# Patient Record
Sex: Female | Born: 1970 | Race: White | Hispanic: No | Marital: Married | Smoking: Former smoker
Health system: Southern US, Community
[De-identification: ages and names within clinical notes are randomized; demographics above are authoritative.]

## PROBLEM LIST (undated history)

## (undated) DIAGNOSIS — E669 Obesity, unspecified: Secondary | ICD-10-CM

## (undated) DIAGNOSIS — Q433 Congenital malformations of intestinal fixation: Secondary | ICD-10-CM

## (undated) DIAGNOSIS — N801 Endometriosis of ovary: Secondary | ICD-10-CM

## (undated) DIAGNOSIS — S83209A Unspecified tear of unspecified meniscus, current injury, unspecified knee, initial encounter: Secondary | ICD-10-CM

## (undated) DIAGNOSIS — J329 Chronic sinusitis, unspecified: Secondary | ICD-10-CM

## (undated) DIAGNOSIS — Q893 Situs inversus: Secondary | ICD-10-CM

## (undated) DIAGNOSIS — F341 Dysthymic disorder: Secondary | ICD-10-CM

## (undated) DIAGNOSIS — K219 Gastro-esophageal reflux disease without esophagitis: Secondary | ICD-10-CM

## (undated) DIAGNOSIS — T7840XA Allergy, unspecified, initial encounter: Secondary | ICD-10-CM

## (undated) DIAGNOSIS — J45909 Unspecified asthma, uncomplicated: Secondary | ICD-10-CM

## (undated) DIAGNOSIS — N80109 Endometriosis of ovary, unspecified side, unspecified depth: Secondary | ICD-10-CM

## (undated) DIAGNOSIS — G629 Polyneuropathy, unspecified: Secondary | ICD-10-CM

## (undated) HISTORY — DX: Dysthymic disorder: F34.1

## (undated) HISTORY — DX: Polyneuropathy, unspecified: G62.9

## (undated) HISTORY — DX: Unspecified asthma, uncomplicated: J45.909

## (undated) HISTORY — DX: Unspecified tear of unspecified meniscus, current injury, unspecified knee, initial encounter: S83.209A

## (undated) HISTORY — DX: Endometriosis of ovary, unspecified side, unspecified depth: N80.109

## (undated) HISTORY — DX: Gastro-esophageal reflux disease without esophagitis: K21.9

## (undated) HISTORY — DX: Obesity, unspecified: E66.9

## (undated) HISTORY — DX: Allergy, unspecified, initial encounter: T78.40XA

## (undated) HISTORY — DX: Chronic sinusitis, unspecified: J32.9

## (undated) HISTORY — DX: Situs inversus: Q89.3

## (undated) HISTORY — DX: Endometriosis of ovary: N80.1

## (undated) HISTORY — PX: NASAL SINUS SURGERY: SHX719

## (undated) HISTORY — DX: Congenital malformations of intestinal fixation: Q43.3

---

## 2004-03-10 ENCOUNTER — Inpatient Hospital Stay: Payer: Self-pay | Admitting: Obstetrics and Gynecology

## 2005-04-03 HISTORY — PX: ABDOMINAL HYSTERECTOMY: SHX81

## 2006-11-28 ENCOUNTER — Ambulatory Visit: Payer: Self-pay | Admitting: Internal Medicine

## 2007-08-12 ENCOUNTER — Ambulatory Visit: Payer: Self-pay | Admitting: Specialist

## 2008-09-22 ENCOUNTER — Emergency Department: Payer: Self-pay | Admitting: Emergency Medicine

## 2010-02-03 ENCOUNTER — Ambulatory Visit: Payer: Self-pay

## 2011-06-11 ENCOUNTER — Ambulatory Visit: Payer: Self-pay | Admitting: Internal Medicine

## 2011-08-08 ENCOUNTER — Ambulatory Visit: Payer: Self-pay

## 2011-10-28 ENCOUNTER — Ambulatory Visit: Payer: Self-pay | Admitting: Emergency Medicine

## 2012-04-13 ENCOUNTER — Emergency Department: Payer: Self-pay | Admitting: Emergency Medicine

## 2012-04-13 LAB — COMPREHENSIVE METABOLIC PANEL
Alkaline Phosphatase: 86 U/L (ref 50–136)
Anion Gap: 9 (ref 7–16)
BUN: 8 mg/dL (ref 7–18)
Calcium, Total: 8.6 mg/dL (ref 8.5–10.1)
Chloride: 107 mmol/L (ref 98–107)
Co2: 27 mmol/L (ref 21–32)
Creatinine: 0.8 mg/dL (ref 0.60–1.30)
EGFR (African American): >60
Glucose: 95 mg/dL (ref 65–99)
Osmolality: 283 (ref 275–301)
SGPT (ALT): 39 U/L (ref 12–78)
Total Protein: 6.8 g/dL (ref 6.4–8.2)

## 2012-04-13 LAB — CBC
HCT: 40.8 % (ref 35.0–47.0)
HGB: 13.9 g/dL (ref 12.0–16.0)
MCHC: 33.9 g/dL (ref 32.0–36.0)
MCV: 82 fL (ref 80–100)
Platelet: 235 10*3/uL (ref 150–440)
RBC: 5 10*6/uL (ref 3.80–5.20)
RDW: 13.2 % (ref 11.5–14.5)
WBC: 4.3 10*3/uL (ref 3.6–11.0)

## 2012-09-23 ENCOUNTER — Emergency Department: Payer: Self-pay | Admitting: Emergency Medicine

## 2013-03-20 ENCOUNTER — Ambulatory Visit: Payer: Self-pay | Admitting: Physician Assistant

## 2013-05-01 ENCOUNTER — Ambulatory Visit: Payer: Self-pay | Admitting: Physician Assistant

## 2013-10-22 ENCOUNTER — Ambulatory Visit: Payer: Self-pay | Admitting: Physician Assistant

## 2013-11-01 ENCOUNTER — Ambulatory Visit: Payer: Self-pay | Admitting: Family Medicine

## 2014-07-22 DIAGNOSIS — G43719 Chronic migraine without aura, intractable, without status migrainosus: Secondary | ICD-10-CM | POA: Insufficient documentation

## 2014-07-22 DIAGNOSIS — Z6841 Body Mass Index (BMI) 40.0 and over, adult: Secondary | ICD-10-CM

## 2014-07-22 DIAGNOSIS — M797 Fibromyalgia: Secondary | ICD-10-CM | POA: Insufficient documentation

## 2014-11-07 ENCOUNTER — Other Ambulatory Visit: Payer: Self-pay | Admitting: Unknown Physician Specialty

## 2014-11-23 DIAGNOSIS — N801 Endometriosis of ovary: Secondary | ICD-10-CM | POA: Insufficient documentation

## 2014-11-23 DIAGNOSIS — T7840XA Allergy, unspecified, initial encounter: Secondary | ICD-10-CM | POA: Insufficient documentation

## 2014-11-23 DIAGNOSIS — F341 Dysthymic disorder: Secondary | ICD-10-CM | POA: Insufficient documentation

## 2014-11-23 DIAGNOSIS — K219 Gastro-esophageal reflux disease without esophagitis: Secondary | ICD-10-CM | POA: Insufficient documentation

## 2014-11-23 DIAGNOSIS — Q893 Situs inversus: Secondary | ICD-10-CM | POA: Insufficient documentation

## 2014-11-23 DIAGNOSIS — J45909 Unspecified asthma, uncomplicated: Secondary | ICD-10-CM | POA: Insufficient documentation

## 2014-11-23 DIAGNOSIS — G629 Polyneuropathy, unspecified: Secondary | ICD-10-CM | POA: Insufficient documentation

## 2014-11-23 DIAGNOSIS — N80109 Endometriosis of ovary, unspecified side, unspecified depth: Secondary | ICD-10-CM | POA: Insufficient documentation

## 2014-11-30 ENCOUNTER — Encounter: Payer: Self-pay | Admitting: Family Medicine

## 2014-12-29 ENCOUNTER — Encounter: Payer: Self-pay | Admitting: Emergency Medicine

## 2014-12-29 ENCOUNTER — Ambulatory Visit
Admission: EM | Admit: 2014-12-29 | Discharge: 2014-12-29 | Disposition: A | Discharge status: Home / Self Care | Payer: BLUE CROSS/BLUE SHIELD | Attending: Family Medicine | Admitting: Family Medicine

## 2014-12-29 ENCOUNTER — Ambulatory Visit: Payer: BLUE CROSS/BLUE SHIELD

## 2014-12-29 DIAGNOSIS — M7552 Bursitis of left shoulder: Secondary | ICD-10-CM

## 2014-12-29 DIAGNOSIS — M7522 Bicipital tendinitis, left shoulder: Secondary | ICD-10-CM | POA: Diagnosis not present

## 2014-12-29 MED ORDER — MELOXICAM 15 MG PO TABS: 15.0000 mg | ORAL_TABLET | Freq: Every day | ORAL | Status: DC

## 2014-12-29 MED ORDER — TRAMADOL HCL 50 MG PO TABS: 50.0000 mg | ORAL_TABLET | Freq: Two times a day (BID) | ORAL | Status: DC | PRN

## 2014-12-29 MED ORDER — KETOROLAC TROMETHAMINE 60 MG/2ML IM SOLN
60.0000 mg | Freq: Once | INTRAMUSCULAR | Status: AC
  Administered 2014-12-29: 60 mg via INTRAMUSCULAR

## 2014-12-29 NOTE — ED Provider Notes (Signed)
CSN: [id number]     Arrival date & time 12/29/14  1722 History   First MD Initiated Contact with Patient 12/29/14 1751     Chief Complaint  Patient presents with  . Shoulder Pain     Patient reports waking up on Saturday morning with some right shoulder pain and discomfort. She went to a wedding and later socialized with friends and play pool. Saturday the left shoulder was aching and throbbing and she was in severe pain. She states Sunday night she was tempted to go to the emergency room and suffered through Monday and finally came in today. Continue ibuprofen but hasn't helped. She is receiving her back is vascular better now. (Consider location/radiation/quality/duration/timing/severity/associated sxs/prior Treatment) Patient is a 44 y.o. female presenting with shoulder pain. The history is provided by the patient. No language interpreter was used.  Shoulder Pain Location:  Shoulder (scapular pain as well) Time since incident: 4. Injury: no   Shoulder location:  L shoulder Pain details:    Quality:  Aching, burning, shooting, tearing, throbbing and sharp   Radiates to:  L shoulder and back   Severity:  Moderate   Onset quality:  Gradual   Duration:  4 days   Timing:  Constant   Progression:  Worsening Chronicity:  New Handedness:  Right-handed Dislocation: no   Foreign body present:  No foreign bodies Prior injury to area:  No Relieved by:  Nothing Ineffective treatments:  NSAIDs Associated symptoms: neck pain   Associated symptoms: no back pain   Risk factors: no concern for non-accidental trauma     Past Medical History  Diagnosis Date  . Ovarian endometriosis   . 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 Allergy   . Asthma   . Obesity   . Dysthymic disorder   . Peripheral neuropathy   . GERD (gastroesophageal reflux disease)   . Chronic sinusitis    Past Surgical History  Procedure Laterality Date  . Nasal sinus surgery  1996 and 1999    x 2  .  Abdominal hysterectomy  2007    complete due to endometriosis   Family History  Problem Relation Age of Onset  . Fibromyalgia Mother   . Diabetes Maternal Aunt   . Diabetes Paternal Aunt    Social History  Substance Use Topics  . Smoking status: Former Smoker    Types: Cigarettes    Quit date: 04/04/2003  . Smokeless tobacco: Never Used  . Alcohol Use: No   OB History    No data available     Review of Systems  Constitutional: Positive for activity change.  Respiratory: Negative for shortness of breath.   Cardiovascular: Negative for chest pain.  Musculoskeletal: Positive for myalgias, neck pain and neck stiffness. Negative for back pain and gait problem.  All other systems reviewed and are negative.   Allergies  Codeine and Naproxen  Home Medications   Prior to Admission medications   Medication Sig Start Date End Date Taking? Authorizing Provider  acyclovir (ZOVIRAX) 200 MG capsule Take 200 mg by mouth 2 (two) times daily.    Historical Provider, MD  cyclobenzaprine (FLEXERIL) 10 MG tablet Take 10 mg by mouth every 8 (eight) hours as needed for muscle spasms.    Historical Provider, MD  gabapentin (NEURONTIN) 300 MG capsule Take 300 mg by mouth 2 (two) times daily.    Historical Provider, MD  meloxicam (MOBIC) 15 MG tablet Take 1 tablet (15 mg total)  by mouth daily. 12/29/14   Frederich Cha, MD  pantoprazole (PROTONIX) 40 MG tablet Take 40 mg by mouth daily.    Historical Provider, MD  PROAIR HFA 108 (90 BASE) MCG/ACT inhaler INHALE 2 PUFFS BY MOUTH EVERY 6 HOURS 11/09/14   Kathrine Haddock, NP  traMADol (ULTRAM) 50 MG tablet Take 1 tablet (50 mg total) by mouth every 12 (twelve) hours as needed for moderate pain (May cause sedation when taking mainly at night if needed). 12/29/14   Frederich Cha, MD   Meds Ordered and Administered this Visit   Medications  ketorolac (TORADOL) injection 60 mg (60 mg Intramuscular Given 12/29/14 1845)    BP 132/66 mmHg  Pulse 88  Temp(Src)  97.9 F (36.6 C) (Tympanic)  Resp 16  Ht 5\' 5"  (1.651 m)  Wt 320 lb (145.151 kg)  BMI 53.25 kg/m2  SpO2 97% No data found.   Physical Exam  Constitutional: She is oriented to person, place, and time. She appears well-developed and well-nourished.  Obese white female  HENT:  Head: Normocephalic and atraumatic.  Eyes: Conjunctivae are normal. Pupils are equal, round, and reactive to light.  Neck: Normal range of motion. Neck supple.  Musculoskeletal: She exhibits tenderness.       Left shoulder: She exhibits tenderness, bony tenderness and swelling. She exhibits normal range of motion, no deformity and no laceration.       Left upper arm: Normal. She exhibits no tenderness, no bony tenderness, no swelling, no edema and no deformity.       Arms: Patient is able to raise the left arm above her head exhibited do the reverse beer can hold against resistance. She does no sniffing tenderness over the trapezius muscle. Patient has tenderness over the left scapula consistent with left scapular bursitis but most the pain and tenderness is over the anterior shoulder over the biceps tendon consistent with bicipital tendinitis.  Neurological: She is alert and oriented to person, place, and time. No cranial nerve deficit.  Skin: Skin is warm and dry.  Psychiatric: She has a normal mood and affect. Her behavior is normal.  Vitals reviewed.   ED Course  Procedures (including critical care time)  Labs Review Labs Reviewed - No data to display  Imaging Review Dg Shoulder Left  12/29/2014   CLINICAL DATA:  Pain radiating into arm region.  EXAM: LEFT SHOULDER - 2+ VIEW  COMPARISON:  None.  FINDINGS: Frontal, Y scapular, and axillary images were obtained. No fracture or dislocation. There is mild generalized osteoarthritic change. No erosive change or intra-articular calcification.  IMPRESSION: Mild generalized osteoarthritic change.  No fracture or dislocation.   Electronically Signed   By: Lowella Grip III M.D.   On: 12/29/2014 19:03     Visual Acuity Review  Right Eye Distance:   Left Eye Distance:   Bilateral Distance:    Right Eye Near:   Left Eye Near:    Bilateral Near:         MDM   1. Biceps tendinitis, left   2. Bursitis, scapulohumeral, left     Patient has bicipital tendinitis with left scapular bursitis. Will x-ray her shoulder to make sure we are missing anything else. 60 toradol be given to help with the discomfort here. We'll place her on Mobic 15 mg and tramadol to use pain at night to help her sleep. Work note for tomorrow offered patient as well. I've explained patient that she may need to have injection of the left bicep  tendon or the bursitis with steroid-dependent in 2-4 weeks she's not improving. She asked for sling alert and use a sling and put ice on it but warn her would not use the sling for more than 2-3 days to reduce chance of frozen shoulder syndrome occurring.  Frederich Cha, MD 12/29/14 [phone]

## 2014-12-29 NOTE — Discharge Instructions (Signed)
Bicipital Tendonitis Bicipital tendonitis refers to redness, soreness, and swelling (inflammation) or irritation of the bicep tendon. The biceps muscle is located between the elbow and shoulder of the inner arm. The tendon heads, similar to pieces of rope, connect the bicep muscle to the shoulder socket. They are called short head and long head tendons. When tendonitis occurs, the long head tendon is inflamed and swollen, and may be thickened or partially torn.  Bicipital tendonitis can occur with other problems as well, such as arthritis in the shoulder or acromioclavicular joints, tears in the tendons, or other rotator cuff problems.  CAUSES  Overuse of of the arms for overhead activities is the major cause of tendonitis. Many athletes, such as swimmers, baseball players, and tennis players are prone to bicipital tendonitis. Jobs that require manual labor or routine chores, especially chores involving overhead activities can result in overuse and tendonitis. SYMPTOMS Symptoms may include:  Pain in and around the front of the shoulder. Pain may be worse with overhead motion.  Pain or aching that radiates down the arm.  Clicking or shifting sensations in the shoulder. DIAGNOSIS Your caregiver may perform the following:  Physical exam and tests of the biceps and shoulder to observe range of motion, strength, and stability.  X-rays or magnetic resonance imaging (MRI) to confirm the diagnosis. In most common cases, these tests are not necessary. Since other problems may exist in the shoulder or rotator cuff, additional tests may be recommended. TREATMENT Treatment may include the following:  Medications  Your caregiver may prescribe over-the-counter pain relievers.  Steroid injections, such as cortisone, may be recommended. These may help to reduce inflammation and pain.  Physical Therapy - Your caregiver may recommend gentle exercises with the arm. These can help restore strength and range  of motion. They may be done at home or with a physical therapist's supervision and input.  Surgery - Arthroscopic or open surgery sometimes is necessary. Surgery may include:  Reattachment or repair of the tendon at the shoulder socket.  Removal of the damaged section of the tendon.  Anchoring the tendon to a different area of the shoulder (tenodesis). HOME CARE INSTRUCTIONS   Avoid overhead motion of the affected arm or any other motion that causes pain.  Take medication for pain as directed. Do not take these for more than 3 weeks, unless directed to do so by your caregiver.  Ice the affected area for 20 minutes at a time, 3-4 times per day. Place a towel on the skin over the painful area and the ice or cold pack over the towel. Do not place ice directly on the skin.  Perform gentle exercises at home as directed. These will increase strength and flexibility. PREVENTION  Modify your activities as much as possible to protect your arm. A physical therapist or sports medicine physician can help you understand options for safe motion.  Avoid repetitive overhead pulling, lifting, reaching, and throwing until your caregiver tells you it is ok to resume these activities. SEEK MEDICAL CARE IF:  Your pain worsens.  You have difficulty moving the affected arm.  You have trouble performing any of the self-care instructions. MAKE SURE YOU:   Understand these instructions.  Will watch your condition.  Will get help right away if you are not doing well or get worse. Document Released: 04/22/2010 Document Revised: 06/12/2011 Document Reviewed: 04/22/2010 [facility] Patient Information 2015 [place], [place]. This information is not intended to replace advice given to you by your health care provider.  Make sure you discuss any questions you have with your health care provider.  Bursitis Bursitis is when the fluid-filled sac (bursa) that covers and protects a joint gets puffy and irritated. The  elbow, shoulder, hip, and knee joints are most often affected. HOME CARE  Put ice on the area.  Put ice in a plastic bag.  Place a towel between your skin and the bag.  Leave the ice on for 15-20 minutes, 03-04 times a day.  Put the joint through a full range of motion 4 times a day. Rest the injured joint at other times. When you have less pain, begin slow movements and usual activities.  Only take medicine as told by your doctor.  Follow up with your doctor. Any delay in care could stop the bursitis from healing. This could cause long-term pain. GET HELP RIGHT AWAY IF:   You have more pain with treatment.  You have a temperature by mouth above 102 F (38.9 C), not controlled by medicine.  You have heat and irritation over the fluid-filled sac. MAKE SURE YOU:   Understand these instructions.  Will watch your condition.  Will get help right away if you are not doing well or get worse. Document Released: 09/07/2009 Document Revised: 06/12/2011 Document Reviewed: 06/09/2013 [facility] Patient Information 2015 [place], [place]. This information is not intended to replace advice given to you by your health care provider. Make sure you discuss any questions you have with your health care provider.

## 2014-12-29 NOTE — ED Notes (Signed)
Left shoulder pain for 4 days.

## 2015-01-12 ENCOUNTER — Ambulatory Visit (INDEPENDENT_AMBULATORY_CARE_PROVIDER_SITE_OTHER): Payer: BLUE CROSS/BLUE SHIELD | Admitting: Family Medicine

## 2015-01-12 ENCOUNTER — Encounter: Payer: Self-pay | Admitting: Family Medicine

## 2015-01-12 VITALS — BP 129/86 | HR 78 | Temp 97.7°F | Ht 64.5 in | Wt 327.0 lb

## 2015-01-12 DIAGNOSIS — F411 Generalized anxiety disorder: Secondary | ICD-10-CM | POA: Diagnosis not present

## 2015-01-12 DIAGNOSIS — Z1239 Encounter for other screening for malignant neoplasm of breast: Secondary | ICD-10-CM

## 2015-01-12 DIAGNOSIS — Z9989 Dependence on other enabling machines and devices: Secondary | ICD-10-CM

## 2015-01-12 DIAGNOSIS — Z23 Encounter for immunization: Secondary | ICD-10-CM | POA: Diagnosis not present

## 2015-01-12 DIAGNOSIS — Z0000 Encounter for general adult medical examination without abnormal findings: Secondary | ICD-10-CM | POA: Insufficient documentation

## 2015-01-12 DIAGNOSIS — Z114 Encounter for screening for human immunodeficiency virus [HIV]: Secondary | ICD-10-CM

## 2015-01-12 DIAGNOSIS — R030 Elevated blood-pressure reading, without diagnosis of hypertension: Secondary | ICD-10-CM | POA: Insufficient documentation

## 2015-01-12 DIAGNOSIS — Z1159 Encounter for screening for other viral diseases: Secondary | ICD-10-CM | POA: Diagnosis not present

## 2015-01-12 DIAGNOSIS — G4733 Obstructive sleep apnea (adult) (pediatric): Secondary | ICD-10-CM | POA: Insufficient documentation

## 2015-01-12 MED ORDER — ESCITALOPRAM OXALATE 10 MG PO TABS: 10.0000 mg | ORAL_TABLET | Freq: Every day | ORAL | Status: DC

## 2015-01-12 NOTE — Assessment & Plan Note (Signed)
CBE done today; encouraged monthly SBE; mammograms every 1-2 years

## 2015-01-12 NOTE — Assessment & Plan Note (Signed)
USPSTF grade A and B recommendations reviewed with patient; age-appropriate recommendations, preventive care, screening tests, etc discussed and encouraged; healthy living encouraged; see AVS for patient education given to patient  

## 2015-01-12 NOTE — Patient Instructions (Addendum)
Try the DASH guidelines Start the new medicine to help with anxiety Start working with a counselor Let me know if you want a hearing evaluation Check out the information at familydoctor.org entitled "What It Takes to Lose Weight" Try to lose between 1-2 pounds per week by taking in fewer calories and burning off more calories You can succeed by limiting portions, limiting foods dense in calories and fat, becoming more active, and drinking 8 glasses of water a day (64 ounces) Don't skip meals, especially breakfast, as skipping meals may alter your metabolism Do not use over-the-counter weight loss pills or gimmicks that claim rapid weight loss A healthy BMI (or body mass index) is between 18.5 and 24.9 You can calculate your ideal BMI at the [place] website [employer]fr Return in 4 weeks to see me, but call sooner if needed Return in 12+ months for your next physical   DASH Eating Plan DASH stands for "Dietary Approaches to Stop Hypertension." The DASH eating plan is a healthy eating plan that has been shown to reduce high blood pressure (hypertension). Additional health benefits may include reducing the risk of type 2 diabetes mellitus, heart disease, and stroke. The DASH eating plan may also help with weight loss. WHAT DO I NEED TO KNOW ABOUT THE DASH EATING PLAN? For the DASH eating plan, you will follow these general guidelines:  Choose foods with a percent daily value for sodium of less than 5% (as listed on the food label).  Use salt-free seasonings or herbs instead of table salt or sea salt.  Check with your health care provider or pharmacist before using salt substitutes.  Eat lower-sodium products, often labeled as "lower sodium" or "no salt added."  Eat fresh foods.  Eat more vegetables, fruits, and low-fat dairy products.  Choose whole grains. Look for the word "whole" as the first word in the ingredient list.  Choose fish  and skinless chicken or [place] more often than red meat. Limit fish, poultry, and meat to 6 oz (170 g) each day.  Limit sweets, desserts, sugars, and sugary drinks.  Choose heart-healthy fats.  Limit cheese to 1 oz (28 g) per day.  Eat more home-cooked food and less restaurant, buffet, and fast food.  Limit fried foods.  Cook foods using methods other than frying.  Limit canned vegetables. If you do use them, rinse them well to decrease the sodium.  When eating at a restaurant, ask that your food be prepared with less salt, or no salt if possible. WHAT FOODS CAN I EAT? Seek help from a dietitian for individual calorie needs. Grains Whole grain or whole wheat bread. Brown rice. Whole grain or whole wheat pasta. Quinoa, bulgur, and whole grain cereals. Low-sodium cereals. Corn or whole wheat flour tortillas. Whole grain cornbread. Whole grain crackers. Low-sodium crackers. Vegetables Fresh or frozen vegetables (raw, steamed, roasted, or grilled). Low-sodium or reduced-sodium tomato and vegetable juices. Low-sodium or reduced-sodium tomato sauce and paste. Low-sodium or reduced-sodium canned vegetables.  Fruits All fresh, canned (in natural juice), or frozen fruits. Meat and Other Protein Products Ground beef (85% or leaner), grass-fed beef, or beef trimmed of fat. Skinless chicken or [place]. Ground chicken or [place]. Pork trimmed of fat. All fish and seafood. Eggs. Dried beans, peas, or lentils. Unsalted nuts and seeds. Unsalted canned beans. Dairy Low-fat dairy products, such as skim or 1% milk, 2% or reduced-fat cheeses, low-fat ricotta or cottage cheese, or plain low-fat yogurt. Low-sodium or reduced-sodium cheeses. Fats and Oils Tub margarines without  trans fats. Light or reduced-fat mayonnaise and salad dressings (reduced sodium). Avocado. Safflower, olive, or canola oils. Natural peanut or almond butter. Other Unsalted popcorn and pretzels. The items listed above may not be a  complete list of recommended foods or beverages. Contact your dietitian for more options. WHAT FOODS ARE NOT RECOMMENDED? Grains White bread. White pasta. White rice. Refined cornbread. Bagels and croissants. Crackers that contain trans fat. Vegetables Creamed or fried vegetables. Vegetables in a cheese sauce. Regular canned vegetables. Regular canned tomato sauce and paste. Regular tomato and vegetable juices. Fruits Dried fruits. Canned fruit in light or heavy syrup. Fruit juice. Meat and Other Protein Products Fatty cuts of meat. Ribs, chicken wings, bacon, sausage, bologna, salami, chitterlings, fatback, hot dogs, bratwurst, and packaged luncheon meats. Salted nuts and seeds. Canned beans with salt. Dairy Whole or 2% milk, cream, half-and-half, and cream cheese. Whole-fat or sweetened yogurt. Full-fat cheeses or blue cheese. Nondairy creamers and whipped toppings. Processed cheese, cheese spreads, or cheese curds. Condiments Onion and garlic salt, seasoned salt, table salt, and sea salt. Canned and packaged gravies. Worcestershire sauce. Tartar sauce. Barbecue sauce. Teriyaki sauce. Soy sauce, including reduced sodium. Steak sauce. Fish sauce. Oyster sauce. Cocktail sauce. Horseradish. Ketchup and mustard. Meat flavorings and tenderizers. Bouillon cubes. Hot sauce. Tabasco sauce. Marinades. Taco seasonings. Relishes. Fats and Oils Butter, stick margarine, lard, shortening, ghee, and bacon fat. Coconut, palm kernel, or palm oils. Regular salad dressings. Other Pickles and olives. Salted popcorn and pretzels. The items listed above may not be a complete list of foods and beverages to avoid. Contact your dietitian for more information. WHERE CAN I FIND MORE INFORMATION? National Heart, Lung, and Blood Institute: travelstabloid.com   This information is not intended to replace advice given to you by your health care provider. Make sure you discuss any questions  you have with your health care provider.   Document Released: 03/09/2011 Document Revised: 04/10/2014 Document Reviewed: 01/22/2013 Elsevier Interactive Patient Education 2016 [place] Maintenance, Female Adopting a healthy lifestyle and getting preventive care can go a long way to promote health and wellness. Talk with your health care provider about what schedule of regular examinations is right for you. This is a good chance for you to check in with your provider about disease prevention and staying healthy. In between checkups, there are plenty of things you can do on your own. Experts have done a lot of research about which lifestyle changes and preventive measures are most likely to keep you healthy. Ask your health care provider for more information. WEIGHT AND DIET  Eat a healthy diet  Be sure to include plenty of vegetables, fruits, low-fat dairy products, and lean protein.  Do not eat a lot of foods high in solid fats, added sugars, or salt.  Get regular exercise. This is one of the most important things you can do for your health.  Most adults should exercise for at least 150 minutes each week. The exercise should increase your heart rate and make you sweat (moderate-intensity exercise).  Most adults should also do strengthening exercises at least twice a week. This is in addition to the moderate-intensity exercise.  Maintain a healthy weight  Body mass index (BMI) is a measurement that can be used to identify possible weight problems. It estimates body fat based on height and weight. Your health care provider can help determine your BMI and help you achieve or maintain a healthy weight.  For females 37 years of age and older:  A BMI below 18.5 is considered underweight.  A BMI of 18.5 to 24.9 is normal.  A BMI of 25 to 29.9 is considered overweight.  A BMI of 30 and above is considered obese.  Watch levels of cholesterol and blood lipids  You should  start having your blood tested for lipids and cholesterol at 42 years of age, then have this test every 5 years.  You may need to have your cholesterol levels checked more often if:  Your lipid or cholesterol levels are high.  You are older than 42 years of age.  You are at high risk for heart disease.  CANCER SCREENING   Lung Cancer  Lung cancer screening is recommended for adults 83-52 years old who are at high risk for lung cancer because of a history of smoking.  A yearly low-dose CT scan of the lungs is recommended for people who:  Currently smoke.  Have quit within the past 15 years.  Have at least a 30-pack-year history of smoking. A pack year is smoking an average of one pack of cigarettes a day for 1 year.  Yearly screening should continue until it has been 15 years since you quit.  Yearly screening should stop if you develop a health problem that would prevent you from having lung cancer treatment.  Breast Cancer  Practice breast self-awareness. This means understanding how your breasts normally appear and feel.  It also means doing regular breast self-exams. Let your health care provider know about any changes, no matter how small.  If you are in your 20s or 30s, you should have a clinical breast exam (CBE) by a health care provider every 1-3 years as part of a regular health exam.  If you are 29 or older, have a CBE every year. Also consider having a breast X-ray (mammogram) every year.  If you have a family history of breast cancer, talk to your health care provider about genetic screening.  If you are at high risk for breast cancer, talk to your health care provider about having an MRI and a mammogram every year.  Breast cancer gene (BRCA) assessment is recommended for women who have family members with BRCA-related cancers. BRCA-related cancers include:  Breast.  Ovarian.  Tubal.  Peritoneal cancers.  Results of the assessment will determine the  need for genetic counseling and BRCA1 and BRCA2 testing. Cervical Cancer Your health care provider may recommend that you be screened regularly for cancer of the pelvic organs (ovaries, uterus, and vagina). This screening involves a pelvic examination, including checking for microscopic changes to the surface of your cervix (Pap test). You may be encouraged to have this screening done every 3 years, beginning at age 32.  For women ages 62-65, health care providers may recommend pelvic exams and Pap testing every 3 years, or they may recommend the Pap and pelvic exam, combined with testing for human papilloma virus (HPV), every 5 years. Some types of HPV increase your risk of cervical cancer. Testing for HPV may also be done on women of any age with unclear Pap test results.  Other health care providers may not recommend any screening for nonpregnant women who are considered low risk for pelvic cancer and who do not have symptoms. Ask your health care provider if a screening pelvic exam is right for you.  If you have had past treatment for cervical cancer or a condition that could lead to cancer, you need Pap tests and screening for cancer for at least  20 years after your treatment. If Pap tests have been discontinued, your risk factors (such as having a new sexual partner) need to be reassessed to determine if screening should resume. Some women have medical problems that increase the chance of getting cervical cancer. In these cases, your health care provider may recommend more frequent screening and Pap tests. Colorectal Cancer  This type of cancer can be detected and often prevented.  Routine colorectal cancer screening usually begins at 42 years of age and continues through 42 years of age.  Your health care provider may recommend screening at an earlier age if you have risk factors for colon cancer.  Your health care provider may also recommend using home test kits to check for hidden blood in  the stool.  A small camera at the end of a tube can be used to examine your colon directly (sigmoidoscopy or colonoscopy). This is done to check for the earliest forms of colorectal cancer.  Routine screening usually begins at age 43.  Direct examination of the colon should be repeated every 5-10 years through 42 years of age. However, you may need to be screened more often if early forms of precancerous polyps or small growths are found. Skin Cancer  Check your skin from head to toe regularly.  Tell your health care provider about any new moles or changes in moles, especially if there is a change in a mole's shape or color.  Also tell your health care provider if you have a mole that is larger than the size of a pencil eraser.  Always use sunscreen. Apply sunscreen liberally and repeatedly throughout the day.  Protect yourself by wearing long sleeves, pants, a wide-brimmed hat, and sunglasses whenever you are outside. HEART DISEASE, DIABETES, AND HIGH BLOOD PRESSURE   High blood pressure causes heart disease and increases the risk of stroke. High blood pressure is more likely to develop in:  People who have blood pressure in the high end of the normal range (130-139/85-89 mm Hg).  People who are overweight or obese.  People who are African American.  If you are 60-64 years of age, have your blood pressure checked every 3-5 years. If you are 53 years of age or older, have your blood pressure checked every year. You should have your blood pressure measured twice--once when you are at a hospital or clinic, and once when you are not at a hospital or clinic. Record the average of the two measurements. To check your blood pressure when you are not at a hospital or clinic, you can use:  An automated blood pressure machine at a pharmacy.  A home blood pressure monitor.  If you are between 59 years and 4 years old, ask your health care provider if you should take aspirin to prevent  strokes.  Have regular diabetes screenings. This involves taking a blood sample to check your fasting blood sugar level.  If you are at a normal weight and have a low risk for diabetes, have this test once every three years after 42 years of age.  If you are overweight and have a high risk for diabetes, consider being tested at a younger age or more often. PREVENTING INFECTION  Hepatitis B  If you have a higher risk for hepatitis B, you should be screened for this virus. You are considered at high risk for hepatitis B if:  You were born in a country where hepatitis B is common. Ask your health care provider which countries are  considered high risk.  Your parents were born in a high-risk country, and you have not been immunized against hepatitis B (hepatitis B vaccine).  You have HIV or AIDS.  You use needles to inject street drugs.  You live with someone who has hepatitis B.  You have had sex with someone who has hepatitis B.  You get hemodialysis treatment.  You take certain medicines for conditions, including cancer, organ transplantation, and autoimmune conditions. Hepatitis C  Blood testing is recommended for:  Everyone born from [birth year] through 1963.  Anyone with known risk factors for hepatitis C. Sexually transmitted infections (STIs)  You should be screened for sexually transmitted infections (STIs) including gonorrhea and chlamydia if:  You are sexually active and are younger than 42 years of age.  You are older than 42 years of age and your health care provider tells you that you are at risk for this type of infection.  Your sexual activity has changed since you were last screened and you are at an increased risk for chlamydia or gonorrhea. Ask your health care provider if you are at risk.  If you do not have HIV, but are at risk, it may be recommended that you take a prescription medicine daily to prevent HIV infection. This is called pre-exposure prophylaxis  (PrEP). You are considered at risk if:  You are sexually active and do not regularly use condoms or know the HIV status of your partner(s).  You take drugs by injection.  You are sexually active with a partner who has HIV. Talk with your health care provider about whether you are at high risk of being infected with HIV. If you choose to begin PrEP, you should first be tested for HIV. You should then be tested every 3 months for as long as you are taking PrEP.  PREGNANCY   If you are premenopausal and you may become pregnant, ask your health care provider about preconception counseling.  If you may become pregnant, take 400 to 800 micrograms (mcg) of folic acid every day.  If you want to prevent pregnancy, talk to your health care provider about birth control (contraception). OSTEOPOROSIS AND MENOPAUSE   Osteoporosis is a disease in which the bones lose minerals and strength with aging. This can result in serious bone fractures. Your risk for osteoporosis can be identified using a bone density scan.  If you are 59 years of age or older, or if you are at risk for osteoporosis and fractures, ask your health care provider if you should be screened.  Ask your health care provider whether you should take a calcium or vitamin D supplement to lower your risk for osteoporosis.  Menopause may have certain physical symptoms and risks.  Hormone replacement therapy may reduce some of these symptoms and risks. Talk to your health care provider about whether hormone replacement therapy is right for you.  HOME CARE INSTRUCTIONS   Schedule regular health, dental, and eye exams.  Stay current with your immunizations.   Do not use any tobacco products including cigarettes, chewing tobacco, or electronic cigarettes.  If you are pregnant, do not drink alcohol.  If you are breastfeeding, limit how much and how often you drink alcohol.  Limit alcohol intake to no more than 1 drink per day for  nonpregnant women. One drink equals 12 ounces of beer, 5 ounces of wine, or 1 ounces of hard liquor.  Do not use street drugs.  Do not share needles.  Ask your health care  provider for help if you need support or information about quitting drugs.  Tell your health care provider if you often feel depressed.  Tell your health care provider if you have ever been abused or do not feel safe at home.   This information is not intended to replace advice given to you by your health care provider. Make sure you discuss any questions you have with your health care provider.   Document Released: 10/03/2010 Document Revised: 04/10/2014 Document Reviewed: 02/19/2013 Elsevier Interactive Patient Education [employer].

## 2015-01-12 NOTE — Progress Notes (Signed)
Patient ID: Jamie Hardin, female   DOB: 14-Dec-1970, 44 y.o.   MRN: [id number]   Subjective:   Jamie Hardin is a 44 y.o. female here for a complete physical exam  Interim issues since last visit: none reported  USPSTF grade A and B recommendations: Alcohol: rare social Depression screen [facility] 2/9 01/12/2015  Decreased Interest 0  Down, Depressed, Hopeless 0  PHQ - 2 Score 0  HTN: prehypertension; never taken medicine Obesity: just an adult problem; checked her thyroid once and it was abnormal Lipids and glucose: fasting today Abuse: not at all Cervical cancer: n/a Colorectal cancer: no family hx, already had colonoscopy this year Breast cancer: no fam hx, last was 2+ years ago HIV, hepatitis B, hepatitis C: check today Lung cancer: not current smoker, no chest CT Skin cancer: no tanning beds, occasional sun exposure, not using sun screen Diet: typical American; I struggle Exercise: started working with trainer, walk around block 3x a week; sedentary job  She has had anxiety attacks when she was in grade and high school; now she has them rarely, not every day, but still gets anxiety attacks; mostly when lack of sleep; never addressed them; she did see a counselor and took Elavil, amitriptyline; a few days every week; does avoid public events and crowds; she has a problem with public speaking; heart will pound and race when she gets anxious, gets sweaty palms; she would actually pass out and that's been happening since she was a child; like being terrified of needles; generalized problems  Past Medical History  Diagnosis Date  . Ovarian endometriosis   . 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 Allergy   . Asthma   . Obesity   . Dysthymic disorder   . Peripheral neuropathy ([place])   . GERD (gastroesophageal reflux disease)   . Chronic sinusitis    Past Surgical History  Procedure Laterality Date  . Nasal sinus surgery  1996 and 1999    x 2  . Abdominal  hysterectomy  2007    complete due to endometriosis   Family History  Problem Relation Age of Onset  . Fibromyalgia Mother   . Diabetes Maternal Aunt   . Diabetes Paternal Aunt   . Cancer Father     prostate  . Heart disease Neg Hx   . Hypertension Neg Hx   . Hyperlipidemia Neg Hx   . Stroke Neg Hx   . COPD Neg Hx    Social History  Substance Use Topics  . Smoking status: Former Smoker -- 1.00 packs/day for 20 years    Types: Cigarettes    Quit date: 04/04/2003  . Smokeless tobacco: Never Used  . Alcohol Use: No   Review of Systems  Constitutional: Negative for unexpected weight change.  HENT: Positive for hearing loss (hearing issues, low sounds, man's voice on phone). Negative for mouth sores and sore throat.   Eyes: Negative for visual disturbance.  Respiratory: Negative for cough.   Cardiovascular: Negative for chest pain.  Gastrointestinal: Negative for blood in stool.  Endocrine: Positive for heat intolerance, polydipsia and polyuria (up and down). Negative for cold intolerance.  Genitourinary: Negative for dysuria, hematuria and pelvic pain.  Musculoskeletal: Negative for joint swelling.  Skin:       No worrisome moles  Allergic/Immunologic: Negative for food allergies.  Neurological: Negative for tremors.  Hematological: Positive for adenopathy (glands in the neck will swell with sore throat 5x this year, always  goes back to normal).  Psychiatric/Behavioral: The patient is nervous/anxious.     Objective:   Filed Vitals:   01/12/15 0807  BP: 129/86  Pulse: 78  Temp: 97.7 F (36.5 C)  Height: 5' 4.5" (1.638 m)  Weight: 327 lb (148.326 kg)  SpO2: 98%   Body mass index is 55.28 kg/(m^2). Wt Readings from Last 3 Encounters:  01/12/15 327 lb (148.326 kg)  12/29/14 320 lb (145.151 kg)  06/26/14 318 lb (144.244 kg)   Physical Exam  Constitutional: She appears well-developed and well-nourished.  Morbidly obese  HENT:  Head: Normocephalic and atraumatic.   Right Ear: Hearing, tympanic membrane, external ear and ear canal normal.  Left Ear: Hearing, tympanic membrane, external ear and ear canal normal.  Eyes: Conjunctivae and EOM are normal. Right eye exhibits no hordeolum. Left eye exhibits no hordeolum. No scleral icterus.  Neck: Carotid bruit is not present. No thyromegaly present.  Cardiovascular: Normal rate, regular rhythm, S1 normal, S2 normal and normal heart sounds.   No extrasystoles are present.  Pulmonary/Chest: Effort normal and breath sounds normal. No respiratory distress. Right breast exhibits no inverted nipple, no mass, no nipple discharge, no skin change and no tenderness. Left breast exhibits no inverted nipple, no mass, no nipple discharge, no skin change and no tenderness. Breasts are symmetrical.  Abdominal: Soft. Normal appearance and bowel sounds are normal. She exhibits no distension, no abdominal bruit, no pulsatile midline mass and no mass. There is no hepatosplenomegaly. There is no tenderness. No hernia.  Musculoskeletal: Normal range of motion. She exhibits no edema.  Lymphadenopathy:       Head (right side): No submandibular adenopathy present.       Head (left side): No submandibular adenopathy present.    She has no cervical adenopathy.    She has no axillary adenopathy.  Neurological: She is alert. She displays no tremor. No cranial nerve deficit. She exhibits normal muscle tone. Gait normal.  Patellar tendon reflexes trace  Skin: Skin is warm and dry. No bruising and no ecchymosis noted. No cyanosis. No pallor.  Psychiatric: Her speech is normal and behavior is normal. Thought content normal. Her mood appears not anxious. She does not exhibit a depressed mood.    Assessment/Plan:   Problem List Items Addressed This Visit      Respiratory   Obstructive sleep apnea on CPAP    Using CPAP, managed by another provider        Other   Morbid obesity ([place])    Encouraged weight loss; see AVS; discussed  possibility that childhood emotional trauma is linked to morbid obesity, work with counselor; consider standing desk or treadmill desk at work      Preventative health care - Primary    USPSTF grade A and B recommendations reviewed with patient; age-appropriate recommendations, preventive care, screening tests, etc discussed and encouraged; healthy living encouraged; see AVS for patient education given to patient      Relevant Orders   CBC with Differential/Platelet   Lipid Panel w/o Chol/HDL Ratio   TSH   Comprehensive metabolic panel   Breast cancer screening    CBE done today; encouraged monthly SBE; mammograms every 1-2 years      Relevant Orders   MM DIGITAL SCREENING BILATERAL   GAD (generalized anxiety disorder)    List of counselors given; suggested CBT; start medicine; call before follow-up in 4 weeks if any side effects or problems with the medicine      Screening for HIV (  human immunodeficiency virus)   Relevant Orders   HIV antibody   Need for hepatitis B screening test   Relevant Orders   Hepatitis B surface antigen   Need for hepatitis C screening test   Relevant Orders   Hepatitis C antibody       Meds ordered this encounter  Medications  . baclofen (LIORESAL) 10 MG tablet    Sig: Take 10 mg by mouth 2 (two) times daily as needed.  . mometasone (NASONEX) 50 MCG/ACT nasal spray    Sig: Place into the nose.  Marland Kitchen omeprazole (PRILOSEC) 20 MG capsule    Sig: Take 20 mg by mouth daily.    Refill:  1  . escitalopram (LEXAPRO) 10 MG tablet    Sig: Take 1 tablet (10 mg total) by mouth daily.    Dispense:  30 tablet    Refill:  0   Orders Placed This Encounter  Procedures  . MM DIGITAL SCREENING BILATERAL    Standing Status: Future     Number of Occurrences:      Standing Expiration Date: 01/12/2016    Order Specific Question:  Reason for Exam (SYMPTOM  OR DIAGNOSIS REQUIRED)    Answer:  screening    Order Specific Question:  Is the patient pregnant?     Answer:  No    Order Specific Question:  Preferred imaging location?    Answer:  External  . Flu Vaccine QUAD 36+ mos IM  . CBC with Differential/Platelet  . Lipid Panel w/o Chol/HDL Ratio    Order Specific Question:  Has the patient fasted?    Answer:  Yes  . TSH  . Comprehensive metabolic panel    Order Specific Question:  Has the patient fasted?    Answer:  Yes  . HIV antibody    Standing Status: Future     Number of Occurrences: 1     Standing Expiration Date: 01/12/2016  . Hepatitis C antibody    Standing Status: Future     Number of Occurrences: 1     Standing Expiration Date: 01/12/2016  . Hepatitis B surface antigen    Standing Status: Future     Number of Occurrences: 1     Standing Expiration Date: 01/12/2016    Follow up plan: Return in about 4 weeks (around 02/09/2015) for follow-up with Dr. Sanda Klein, 1 year complete physical.  An after-visit summary was printed and given to the patient at [place].  Please see the patient instructions which may contain other information and recommendations beyond what is mentioned above in the assessment and plan.

## 2015-01-12 NOTE — Assessment & Plan Note (Signed)
List of counselors given; suggested CBT; start medicine; call before follow-up in 4 weeks if any side effects or problems with the medicine

## 2015-01-12 NOTE — Assessment & Plan Note (Signed)
Using CPAP, managed by another provider

## 2015-01-12 NOTE — Assessment & Plan Note (Addendum)
Encouraged weight loss; see AVS; discussed possibility that childhood emotional trauma is linked to morbid obesity, work with counselor; consider standing desk or treadmill desk at work

## 2015-01-13 ENCOUNTER — Encounter: Payer: Self-pay | Admitting: Family Medicine

## 2015-01-13 LAB — COMPREHENSIVE METABOLIC PANEL
ALBUMIN: 4 g/dL (ref 3.5–5.5)
ALT: 17 IU/L (ref 0–32)
AST: 15 IU/L (ref 0–40)
Albumin/Globulin Ratio: 1.7 (ref 1.1–2.5)
Alkaline Phosphatase: 83 IU/L (ref 39–117)
BILIRUBIN TOTAL: 0.3 mg/dL (ref 0.0–1.2)
BUN / CREAT RATIO: 18 (ref 9–23)
BUN: 14 mg/dL (ref 6–24)
CO2: 21 mmol/L (ref 18–29)
CREATININE: 0.78 mg/dL (ref 0.57–1.00)
Calcium: 9.3 mg/dL (ref 8.7–10.2)
Chloride: 102 mmol/L (ref 97–108)
GFR, EST AFRICAN AMERICAN: 108 mL/min/{1.73_m2} (ref >59)
GFR, EST NON AFRICAN AMERICAN: 94 mL/min/{1.73_m2} (ref >59)
GLUCOSE: 101 mg/dL — AB (ref 65–99)
Globulin, Total: 2.3 g/dL (ref 1.5–4.5)
Potassium: 4.3 mmol/L (ref 3.5–5.2)
Sodium: 142 mmol/L (ref 134–144)
TOTAL PROTEIN: 6.3 g/dL (ref 6.0–8.5)

## 2015-01-13 LAB — CBC WITH DIFFERENTIAL/PLATELET
BASOS: 1 %
Basophils Absolute: 0 10*3/uL (ref 0.0–0.2)
EOS (ABSOLUTE): 0.1 10*3/uL (ref 0.0–0.4)
Eos: 2 %
Hematocrit: 39.4 % (ref 34.0–46.6)
Hemoglobin: 13 g/dL (ref 11.1–15.9)
IMMATURE GRANULOCYTES: 0 %
Immature Grans (Abs): 0 10*3/uL (ref 0.0–0.1)
Lymphocytes Absolute: 1.5 10*3/uL (ref 0.7–3.1)
Lymphs: 27 %
MCH: 27.1 pg (ref 26.6–33.0)
MCHC: 33 g/dL (ref 31.5–35.7)
MCV: 82 fL (ref 79–97)
MONOS ABS: 0.4 10*3/uL (ref 0.1–0.9)
Monocytes: 7 %
NEUTROS PCT: 63 %
Neutrophils Absolute: 3.6 10*3/uL (ref 1.4–7.0)
PLATELETS: 233 10*3/uL (ref 150–379)
RBC: 4.8 x10E6/uL (ref 3.77–5.28)
RDW: 13.9 % (ref 12.3–15.4)
WBC: 5.6 10*3/uL (ref 3.4–10.8)

## 2015-01-13 LAB — LIPID PANEL W/O CHOL/HDL RATIO
Cholesterol, Total: 130 mg/dL (ref 100–199)
HDL: 48 mg/dL (ref >39)
LDL Calculated: 65 mg/dL (ref 0–99)
Triglycerides: 86 mg/dL (ref 0–149)
VLDL Cholesterol Cal: 17 mg/dL (ref 5–40)

## 2015-01-13 LAB — HIV ANTIBODY (ROUTINE TESTING W REFLEX): HIV Screen 4th Generation wRfx: NONREACTIVE

## 2015-01-13 LAB — TSH: TSH: 4.59 u[IU]/mL — ABNORMAL HIGH (ref 0.450–4.500)

## 2015-01-16 LAB — HEPATITIS B SURFACE ANTIGEN: HEP B S AG: NEGATIVE

## 2015-01-16 LAB — HEPATITIS C ANTIBODY

## 2015-01-16 LAB — SPECIMEN STATUS REPORT

## 2015-02-15 ENCOUNTER — Encounter: Payer: Self-pay | Admitting: Family Medicine

## 2015-02-15 ENCOUNTER — Ambulatory Visit (INDEPENDENT_AMBULATORY_CARE_PROVIDER_SITE_OTHER): Payer: BLUE CROSS/BLUE SHIELD | Admitting: Family Medicine

## 2015-02-15 VITALS — BP 112/69 | HR 77 | Temp 98.0°F | Wt 324.0 lb

## 2015-02-15 DIAGNOSIS — F341 Dysthymic disorder: Secondary | ICD-10-CM | POA: Diagnosis not present

## 2015-02-15 DIAGNOSIS — R946 Abnormal results of thyroid function studies: Secondary | ICD-10-CM | POA: Diagnosis not present

## 2015-02-15 DIAGNOSIS — R21 Rash and other nonspecific skin eruption: Secondary | ICD-10-CM | POA: Diagnosis not present

## 2015-02-15 DIAGNOSIS — F411 Generalized anxiety disorder: Secondary | ICD-10-CM | POA: Diagnosis not present

## 2015-02-15 MED ORDER — LEVOTHYROXINE SODIUM 25 MCG PO TABS: 25.0000 ug | ORAL_TABLET | Freq: Every day | ORAL | Status: DC

## 2015-02-15 NOTE — Assessment & Plan Note (Signed)
Patient reports her test result has been abnormal for about 3 years; she has gained significant weight in that time, has low energy, depressed mood; I think it is reasonable to start very low dose thyroid replacement; discussed s/s of over-replacement, reasons to call me before we get labs in 8 weeks; she agrees with plan

## 2015-02-15 NOTE — Assessment & Plan Note (Signed)
Patient declined offer for examination; per her description, it sounds fungal; continue daily application of antifungal, may take up to one month for complete healing

## 2015-02-15 NOTE — Progress Notes (Signed)
BP 112/69 mmHg  Pulse 77  Temp(Src) 98 F (36.7 C)  Wt 324 lb (146.965 kg)  SpO2 97%   Subjective:    Patient ID: Jamie Hardin, female    DOB: [birth year], 44 y.o.   MRN: [id number]  HPI: Jamie Hardin is a 44 y.o. female  Chief Complaint  Patient presents with  . Anxiety    She was started on Lexapro by another doctor and it did not do well. Then she started on Brintellix. It is making her very dizzy.   She took one pill and felt like her skin was crawling She started Trintellix prescribed by the psychiatrist; feeling dizzy, has had four doses;   GAD 7 : Generalized Anxiety Score 02/15/2015  Nervous, Anxious, on Edge 3  Control/stop worrying 3  Worry too much - different things 3  Trouble relaxing 3  Restless 0  Easily annoyed or irritable 3  Afraid - awful might happen 2  Total GAD 7 Score 17  Anxiety Difficulty Very difficult   Her sleep has been okay the last few nights; lots of coughing, sinuses have been draining so not sleeping as well; not sleep deprivation though Not exercising  She has a rash; she looked it up, used to get yeast infections a lot; started using lotrimin; in the crease; just four days of treatment, getting better already; not beefy red, no satellite lesions per her description  Has had slightly abnormal thyroid tests for three years; very low energy; TSH was 4.590  Relevant past medical, surgical, family and social history reviewed and updated as indicated. Interim medical history since our last visit reviewed. Allergies and medications reviewed and updated.  Review of Systems Per HPI unless specifically indicated above     Objective:    BP 112/69 mmHg  Pulse 77  Temp(Src) 98 F (36.7 C)  Wt 324 lb (146.965 kg)  SpO2 97%  Wt Readings from Last 3 Encounters:  02/15/15 324 lb (146.965 kg)  01/12/15 327 lb (148.326 kg)  12/29/14 320 lb (145.151 kg)  body mass index is 54.78 kg/(m^2).  Physical Exam  Constitutional: She appears  well-developed and well-nourished. No distress.  Morbidly obese  Cardiovascular: Normal rate and regular rhythm.   Pulmonary/Chest: Effort normal and breath sounds normal.  Skin:  Offered exam of rash, patient declined  Psychiatric: She has a normal mood and affect. Her speech is normal and behavior is normal. Judgment and thought content normal. Cognition and memory are normal.   Results for orders placed or performed in visit on 01/12/15  CBC with Differential/Platelet  Result Value Ref Range   WBC 5.6 3.4 - 10.8 x10E3/uL   RBC 4.80 3.77 - 5.28 x10E6/uL   Hemoglobin 13.0 11.1 - 15.9 g/dL   Hematocrit 39.4 34.0 - 46.6 %   MCV 82 79 - 97 fL   MCH 27.1 26.6 - 33.0 pg   MCHC 33.0 31.5 - 35.7 g/dL   RDW 13.9 12.3 - 15.4 %   Platelets 233 150 - 379 x10E3/uL   Neutrophils 63 %   Lymphs 27 %   Monocytes 7 %   Eos 2 %   Basos 1 %   Neutrophils Absolute 3.6 1.4 - 7.0 x10E3/uL   Lymphocytes Absolute 1.5 0.7 - 3.1 x10E3/uL   Monocytes Absolute 0.4 0.1 - 0.9 x10E3/uL   EOS (ABSOLUTE) 0.1 0.0 - 0.4 x10E3/uL   Basophils Absolute 0.0 0.0 - 0.2 x10E3/uL   Immature Granulocytes 0 %   Immature  Grans (Abs) 0.0 0.0 - 0.1 x10E3/uL  Lipid Panel w/o Chol/HDL Ratio  Result Value Ref Range   Cholesterol, Total 130 100 - 199 mg/dL   Triglycerides 86 0 - 149 mg/dL   HDL 48 >39 mg/dL   VLDL Cholesterol Cal 17 5 - 40 mg/dL   LDL Calculated 65 0 - 99 mg/dL  TSH  Result Value Ref Range   TSH 4.590 (H) 0.450 - 4.500 uIU/mL  Comprehensive metabolic panel  Result Value Ref Range   Glucose 101 (H) 65 - 99 mg/dL   BUN 14 6 - 24 mg/dL   Creatinine, Ser 0.78 0.57 - 1.00 mg/dL   GFR calc non Af Amer 94 >59 mL/min/1.73   GFR calc Af Amer 108 >59 mL/min/1.73   BUN/Creatinine Ratio 18 9 - 23   Sodium 142 134 - 144 mmol/L   Potassium 4.3 3.5 - 5.2 mmol/L   Chloride 102 97 - 108 mmol/L   CO2 21 18 - 29 mmol/L   Calcium 9.3 8.7 - 10.2 mg/dL   Total Protein 6.3 6.0 - 8.5 g/dL   Albumin 4.0 3.5 - 5.5 g/dL    Globulin, Total 2.3 1.5 - 4.5 g/dL   Albumin/Globulin Ratio 1.7 1.1 - 2.5   Bilirubin Total 0.3 0.0 - 1.2 mg/dL   Alkaline Phosphatase 83 39 - 117 IU/L   AST 15 0 - 40 IU/L   ALT 17 0 - 32 IU/L  HIV antibody  Result Value Ref Range   HIV Screen 4th Generation wRfx Non Reactive Non Reactive  Specimen status report  Result Value Ref Range   specimen status report Comment   Hepatitis C antibody  Result Value Ref Range   Hep C Virus Ab <0.1 0.0 - 0.9 s/co ratio  Hepatitis B surface antigen  Result Value Ref Range   Hepatitis B Surface Ag Negative Negative      Assessment & Plan:   Problem List Items Addressed This Visit      Musculoskeletal and Integument   Rash    Patient declined offer for examination; per her description, it sounds fungal; continue daily application of antifungal, may take up to one month for complete healing        Other   Morbid obesity ([place])    Encouraged weight loss, increase activity level, discussed finding an activity that she considers fun, enjoyable and doing that; see AVS; encouraged her to check out What It Takes to Lose Weight      Dysthymic disorder    Seeing psychiatrist      Relevant Medications   amitriptyline (ELAVIL) 10 MG tablet   Vortioxetine HBr (BRINTELLIX PO)   GAD (generalized anxiety disorder) - Primary    Glad she is seeing psychiatrist; encoruaged her to call her psychiatrist and let her know about her symptoms; not sure if those will abate with continued use or if med change indicated      Abnormal thyroid function test    Patient reports her test result has been abnormal for about 3 years; she has gained significant weight in that time, has low energy, depressed mood; I think it is reasonable to start very low dose thyroid replacement; discussed s/s of over-replacement, reasons to call me before we get labs in 8 weeks; she agrees with plan         Follow up plan: Return in about 8 weeks (around 04/12/2015) for labs  (nonfasting).  Meds ordered this encounter  Medications  . amitriptyline (ELAVIL) 10  MG tablet    Sig: Take 10 mg by mouth.    Refill:  0  . Vortioxetine HBr (BRINTELLIX PO)    Sig: Take by mouth daily.  Marland Kitchen levothyroxine (SYNTHROID) 25 MCG tablet    Sig: Take 1 tablet (25 mcg total) by mouth daily before breakfast.    Dispense:  30 tablet    Refill:  1   Elavil and trintellix are from psychiatrist

## 2015-02-15 NOTE — Assessment & Plan Note (Addendum)
Encouraged weight loss, increase activity level, discussed finding an activity that she considers fun, enjoyable and doing that; see AVS; encouraged her to check out What It Takes to Lose Weight

## 2015-02-15 NOTE — Assessment & Plan Note (Signed)
Glad she is seeing psychiatrist; encoruaged her to call her psychiatrist and let her know about her symptoms; not sure if those will abate with continued use or if med change indicated

## 2015-02-15 NOTE — Assessment & Plan Note (Signed)
Seeing psychiatrist 

## 2015-02-15 NOTE — Patient Instructions (Addendum)
Start new thyroid medicine Have labs checked in 8 weeks Do please call your psychiatrist about your symptoms on the new medicine Check out the information at familydoctor.org entitled "What It Takes to Lose Weight" Try to lose between 1-2 pounds per week by taking in fewer calories and burning off more calories You can succeed by limiting portions, limiting foods dense in calories and fat, becoming more active, and drinking 8 glasses of water a day (64 ounces) Don't skip meals, especially breakfast, as skipping meals may alter your metabolism Do not use over-the-counter weight loss pills or gimmicks that claim rapid weight loss A healthy BMI (or body mass index) is between 18.5 and 24.9 You can calculate your ideal BMI at the [place] website [employer]fr Okay to take magnesium and potassium-rich foods Return on or after April 11th for routine follow-up, fasting labs

## 2015-02-17 ENCOUNTER — Telehealth: Payer: Self-pay | Admitting: Family Medicine

## 2015-02-17 NOTE — Telephone Encounter (Signed)
-----   Message from Arnetha Courser, MD sent at 01/29/2015 12:31 PM EDT ----- Regarding: Breast imaging incomplete; they are going to look at old films and give addendum Make sure they followed up on old film comparison

## 2015-02-17 NOTE — Telephone Encounter (Signed)
I have not seen results yet of the mammogram final report Please follow-up on this

## 2015-02-19 NOTE — Telephone Encounter (Signed)
Attempted to contact [facility] to obtain report and got no answer; after holding for 3 minutes, the call was disconnected.

## 2015-03-01 NOTE — Telephone Encounter (Signed)
Report requested, they will fax it over.

## 2015-03-01 NOTE — Telephone Encounter (Signed)
Results are in your blue basket.

## 2015-03-02 NOTE — Telephone Encounter (Signed)
Noted, thank you; note on copy of paper result to CMA to put in reminder for 6 month f/u films

## 2015-04-12 ENCOUNTER — Ambulatory Visit: Payer: BLUE CROSS/BLUE SHIELD | Admitting: Family Medicine

## 2015-04-27 ENCOUNTER — Ambulatory Visit (INDEPENDENT_AMBULATORY_CARE_PROVIDER_SITE_OTHER): Payer: BLUE CROSS/BLUE SHIELD | Admitting: Family Medicine

## 2015-04-27 ENCOUNTER — Encounter: Payer: Self-pay | Admitting: Family Medicine

## 2015-04-27 VITALS — BP 121/83 | HR 85 | Temp 98.5°F | Wt 330.0 lb

## 2015-04-27 DIAGNOSIS — G501 Atypical facial pain: Secondary | ICD-10-CM

## 2015-04-27 DIAGNOSIS — D492 Neoplasm of unspecified behavior of bone, soft tissue, and skin: Secondary | ICD-10-CM | POA: Insufficient documentation

## 2015-04-27 DIAGNOSIS — Z114 Encounter for screening for human immunodeficiency virus [HIV]: Secondary | ICD-10-CM

## 2015-04-27 DIAGNOSIS — Z1159 Encounter for screening for other viral diseases: Secondary | ICD-10-CM | POA: Diagnosis not present

## 2015-04-27 DIAGNOSIS — H6983 Other specified disorders of Eustachian tube, bilateral: Secondary | ICD-10-CM | POA: Diagnosis not present

## 2015-04-27 DIAGNOSIS — H6980 Other specified disorders of Eustachian tube, unspecified ear: Secondary | ICD-10-CM | POA: Insufficient documentation

## 2015-04-27 DIAGNOSIS — R946 Abnormal results of thyroid function studies: Secondary | ICD-10-CM

## 2015-04-27 DIAGNOSIS — F411 Generalized anxiety disorder: Secondary | ICD-10-CM | POA: Diagnosis not present

## 2015-04-27 MED ORDER — PROAIR HFA 108 (90 BASE) MCG/ACT IN AERS
2.0000 | INHALATION_SPRAY | RESPIRATORY_TRACT | Status: DC | PRN
Start: 2015-04-27 — End: 2016-03-21

## 2015-04-27 MED ORDER — VALACYCLOVIR HCL 1 G PO TABS: ORAL_TABLET | ORAL | Status: DC

## 2015-04-27 NOTE — Assessment & Plan Note (Signed)
Already done, negative

## 2015-04-27 NOTE — Progress Notes (Signed)
BP 121/83 mmHg  Pulse 85  Temp(Src) 98.5 F (36.9 C)  Wt 330 lb (149.687 kg)  SpO2 98%   Subjective:    Patient ID: Jamie Hardin, female    DOB: 1971-01-29, 45 y.o.   MRN: [id number]  HPI: Jamie Hardin is a 45 y.o. female  Chief Complaint  Patient presents with  . Morbid Obesity    she has gained 6 pounds since her last visit  . Abnormal TSH    recheck, she could not tolerate the Levothyroxine.  . Lab Work    her HIV test was not done at last appointment, needs to be collected.  . Jaw Pain    jaw/face pain on right side x 8 days. Sores in her mouth. Thought she was maybe getting cold sores so was taking the Acyclovir   . Medication Problem    Her psych doctor rx'ed the Trintellix and increased it, wanted to get your ok on it.   She cannot tolerate the levothyroxine; her blood pressure was pounding, could feel it in her neck; body felt hot; shook; did not have loose stools; no extra hair loss; she stopped her thyroid medicine 3 weeks ago Last TSH was 4.590 on January 12, 2015 No one in the family with thyroid disease Was under fair amount of stress  She has been doing well on the Trintellix, still having anxiety; lots of problems with teenager, patient is going full-time with work and school; she does not like amitriptyline; the psychiatrist wanted my okay apparently to increase her Trintellix  She has been having pain in the right ear, felt like someone stabbed her in the ear with an ice pick; she has sores inside of the mouth; gets nasty cold sores usually inside the mouth; rarely gets them; takes acyclovir and that usually clears it up; swishing with peroxide; she has had this round for 8 days; no fevers; no loss of hearing, but feels muffled; swollen glands under the neck; little swollen but no facial weakness; having dental pain, gums inflamed, not draining goo from sinuses; using neti pot and then a whole bunch of yellow stuff came out of her right side, not usually an  issue but she has actually been sick lately; she was on three rounds of prednisone and two round of antibiotics over Christmas for sinus problems and walking pneumonia; the cough is gone  Relevant past medical, surgical, family and social history reviewed and updated as indicated. Interim medical history since our last visit reviewed. Allergies and medications reviewed and updated.  Review of Systems Per HPI unless specifically indicated above     Objective:    BP 121/83 mmHg  Pulse 85  Temp(Src) 98.5 F (36.9 C)  Wt 330 lb (149.687 kg)  SpO2 98%  Wt Readings from Last 3 Encounters:  04/27/15 330 lb (149.687 kg)  02/15/15 324 lb (146.965 kg)  01/12/15 327 lb (148.326 kg)    Physical Exam  Constitutional: She appears well-developed and well-nourished. No distress.  Morbidly obese  HENT:  Head: Normocephalic and atraumatic.  Right Ear: External ear and ear canal normal. No tenderness. No mastoid tenderness. Tympanic membrane is not injected, not perforated and not erythematous. A middle ear effusion is present.  Left Ear: External ear and ear canal normal. No tenderness. No mastoid tenderness. Tympanic membrane is not injected, not perforated and not erythematous. A middle ear effusion is present.  Nose: Rhinorrhea present. No epistaxis. Right sinus exhibits no maxillary sinus tenderness and no  frontal sinus tenderness. Left sinus exhibits no maxillary sinus tenderness and no frontal sinus tenderness.  Mouth/Throat: Oropharynx is clear and moist and mucous membranes are normal.  Shallow ulceration along buccal/gingival mucosa upper 2-4 teeth; no other oral lesions or blisters  Eyes: EOM are normal. No scleral icterus.  Neck: No thyromegaly present.  Cardiovascular: Normal rate, regular rhythm and normal heart sounds.   No murmur heard. Pulmonary/Chest: Effort normal and breath sounds normal. No respiratory distress. She has no wheezes.  Abdominal: Soft. Bowel sounds are normal. She  exhibits no distension.  Musculoskeletal: Normal range of motion. She exhibits no edema.  Lymphadenopathy:       Head (right side): No preauricular and no posterior auricular adenopathy present.       Head (left side): No preauricular and no posterior auricular adenopathy present.    She has no cervical adenopathy.  Neurological: She is alert. She exhibits normal muscle tone.  Skin: Skin is warm and dry. Lesion (irregular dark brown nevus, macular, one closest to neck has tiny papular area; 4x7 mm bilobed dark brown on right side of neck) noted. She is not diaphoretic. No pallor.  Psychiatric: She has a normal mood and affect. Her behavior is normal. Judgment and thought content normal.   Results for orders placed or performed in visit on 01/12/15  CBC with Differential/Platelet  Result Value Ref Range   WBC 5.6 3.4 - 10.8 x10E3/uL   RBC 4.80 3.77 - 5.28 x10E6/uL   Hemoglobin 13.0 11.1 - 15.9 g/dL   Hematocrit 39.4 34.0 - 46.6 %   MCV 82 79 - 97 fL   MCH 27.1 26.6 - 33.0 pg   MCHC 33.0 31.5 - 35.7 g/dL   RDW 13.9 12.3 - 15.4 %   Platelets 233 150 - 379 x10E3/uL   Neutrophils 63 %   Lymphs 27 %   Monocytes 7 %   Eos 2 %   Basos 1 %   Neutrophils Absolute 3.6 1.4 - 7.0 x10E3/uL   Lymphocytes Absolute 1.5 0.7 - 3.1 x10E3/uL   Monocytes Absolute 0.4 0.1 - 0.9 x10E3/uL   EOS (ABSOLUTE) 0.1 0.0 - 0.4 x10E3/uL   Basophils Absolute 0.0 0.0 - 0.2 x10E3/uL   Immature Granulocytes 0 %   Immature Grans (Abs) 0.0 0.0 - 0.1 x10E3/uL  Lipid Panel w/o Chol/HDL Ratio  Result Value Ref Range   Cholesterol, Total 130 100 - 199 mg/dL   Triglycerides 86 0 - 149 mg/dL   HDL 48 >39 mg/dL   VLDL Cholesterol Cal 17 5 - 40 mg/dL   LDL Calculated 65 0 - 99 mg/dL  TSH  Result Value Ref Range   TSH 4.590 (H) 0.450 - 4.500 uIU/mL  Comprehensive metabolic panel  Result Value Ref Range   Glucose 101 (H) 65 - 99 mg/dL   BUN 14 6 - 24 mg/dL   Creatinine, Ser 0.78 0.57 - 1.00 mg/dL   GFR calc non Af  Amer 94 >59 mL/min/1.73   GFR calc Af Amer 108 >59 mL/min/1.73   BUN/Creatinine Ratio 18 9 - 23   Sodium 142 134 - 144 mmol/L   Potassium 4.3 3.5 - 5.2 mmol/L   Chloride 102 97 - 108 mmol/L   CO2 21 18 - 29 mmol/L   Calcium 9.3 8.7 - 10.2 mg/dL   Total Protein 6.3 6.0 - 8.5 g/dL   Albumin 4.0 3.5 - 5.5 g/dL   Globulin, Total 2.3 1.5 - 4.5 g/dL   Albumin/Globulin Ratio 1.7 1.1 -  2.5   Bilirubin Total 0.3 0.0 - 1.2 mg/dL   Alkaline Phosphatase 83 39 - 117 IU/L   AST 15 0 - 40 IU/L   ALT 17 0 - 32 IU/L  HIV antibody  Result Value Ref Range   HIV Screen 4th Generation wRfx Non Reactive Non Reactive  Specimen status report  Result Value Ref Range   specimen status report Comment   Hepatitis C antibody  Result Value Ref Range   Hep C Virus Ab <0.1 0.0 - 0.9 s/co ratio  Hepatitis B surface antigen  Result Value Ref Range   Hepatitis B Surface Ag Negative Negative      Assessment & Plan:   Problem List Items Addressed This Visit      Nervous and Auditory   Eustachian tube dysfunction    Start back on nasal corticosteroid        Musculoskeletal and Integument   Neoplasm of skin of neck    Concerning nevus on the right side of the neck; I put in a high priority dermatology referral for excisional biopsy; malignant melanoma in the ddx      Relevant Orders   Ambulatory referral to Dermatology     Other   GAD (generalized anxiety disorder)    Seeing psychiatrist; I think it will be fine to increase the Trintellix as psychiatrist sees fit; I do not know of any contraindication to doing so      Abnormal thyroid function test    Recheck today;she did not tolerate thyroid medicine, even at low dose      Relevant Orders   T4, free   TSH   Screening for HIV (human immunodeficiency virus)    Draw today per staff; however, later in the evening it was discovered that this was collected last time and was negative      Relevant Orders   HIV antibody   Need for hepatitis C  screening test    Already done, negative      Need for hepatitis B screening test    Already done, negative      Facial pain, atypical - Primary    ddx discussed with patient; 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; she had yellow material washed out with neti pot, but no fever and no tenderness over maxillary or frontal sinuses on palpation; no blisters externally to suggest herpes zoster; could be herpes simplex eruption (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  Meds ordered this encounter  Medications  . Multiple Vitamin (MULTI-VITAMINS) TABS    Sig: Take by mouth daily.  . Vortioxetine HBr (TRINTELLIX) 10 MG TABS    Sig: Take 20 mg by mouth daily.  Marland Kitchen PROAIR HFA 108 (90 Base) MCG/ACT inhaler    Sig: Inhale 2 puffs into the lungs every 4 (four) hours as needed for wheezing or shortness of breath.    Dispense:  8.5 g    Refill:  0  . valACYclovir (VALTREX) 1000 MG tablet    Sig: Two pills by  mouth at first sign of outbreak followed by two more pills twelve hours later    Dispense:  4 tablet    Refill:  2    Stop acyclovir   An after-visit summary was printed and given to the patient at [place].  Please see the patient instructions which may contain other information and recommendations beyond what is mentioned above in the assessment and plan.  Orders Placed This Encounter  Procedures  . T4, free  . TSH  . HIV antibody  . CBC with Differential/Platelet  . Ambulatory referral to Dermatology    Referral Priority:  High    Referral Type:  Consultation    Referral Reason:   Specialty Services Required    Requested Specialty:  Dermatology    Number of Visits Requested:  1

## 2015-04-27 NOTE — Assessment & Plan Note (Signed)
Concerning nevus on the right side of the neck; I put in a high priority dermatology referral for excisional biopsy; malignant melanoma in the ddx

## 2015-04-27 NOTE — Patient Instructions (Addendum)
Okay with me to increase the dose as recommended by psychiatrist of the Trintellix We'll get labs today and contact you with the results We'll refer you to see the dermatologist about the mole on your neck Start back on the nasal spray Please do eat yogurt daily or take a probiotic daily for the next month or two We want to replace the healthy germs in the gut If you notice foul, watery diarrhea in the next two months, schedule an appointment RIGHT AWAY Let me know of any changes in your symptoms Stop the acyclovir Start valacyclovir (two pills now followed by two more pills twelve hours later and then you're done)

## 2015-04-27 NOTE — Assessment & Plan Note (Signed)
Start back on nasal corticosteroid

## 2015-04-27 NOTE — Assessment & Plan Note (Addendum)
Recheck today;she did not tolerate thyroid medicine, even at low dose

## 2015-04-27 NOTE — Assessment & Plan Note (Signed)
Seeing psychiatrist; I think it will be fine to increase the Trintellix as psychiatrist sees fit; I do not know of any contraindication to doing so

## 2015-04-27 NOTE — Assessment & Plan Note (Signed)
Draw today per staff; however, later in the evening it was discovered that this was collected last time and was negative

## 2015-04-27 NOTE — Assessment & Plan Note (Signed)
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

## 2015-04-28 LAB — CBC WITH DIFFERENTIAL/PLATELET
BASOS ABS: 0 10*3/uL (ref 0.0–0.2)
Basos: 0 %
EOS (ABSOLUTE): 0.1 10*3/uL (ref 0.0–0.4)
Eos: 2 %
Hematocrit: 39.3 % (ref 34.0–46.6)
Hemoglobin: 13.4 g/dL (ref 11.1–15.9)
Immature Grans (Abs): 0 10*3/uL (ref 0.0–0.1)
Immature Granulocytes: 0 %
LYMPHS ABS: 1.5 10*3/uL (ref 0.7–3.1)
Lymphs: 25 %
MCH: 28.1 pg (ref 26.6–33.0)
MCHC: 34.1 g/dL (ref 31.5–35.7)
MCV: 82 fL (ref 79–97)
Monocytes Absolute: 0.4 10*3/uL (ref 0.1–0.9)
Monocytes: 6 %
NEUTROS ABS: 3.9 10*3/uL (ref 1.4–7.0)
Neutrophils: 67 %
PLATELETS: 228 10*3/uL (ref 150–379)
RBC: 4.77 x10E6/uL (ref 3.77–5.28)
RDW: 14.2 % (ref 12.3–15.4)
WBC: 6 10*3/uL (ref 3.4–10.8)

## 2015-04-28 LAB — T4, FREE: Free T4: 1.06 ng/dL (ref 0.82–1.77)

## 2015-04-28 LAB — HEPATITIS C ANTIBODY

## 2015-04-28 LAB — HIV ANTIBODY (ROUTINE TESTING W REFLEX): HIV SCREEN 4TH GENERATION: NONREACTIVE

## 2015-04-28 LAB — TSH: TSH: 2.78 u[IU]/mL (ref 0.450–4.500)

## 2015-04-28 LAB — HEPATITIS B SURFACE ANTIGEN: HEP B S AG: NEGATIVE

## 2015-05-28 ENCOUNTER — Ambulatory Visit (INDEPENDENT_AMBULATORY_CARE_PROVIDER_SITE_OTHER): Payer: BLUE CROSS/BLUE SHIELD | Admitting: Family Medicine

## 2015-05-28 ENCOUNTER — Encounter: Payer: Self-pay | Admitting: Family Medicine

## 2015-05-28 VITALS — BP 125/68 | HR 90 | Temp 99.2°F | Ht 66.5 in | Wt 333.2 lb

## 2015-05-28 DIAGNOSIS — D492 Neoplasm of unspecified behavior of bone, soft tissue, and skin: Secondary | ICD-10-CM

## 2015-05-28 DIAGNOSIS — G501 Atypical facial pain: Secondary | ICD-10-CM | POA: Diagnosis not present

## 2015-05-28 MED ORDER — PREDNISONE 10 MG PO TABS: ORAL_TABLET | ORAL | Status: AC

## 2015-05-28 MED ORDER — LIRAGLUTIDE -WEIGHT MANAGEMENT 18 MG/3ML ~~LOC~~ SOPN: 0.6000 mg | PEN_INJECTOR | Freq: Every day | SUBCUTANEOUS | Status: DC

## 2015-05-28 NOTE — Assessment & Plan Note (Signed)
May be ETD, with serous effusion behind both ears; she does not want to try nasal afrin; she'll get back on nasal corticosteroid; trial of five day prednisone; to ENT if not getting better

## 2015-05-28 NOTE — Assessment & Plan Note (Signed)
She reports seeing derm and was told the lesion on her neck was nothing to worry about

## 2015-05-28 NOTE — Patient Instructions (Addendum)
Start back on the nasonex Start the prednisone See ENT if not getting better We'll start the prior auth process for Saxenda and your pharmacist can teach you how to do the injection Return for medication follow-up  Check out the information at [facility]doctor.org entitled "What It Takes to Lose Weight" Try to lose between 1-2 pounds per week by taking in fewer calories and burning off more calories You can succeed by limiting portions, limiting foods dense in calories and fat, becoming more active, and drinking 8 glasses of water a day (64 ounces) Don't skip meals, especially breakfast, as skipping meals may alter your metabolism Do not use over-the-counter weight loss pills or gimmicks that claim rapid weight loss A healthy BMI (or body mass index) is between 18.5 and 24.9 You can calculate your ideal BMI at the [place] website [email or web address]

## 2015-05-28 NOTE — Progress Notes (Signed)
BP 125/68 mmHg  Pulse 90  Temp(Src) 99.2 F (37.3 C)  Ht 5' 6.5" (1.689 m)  Wt 333 lb 3.2 oz (151.139 kg)  BMI 52.98 kg/m2  SpO2 97%   Subjective:    Patient ID: Jamie Hardin, female    DOB: [birth year], 45 y.o.   MRN: [id number]  HPI: Jamie Hardin is a 45 y.o. female  Chief Complaint  Patient presents with  . Follow-up  . Facial Pain    Patient is still have problems   . Neck Pain  . Ear Pain   The sores went away; her pain is 80% better; she thinks she has discomfort inside sinus or tooth; she saw her dentist a few months ago; right side tooth and lower jaw but not in the cheek; the sores are gone; she thinks it is her inner ear, sinuses; using CPAP and feels pressure inside the ear; she got addicted to nasal spray and is reticent to use Afrin; she is open to using prednisone  She saw dermatologist and they laughed at her; asked if there was anything else she wanted checked; the moles on the neck were just moles, did not need to be removed; checked her back  Today is her first appt with work nutritionist; she is very interested in losing weight; no hx of thyroid cancer; no fam hx of MEN2  Relevant past medica history reviewed Interim medical history since our last visit reviewed. Allergies and medications reviewed and updated.  Review of Systems Per HPI unless specifically indicated above     Objective:    BP 125/68 mmHg  Pulse 90  Temp(Src) 99.2 F (37.3 C)  Ht 5' 6.5" (1.689 m)  Wt 333 lb 3.2 oz (151.139 kg)  BMI 52.98 kg/m2  SpO2 97%  Wt Readings from Last 3 Encounters:  05/28/15 333 lb 3.2 oz (151.139 kg)  04/27/15 330 lb (149.687 kg)  02/15/15 324 lb (146.965 kg)    Physical Exam  Constitutional: She appears well-developed and well-nourished.  Morbid obesity  HENT:  Right Ear: Tympanic membrane is not erythematous. A middle ear effusion is present.  Left Ear: Tympanic membrane is not erythematous. A middle ear effusion is present.  Nose: No  mucosal edema or rhinorrhea.  Mouth/Throat: Mucous membranes are normal. Mucous membranes are not pale and not dry. No oropharyngeal exudate, posterior oropharyngeal edema or posterior oropharyngeal erythema.  Eyes: EOM are normal. No scleral icterus.  Cardiovascular: Normal rate.   Pulmonary/Chest: Effort normal and breath sounds normal.  Psychiatric: She has a normal mood and affect. Her behavior is normal.    Results for orders placed or performed in visit on 04/27/15  T4, free  Result Value Ref Range   Free T4 1.06 0.82 - 1.77 ng/dL  TSH  Result Value Ref Range   TSH 2.780 0.450 - 4.500 uIU/mL  HIV antibody  Result Value Ref Range   HIV Screen 4th Generation wRfx Non Reactive Non Reactive  CBC with Differential/Platelet  Result Value Ref Range   WBC 6.0 3.4 - 10.8 x10E3/uL   RBC 4.77 3.77 - 5.28 x10E6/uL   Hemoglobin 13.4 11.1 - 15.9 g/dL   Hematocrit 39.3 34.0 - 46.6 %   MCV 82 79 - 97 fL   MCH 28.1 26.6 - 33.0 pg   MCHC 34.1 31.5 - 35.7 g/dL   RDW 14.2 12.3 - 15.4 %   Platelets 228 150 - 379 x10E3/uL   Neutrophils 67 %   Lymphs 25 %  Monocytes 6 %   Eos 2 %   Basos 0 %   Neutrophils Absolute 3.9 1.4 - 7.0 x10E3/uL   Lymphocytes Absolute 1.5 0.7 - 3.1 x10E3/uL   Monocytes Absolute 0.4 0.1 - 0.9 x10E3/uL   EOS (ABSOLUTE) 0.1 0.0 - 0.4 x10E3/uL   Basophils Absolute 0.0 0.0 - 0.2 x10E3/uL   Immature Granulocytes 0 %   Immature Grans (Abs) 0.0 0.0 - 0.1 x10E3/uL  Hepatitis C antibody  Result Value Ref Range   Hep C Virus Ab <0.1 0.0 - 0.9 s/co ratio  Hepatitis B surface antigen  Result Value Ref Range   Hepatitis B Surface Ag Negative Negative      Assessment & Plan:   Problem List Items Addressed This Visit      Musculoskeletal and Integument   Neoplasm of skin of neck    She reports seeing derm and was told the lesion on her neck was nothing to worry about        Other   Morbid obesity ([place])    See AVS; discussed options for medication-assisted weight  loss; she opted for Saxenda and I have sent the Rx to the pharmacy to start prior auth process; warnings given regarding thyroid cancer, close f/u in 4 weeks; work with nutritionist at work      Relevant Medications   Liraglutide -Weight Management (SAXENDA) 18 MG/3ML SOPN   Facial pain, atypical - Primary    May be ETD, with serous effusion behind both ears; she does not want to try nasal afrin; she'll get back on nasal corticosteroid; trial of five day prednisone; to ENT if not getting better         Follow up plan: Return in about 4 weeks (around 06/25/2015) for medication follow-up.  Meds ordered this encounter  Medications  . clonazePAM (KLONOPIN) 0.5 MG tablet    Sig: Take 0.5 mg by mouth 2 (two) times daily as needed.    Refill:  0  . predniSONE (DELTASONE) 10 MG tablet    Sig: Five pills by mouth on day 1, then 4 on day 2, then 3 on day 3, then 2 on day 4, and 1 on day 5; take with food    Dispense:  15 tablet    Refill:  0  . Liraglutide -Weight Management (SAXENDA) 18 MG/3ML SOPN    Sig: Inject 0.6 mg into the skin daily. For one week, then 1.2 mg daily x 1 week, then 1.8 mg daily x 1 week, then 2.4 mg daily x 1 week, then 3 mg daily    Dispense:  3 mL    Refill:  0   An after-visit summary was printed and given to the patient at [place].  Please see the patient instructions which may contain other information and recommendations beyond what is mentioned above in the assessment and plan.

## 2015-05-28 NOTE — Assessment & Plan Note (Signed)
See AVS; discussed options for medication-assisted weight loss; she opted for Saxenda and I have sent the Rx to the pharmacy to start prior auth process; warnings given regarding thyroid cancer, close f/u in 4 weeks; work with nutritionist at work

## 2015-06-10 ENCOUNTER — Telehealth: Payer: Self-pay

## 2015-06-10 NOTE — Telephone Encounter (Signed)
It's in the chart; I approved it; feel free to send again

## 2015-06-10 NOTE — Telephone Encounter (Signed)
[facility] did not get the rx for Saxenda. Can you resend it?

## 2015-06-10 NOTE — Telephone Encounter (Signed)
Patient notified that rx was called in. 

## 2015-06-10 NOTE — Telephone Encounter (Signed)
RX called in .

## 2015-06-13 ENCOUNTER — Other Ambulatory Visit: Payer: Self-pay | Admitting: Family Medicine

## 2015-06-14 NOTE — Telephone Encounter (Signed)
rx approved

## 2015-06-25 ENCOUNTER — Ambulatory Visit: Payer: BLUE CROSS/BLUE SHIELD | Admitting: Family Medicine

## 2015-06-28 ENCOUNTER — Telehealth: Payer: Self-pay | Admitting: Family Medicine

## 2015-06-28 MED ORDER — LORCASERIN HCL 10 MG PO TABS: 1.0000 | ORAL_TABLET | Freq: Two times a day (BID) | ORAL | Status: DC

## 2015-06-28 NOTE — Telephone Encounter (Signed)
Removed saxenda I called her Patient should ask her pharmacist if okay to take Belviq with Trintellix Discussed risk of psych issues in particular If ANY dark thoughts, psych symptoms on the new med, stop Belviq right away and call me or seek help if immediate need See me 4 weeks after starting med for evaluation

## 2015-06-28 NOTE — Telephone Encounter (Signed)
Pts insurance wouldn't cover saxenda and she would like to try belviq sent to [facility]

## 2015-06-28 NOTE — Telephone Encounter (Signed)
Routing to provider  

## 2015-07-10 DIAGNOSIS — M9901 Segmental and somatic dysfunction of cervical region: Secondary | ICD-10-CM | POA: Diagnosis not present

## 2015-07-10 DIAGNOSIS — M9902 Segmental and somatic dysfunction of thoracic region: Secondary | ICD-10-CM | POA: Diagnosis not present

## 2015-07-10 DIAGNOSIS — M5414 Radiculopathy, thoracic region: Secondary | ICD-10-CM | POA: Diagnosis not present

## 2015-07-10 DIAGNOSIS — R51 Headache: Secondary | ICD-10-CM | POA: Diagnosis not present

## 2015-07-11 DIAGNOSIS — G4733 Obstructive sleep apnea (adult) (pediatric): Secondary | ICD-10-CM | POA: Diagnosis not present

## 2015-07-14 DIAGNOSIS — Z713 Dietary counseling and surveillance: Secondary | ICD-10-CM | POA: Diagnosis not present

## 2015-07-29 DIAGNOSIS — M9901 Segmental and somatic dysfunction of cervical region: Secondary | ICD-10-CM | POA: Diagnosis not present

## 2015-07-29 DIAGNOSIS — R51 Headache: Secondary | ICD-10-CM | POA: Diagnosis not present

## 2015-07-29 DIAGNOSIS — M9902 Segmental and somatic dysfunction of thoracic region: Secondary | ICD-10-CM | POA: Diagnosis not present

## 2015-07-29 DIAGNOSIS — M5414 Radiculopathy, thoracic region: Secondary | ICD-10-CM | POA: Diagnosis not present

## 2015-08-04 DIAGNOSIS — Z713 Dietary counseling and surveillance: Secondary | ICD-10-CM | POA: Diagnosis not present

## 2015-08-10 DIAGNOSIS — G4733 Obstructive sleep apnea (adult) (pediatric): Secondary | ICD-10-CM | POA: Diagnosis not present

## 2015-08-11 DIAGNOSIS — R921 Mammographic calcification found on diagnostic imaging of breast: Secondary | ICD-10-CM | POA: Diagnosis not present

## 2015-08-11 DIAGNOSIS — N63 Unspecified lump in breast: Secondary | ICD-10-CM | POA: Diagnosis not present

## 2015-08-11 DIAGNOSIS — Z713 Dietary counseling and surveillance: Secondary | ICD-10-CM | POA: Diagnosis not present

## 2015-08-11 DIAGNOSIS — R928 Other abnormal and inconclusive findings on diagnostic imaging of breast: Secondary | ICD-10-CM | POA: Diagnosis not present

## 2015-08-11 DIAGNOSIS — R922 Inconclusive mammogram: Secondary | ICD-10-CM | POA: Diagnosis not present

## 2015-08-18 DIAGNOSIS — Z713 Dietary counseling and surveillance: Secondary | ICD-10-CM | POA: Diagnosis not present

## 2015-08-27 ENCOUNTER — Encounter: Payer: Self-pay | Admitting: Family Medicine

## 2015-08-27 DIAGNOSIS — G4733 Obstructive sleep apnea (adult) (pediatric): Secondary | ICD-10-CM | POA: Diagnosis not present

## 2015-08-27 DIAGNOSIS — H93293 Other abnormal auditory perceptions, bilateral: Secondary | ICD-10-CM | POA: Diagnosis not present

## 2015-08-27 DIAGNOSIS — J301 Allergic rhinitis due to pollen: Secondary | ICD-10-CM | POA: Diagnosis not present

## 2015-08-27 DIAGNOSIS — H93299 Other abnormal auditory perceptions, unspecified ear: Secondary | ICD-10-CM | POA: Diagnosis not present

## 2015-08-30 ENCOUNTER — Other Ambulatory Visit: Payer: Self-pay | Admitting: Family Medicine

## 2015-09-03 DIAGNOSIS — J301 Allergic rhinitis due to pollen: Secondary | ICD-10-CM | POA: Diagnosis not present

## 2015-10-26 ENCOUNTER — Telehealth: Payer: Self-pay | Admitting: Family Medicine

## 2015-10-29 MED ORDER — OMEPRAZOLE 20 MG PO CPDR: 20.0000 mg | DELAYED_RELEASE_CAPSULE | Freq: Every day | ORAL | 0 refills | Status: DC | PRN

## 2015-10-29 MED ORDER — GABAPENTIN 300 MG PO CAPS: 300.0000 mg | ORAL_CAPSULE | Freq: Two times a day (BID) | ORAL | 1 refills | Status: DC

## 2015-10-29 NOTE — Telephone Encounter (Signed)
sent 

## 2015-11-15 DIAGNOSIS — G4733 Obstructive sleep apnea (adult) (pediatric): Secondary | ICD-10-CM | POA: Diagnosis not present

## 2015-11-15 DIAGNOSIS — J189 Pneumonia, unspecified organism: Secondary | ICD-10-CM | POA: Diagnosis not present

## 2015-11-23 DIAGNOSIS — R51 Headache: Secondary | ICD-10-CM | POA: Diagnosis not present

## 2015-11-23 DIAGNOSIS — J4541 Moderate persistent asthma with (acute) exacerbation: Secondary | ICD-10-CM | POA: Diagnosis not present

## 2015-11-23 DIAGNOSIS — G43119 Migraine with aura, intractable, without status migrainosus: Secondary | ICD-10-CM | POA: Diagnosis not present

## 2015-11-23 DIAGNOSIS — G629 Polyneuropathy, unspecified: Secondary | ICD-10-CM | POA: Diagnosis not present

## 2015-11-23 DIAGNOSIS — G4733 Obstructive sleep apnea (adult) (pediatric): Secondary | ICD-10-CM | POA: Diagnosis not present

## 2015-12-23 ENCOUNTER — Other Ambulatory Visit: Payer: Self-pay | Admitting: Family Medicine

## 2015-12-23 MED ORDER — GABAPENTIN 300 MG PO CAPS: 300.0000 mg | ORAL_CAPSULE | Freq: Two times a day (BID) | ORAL | 0 refills | Status: DC

## 2015-12-23 NOTE — Telephone Encounter (Signed)
Patient needs an appt please I sent in taper Rx for PPI and regular Rx for gabapentin Thank you

## 2015-12-23 NOTE — Telephone Encounter (Signed)
Lvm informing patient

## 2016-01-14 ENCOUNTER — Encounter: Payer: Self-pay | Admitting: Family Medicine

## 2016-01-14 ENCOUNTER — Ambulatory Visit (INDEPENDENT_AMBULATORY_CARE_PROVIDER_SITE_OTHER): Payer: BLUE CROSS/BLUE SHIELD | Admitting: Family Medicine

## 2016-01-14 VITALS — BP 124/64 | HR 77 | Temp 98.1°F | Resp 16 | Ht 65.0 in | Wt 332.1 lb

## 2016-01-14 DIAGNOSIS — Z0000 Encounter for general adult medical examination without abnormal findings: Secondary | ICD-10-CM

## 2016-01-14 DIAGNOSIS — Z8701 Personal history of pneumonia (recurrent): Secondary | ICD-10-CM | POA: Diagnosis not present

## 2016-01-14 DIAGNOSIS — Z9189 Other specified personal risk factors, not elsewhere classified: Secondary | ICD-10-CM | POA: Diagnosis not present

## 2016-01-14 DIAGNOSIS — Z23 Encounter for immunization: Secondary | ICD-10-CM | POA: Diagnosis not present

## 2016-01-14 LAB — COMPLETE METABOLIC PANEL WITH GFR
ALBUMIN: 4 g/dL (ref 3.6–5.1)
ALK PHOS: 68 U/L (ref 33–115)
ALT: 18 U/L (ref 6–29)
AST: 18 U/L (ref 10–30)
BUN: 14 mg/dL (ref 7–25)
CALCIUM: 9.1 mg/dL (ref 8.6–10.2)
CHLORIDE: 107 mmol/L (ref 98–110)
CO2: 18 mmol/L — ABNORMAL LOW (ref 20–31)
Creat: 0.78 mg/dL (ref 0.50–1.10)
Glucose, Bld: 102 mg/dL — ABNORMAL HIGH (ref 65–99)
POTASSIUM: 4.5 mmol/L (ref 3.5–5.3)
Sodium: 140 mmol/L (ref 135–146)
Total Bilirubin: 0.4 mg/dL (ref 0.2–1.2)
Total Protein: 6.4 g/dL (ref 6.1–8.1)

## 2016-01-14 LAB — CBC WITH DIFFERENTIAL/PLATELET
BASOS PCT: 0 %
Basophils Absolute: 0 cells/uL (ref 0–200)
EOS ABS: 100 {cells}/uL (ref 15–500)
Eosinophils Relative: 2 %
HEMATOCRIT: 40.8 % (ref 35.0–45.0)
Hemoglobin: 13.4 g/dL (ref 11.7–15.5)
LYMPHS PCT: 30 %
Lymphs Abs: 1500 cells/uL (ref 850–3900)
MCH: 27.4 pg (ref 27.0–33.0)
MCHC: 32.8 g/dL (ref 32.0–36.0)
MCV: 83.4 fL (ref 80.0–100.0)
MONO ABS: 350 {cells}/uL (ref 200–950)
MONOS PCT: 7 %
MPV: 10.1 fL (ref 7.5–12.5)
NEUTROS PCT: 61 %
Neutro Abs: 3050 cells/uL (ref 1500–7800)
PLATELETS: 234 10*3/uL (ref 140–400)
RBC: 4.89 MIL/uL (ref 3.80–5.10)
RDW: 13.5 % (ref 11.0–15.0)
WBC: 5 10*3/uL (ref 3.8–10.8)

## 2016-01-14 LAB — LIPID PANEL
CHOL/HDL RATIO: 3 ratio (ref <=5.0)
Cholesterol: 152 mg/dL (ref 125–200)
HDL: 51 mg/dL (ref >=46)
LDL Cholesterol: 81 mg/dL (ref <130)
Triglycerides: 101 mg/dL (ref <150)
VLDL: 20 mg/dL (ref <30)

## 2016-01-14 LAB — TSH: TSH: 2.45 mIU/L

## 2016-01-14 NOTE — Assessment & Plan Note (Signed)
PPSV-23 offered and given

## 2016-01-14 NOTE — Patient Instructions (Addendum)
Vitamin D 1,000 iu daily Vitamin B12 500 or 1000 mcg daily    12 Ways to Curb Anxiety  ?Anxiety is normal human sensation. It is what helped our ancestors survive the pitfalls of the wilderness. Anxiety is defined as experiencing worry or nervousness about an imminent event or something with an uncertain outcome. It is a feeling experienced by most people at some point in their lives. Anxiety can be triggered by a very personal issue, such as the illness of a loved one, or an event of global proportions, such as a refugee crisis. Some of the symptoms of anxiety are:  Feeling restless.  Having a feeling of impending danger.  Increased heart rate.  Rapid breathing. Sweating.  Shaking.  Weakness or feeling tired.  Difficulty concentrating on anything except the current worry.  Insomnia.  Stomach or bowel problems. What can we do about anxiety we may be feeling? There are many techniques to help manage stress and relax. Here are 12 ways you can reduce your anxiety almost immediately: 1. Turn off the constant feed of information. Take a social media sabbatical. Studies have shown that social media directly contributes to social anxiety.  2. Monitor your television viewing habits. Are you watching shows that are also contributing to your anxiety, such as 24-hour news stations? Try watching something else, or better yet, nothing at all. Instead, listen to music, read an inspirational book or practice a hobby. 3. Eat nutritious meals. Also, don't skip meals and keep healthful snacks on hand. Hunger and poor diet contributes to feeling anxious. 4. Sleep. Sleeping on a regular schedule for at least seven to eight hours a night will do wonders for your outlook when you are awake. 5. Exercise. Regular exercise will help rid your body of that anxious energy and help you get more restful sleep. 6. Try deep (diaphragmatic) breathing. Inhale slowly through your nose for five seconds and exhale through your  mouth. 7. Practice acceptance and gratitude. When anxiety hits, accept that there are things out of your control that shouldn't be of immediate concern.  8. Seek out humor. When anxiety strikes, watch a funny video, read jokes or call a friend who makes you laugh. Laughter is healing for our bodies and releases endorphins that are calming. 9. Stay positive. Take the effort to replace negative thoughts with positive ones. Try to see a stressful situation in a positive light. Try to come up with solutions rather than dwelling on the problem. 10. Figure out what triggers your anxiety. Keep a journal and make note of anxious moments and the events surrounding them. This will help you identify triggers you can avoid or even eliminate. 11. Talk to someone. Let a trusted friend, family member or even trained professional know that you are feeling overwhelmed and anxious. Verbalize what you are feeling and why.  12. Volunteer. If your anxiety is triggered by a crisis on a large scale, become an advocate and work to resolve the problem that is causing you unease. Anxiety is often unwelcome and can become overwhelming. If not kept in check, it can become a disorder that could require medical treatment. However, if you take the time to care for yourself and avoid the triggers that make you anxious, you will be able to find moments of relaxation and clarity that make your life much more enjoyable.

## 2016-01-14 NOTE — Progress Notes (Signed)
Patient ID: Jamie Hardin, female   DOB: [birth year], 45 y.o.   MRN: [id number]   Subjective:   Jamie Hardin is a 45 y.o. female here for a complete physical exam  Interim issues since last visit: breast imaging; two or three bouts of walking pneumonia  USPSTF grade A and B recommendations Alcohol: sensitive to alcohol, very occasional Depression:  Depression screen [facility] 2/9 01/14/2016 01/12/2015  Decreased Interest 0 0  Down, Depressed, Hopeless 0 0  PHQ - 2 Score 0 0  little bit of depression she says though despite normal PHQ; some isolation; phobia of people; has Rx and saw a counselor; goes to job, enjoys life Hypertension: controlled Obesity: trying to lose weight; nothing extreme; more veggies Tobacco use: quit 12.5 years ago HIV, hep B, hep C: declined STD testing and prevention (chl/gon/syphilis): declined Lipids: check Glucose: check Colorectal cancer: already had colonscopy; no fam hx Breast cancer: screening and f/u regularly BRCA gene screening: no known fam members Intimate partner violence: no abuse Cervical cancer screening: s/p complete hysterectomy Diet: not a good eater, working on it Exercise: not exercising regularly Skin cancer: no worrisome moles  She sees neurologist; Dr. Manuella Ghazi  Past Medical History:  Diagnosis Date  . Allergy   . Asthma   . Chronic sinusitis   . Dysthymic disorder   . GERD (gastroesophageal reflux disease)   . Obesity   . Ovarian endometriosis   . Peripheral neuropathy ([place])   . 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 ABDOMINAL HYSTERECTOMY  2007   complete due to endometriosis  . NASAL SINUS SURGERY  1996 and 1999   x 2   Family History  Problem Relation Age of Onset  . Fibromyalgia Mother   . Diabetes Mother   . Diabetes Maternal Aunt   . Diabetes Paternal Aunt   . Cancer Father     prostate  . Heart disease Neg Hx   . Hypertension Neg Hx   .  Hyperlipidemia Neg Hx   . Stroke Neg Hx   . COPD Neg Hx    Social History  Substance Use Topics  . Smoking status: Former Smoker    Packs/day: 1.00    Years: 20.00    Types: Cigarettes    Quit date: 04/04/2003  . Smokeless tobacco: Never Used  . Alcohol use Yes     Comment: Very Rare   Review of Systems  Constitutional: Negative for unexpected weight change.  Respiratory: Positive for wheezing (occasional with asthma, rare). Negative for shortness of breath.   Cardiovascular: Negative for chest pain.  Endocrine: Negative for polydipsia.  Allergic/Immunologic: Negative for food allergies.  Neurological: Negative for tremors.  Hematological: Negative for adenopathy. Does not bruise/bleed easily.   Objective:   Vitals:   01/14/16 0828  BP: 124/64  Pulse: 77  Resp: 16  Temp: 98.1 F (36.7 C)  TempSrc: Oral  SpO2: 92%  Weight: (!) 332 lb 2 oz (150.7 kg)  Height: '5\' 5"'  (1.651 m)   Body mass index is 55.27 kg/m. Wt Readings from Last 3 Encounters:  01/14/16 (!) 332 lb 2 oz (150.7 kg)  05/28/15 (!) 333 lb 3.2 oz (151.1 kg)  04/27/15 (!) 330 lb (149.7 kg)   Physical Exam  Constitutional: She appears well-developed and well-nourished.  HENT:  Head: Normocephalic and atraumatic.  Right Ear: Hearing, tympanic membrane, external ear and ear canal normal.  Left Ear: Hearing,  tympanic membrane, external ear and ear canal normal.  Eyes: Conjunctivae and EOM are normal. Right eye exhibits no hordeolum. Left eye exhibits no hordeolum. No scleral icterus.  Neck: Carotid bruit is not present. No thyromegaly present.  Cardiovascular: Normal rate, regular rhythm, S1 normal, S2 normal and normal heart sounds.   No extrasystoles are present.  Pulmonary/Chest: Effort normal and breath sounds normal. No respiratory distress. Right breast exhibits no inverted nipple, no mass, no nipple discharge, no skin change and no tenderness. Left breast exhibits no inverted nipple, no mass, no nipple  discharge, no skin change and no tenderness. Breasts are symmetrical.  Abdominal: Soft. Normal appearance and bowel sounds are normal. She exhibits no distension, no abdominal bruit, no pulsatile midline mass and no mass. There is no hepatosplenomegaly. There is no tenderness. No hernia.  Musculoskeletal: Normal range of motion. She exhibits no edema.  Lymphadenopathy:       Head (right side): No submandibular adenopathy present.       Head (left side): No submandibular adenopathy present.    She has no cervical adenopathy.    She has no axillary adenopathy.  Neurological: She is alert. She displays no tremor. No cranial nerve deficit. She exhibits normal muscle tone. Gait normal.  Reflex Scores:      Patellar reflexes are 2+ on the right side and 2+ on the left side. Skin: Skin is warm and dry. No bruising and no ecchymosis noted. No cyanosis. No pallor.  Psychiatric: Her speech is normal and behavior is normal. Thought content normal. Her mood appears not anxious. She does not exhibit a depressed mood.    Assessment/Plan:   Problem List Items Addressed This Visit      Other   Preventative health care - Primary    USPSTF grade A and B recommendations reviewed with patient; age-appropriate recommendations, preventive care, screening tests, etc discussed and encouraged; healthy living encouraged; see AVS for patient education given to patient      Relevant Orders   Lipid panel (Completed)   CBC with Differential/Platelet (Completed)   TSH (Completed)   COMPLETE METABOLIC PANEL WITH GFR (Completed)   History of pneumonia as indication for 23-polyvalent pneumococcal polysaccharide vaccine    PPSV-23 offered and given      Relevant Orders   Pneumococcal polysaccharide vaccine 23-valent greater than or equal to 2yo subcutaneous/IM (Completed)    Other Visit Diagnoses    Needs flu shot       Relevant Orders   Flu Vaccine QUAD 36+ mos PF IM (Fluarix & Fluzone Quad PF) (Completed)       Meds ordered this encounter  Medications  . DISCONTD: predniSONE (STERAPRED UNI-PAK 21 TAB) 10 MG (21) TBPK tablet    Sig: 6 day taper. Take as directed with food  . predniSONE (DELTASONE) 20 MG tablet    Refill:  1  . Budesonide 90 MCG/ACT inhaler    Sig: Inhale into the lungs.  Marland Kitchen PULMICORT FLEXHALER 90 MCG/ACT inhaler    Sig: INL 1 PUFF  BID    Refill:  12  . Promethazine-Phenyleph-Codeine 6.25-5-10 MG/5ML SYRP    Sig: TK 5-10 ML PO  Q 6 H PRF COUGH.    Refill:  0  . DISCONTD: Promethazine-Phenyleph-Codeine 6.25-5-10 MG/5ML SYRP    Sig: 1-2 TSP PO Q 6 HRS PRN COUGH   Follow up plan: Return in about 1 year (around 01/13/2017) for complete physical; next month or so for all other issues.  An after-visit summary was  printed and given to the patient at [facility].  Please see the patient instructions which may contain other information and recommendations beyond what is mentioned above in the assessment and plan.

## 2016-01-14 NOTE — Assessment & Plan Note (Signed)
USPSTF grade A and B recommendations reviewed with patient; age-appropriate recommendations, preventive care, screening tests, etc discussed and encouraged; healthy living encouraged; see AVS for patient education given to patient  

## 2016-02-04 ENCOUNTER — Telehealth: Payer: Self-pay | Admitting: Family Medicine

## 2016-02-04 DIAGNOSIS — R928 Other abnormal and inconclusive findings on diagnostic imaging of breast: Secondary | ICD-10-CM | POA: Insufficient documentation

## 2016-02-04 NOTE — Telephone Encounter (Signed)
-----   Message from Arnetha Courser, MD sent at 08/11/2015  2:42 PM EDT ----- Regarding: due for 6 month BILAT mammo Due for 6 month bilateral breast imaging Nov 10th

## 2016-02-04 NOTE — Telephone Encounter (Signed)
Orders entered for bilateral diag mammo

## 2016-02-04 NOTE — Assessment & Plan Note (Signed)
May 2017; [place]; order 6 month f/u

## 2016-02-14 DIAGNOSIS — N6342 Unspecified lump in left breast, subareolar: Secondary | ICD-10-CM | POA: Diagnosis not present

## 2016-02-14 DIAGNOSIS — R922 Inconclusive mammogram: Secondary | ICD-10-CM | POA: Diagnosis not present

## 2016-02-14 DIAGNOSIS — R928 Other abnormal and inconclusive findings on diagnostic imaging of breast: Secondary | ICD-10-CM | POA: Diagnosis not present

## 2016-02-14 LAB — HM MAMMOGRAPHY

## 2016-02-23 ENCOUNTER — Ambulatory Visit: Payer: BLUE CROSS/BLUE SHIELD | Admitting: Family Medicine

## 2016-02-28 DIAGNOSIS — F331 Major depressive disorder, recurrent, moderate: Secondary | ICD-10-CM | POA: Diagnosis not present

## 2016-02-28 DIAGNOSIS — F411 Generalized anxiety disorder: Secondary | ICD-10-CM | POA: Diagnosis not present

## 2016-02-28 DIAGNOSIS — F321 Major depressive disorder, single episode, moderate: Secondary | ICD-10-CM | POA: Diagnosis not present

## 2016-02-28 DIAGNOSIS — F410 Panic disorder [episodic paroxysmal anxiety] without agoraphobia: Secondary | ICD-10-CM | POA: Diagnosis not present

## 2016-03-01 DIAGNOSIS — Z713 Dietary counseling and surveillance: Secondary | ICD-10-CM | POA: Diagnosis not present

## 2016-03-04 ENCOUNTER — Telehealth: Payer: Self-pay | Admitting: Family Medicine

## 2016-03-04 NOTE — Telephone Encounter (Signed)
Please follow-up with patient about her six month diagnostic mammogram that was due around November 10th; I don't have a report yet Last was done through [facility] Thank you

## 2016-03-06 NOTE — Telephone Encounter (Signed)
Per the request of Dr. Enid Derry, I tried to contact this patient to see if she has already been scheduled to f/u with her repeat diagnostic mammogram but there was no answer. A message was left for her to give [place] a call when she got the chance.

## 2016-03-09 NOTE — Telephone Encounter (Signed)
I contacted this patient regarding Dr. Delight Ovens previous message and she informed me that she recently (about 2-3 weeks ago) had her repeat mammogram and stated she had been cleared.  Results: (ASSESSMENT: BI-RADS Category: 2. Benign)  Please review in Care Everywhere.

## 2016-03-10 ENCOUNTER — Encounter: Payer: Self-pay | Admitting: Family Medicine

## 2016-03-10 NOTE — Telephone Encounter (Signed)
Thank you If you could please update the health maintenance section, date done and when next due, we'll be all set; that would great; thank you

## 2016-03-15 DIAGNOSIS — Z7189 Other specified counseling: Secondary | ICD-10-CM | POA: Diagnosis not present

## 2016-03-18 DIAGNOSIS — J019 Acute sinusitis, unspecified: Secondary | ICD-10-CM | POA: Diagnosis not present

## 2016-03-21 ENCOUNTER — Encounter: Payer: Self-pay | Admitting: Family Medicine

## 2016-03-21 ENCOUNTER — Ambulatory Visit (INDEPENDENT_AMBULATORY_CARE_PROVIDER_SITE_OTHER): Payer: BLUE CROSS/BLUE SHIELD | Admitting: Family Medicine

## 2016-03-21 VITALS — BP 120/68 | HR 89 | Temp 98.1°F | Resp 16 | Wt 334.6 lb

## 2016-03-21 DIAGNOSIS — K219 Gastro-esophageal reflux disease without esophagitis: Secondary | ICD-10-CM | POA: Diagnosis not present

## 2016-03-21 DIAGNOSIS — J4520 Mild intermittent asthma, uncomplicated: Secondary | ICD-10-CM | POA: Diagnosis not present

## 2016-03-21 DIAGNOSIS — F411 Generalized anxiety disorder: Secondary | ICD-10-CM

## 2016-03-21 DIAGNOSIS — N898 Other specified noninflammatory disorders of vagina: Secondary | ICD-10-CM | POA: Diagnosis not present

## 2016-03-21 MED ORDER — PULMICORT FLEXHALER 90 MCG/ACT IN AEPB: 2.0000 | INHALATION_SPRAY | Freq: Two times a day (BID) | RESPIRATORY_TRACT | 3 refills | Status: DC

## 2016-03-21 MED ORDER — RANITIDINE HCL 300 MG PO TABS: 300.0000 mg | ORAL_TABLET | Freq: Every day | ORAL | 3 refills | Status: DC

## 2016-03-21 MED ORDER — PROAIR HFA 108 (90 BASE) MCG/ACT IN AERS: 2.0000 | INHALATION_SPRAY | RESPIRATORY_TRACT | 0 refills | Status: DC | PRN

## 2016-03-21 NOTE — Progress Notes (Signed)
BP 120/68 (BP Location: Left Arm, Patient Position: Sitting, Cuff Size: Normal)   Pulse 89   Temp 98.1 F (36.7 C) (Oral)   Resp 16   Wt (!) 334 lb 9 oz (151.8 kg)   SpO2 95%   BMI 55.67 kg/m    Subjective:    Patient ID: Jamie Hardin, female    DOB: 04/22/1970, 45 y.o.   MRN: [id number]  HPI: Jamie Hardin is a 45 y.o. female  Chief Complaint  Patient presents with  . Medication Refill   She was just at urgent care and prescribed augmentin for sinus infection; also on prednisone times just one more day She thinks she has had an infection down below; she does not have any itching; does have an odor; whitish discharge; no new sexual partners; no dysuria; no blood in urine or from irritation; went camping 4 months ago and couldn't ventilate  She is here for follow-up and medicine refill  GERD; taking PPI prn; not taking H2 blocker; orange juice trigger so avoids Hx of ulcer more than two months ago, about a year or year and a half  She has pulmicort but not using regularly; will start taking regularly; asthma has been doing well, "great"; got pneumonia shot last time and has not had lung issues since then  OSA; using CPAP  Neurologist manages her headaches  Depression screen [facility] 2/9 03/21/2016 01/14/2016 01/12/2015  Decreased Interest 0 0 0  Down, Depressed, Hopeless 0 0 0  PHQ - 2 Score 0 0 0   Relevant past medical, surgical, family and social history reviewed Past Medical History:  Diagnosis Date  . Allergy   . Asthma   . Chronic sinusitis   . Dysthymic disorder   . GERD (gastroesophageal reflux disease)   . Obesity   . Ovarian endometriosis   . Peripheral neuropathy ([place])   . 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 ABDOMINAL HYSTERECTOMY  2007   complete due to endometriosis  . NASAL SINUS SURGERY  1996 and 1999   x 2   Family History  Problem Relation Age of Onset  . Fibromyalgia  Mother   . Diabetes Mother   . Diabetes Maternal Aunt   . Diabetes Paternal Aunt   . Cancer Father     prostate  . Heart disease Neg Hx   . Hypertension Neg Hx   . Hyperlipidemia Neg Hx   . Stroke Neg Hx   . COPD Neg Hx    Social History  Substance Use Topics  . Smoking status: Former Smoker    Packs/day: 1.00    Years: 20.00    Types: Cigarettes    Quit date: 04/04/2003  . Smokeless tobacco: Never Used  . Alcohol use Yes     Comment: Very Rare   Interim medical history since last visit reviewed. Allergies and medications reviewed  Review of Systems Per HPI unless specifically indicated above     Objective:    BP 120/68 (BP Location: Left Arm, Patient Position: Sitting, Cuff Size: Normal)   Pulse 89   Temp 98.1 F (36.7 C) (Oral)   Resp 16   Wt (!) 334 lb 9 oz (151.8 kg)   SpO2 95%   BMI 55.67 kg/m   Wt Readings from Last 3 Encounters:  03/21/16 (!) 334 lb 9 oz (151.8 kg)  01/14/16 (!) 332 lb 2 oz (150.7 kg)  05/28/15 Marland Kitchen)  333 lb 3.2 oz (151.1 kg)    Physical Exam  Constitutional: She appears well-developed and well-nourished. No distress.  HENT:  Head: Normocephalic and atraumatic.  Eyes: EOM are normal. No scleral icterus.  Cardiovascular: Normal rate and regular rhythm.   Pulmonary/Chest: Effort normal and breath sounds normal. No respiratory distress. She has no wheezes.  Abdominal: Soft. Bowel sounds are normal. She exhibits no distension.  Genitourinary: No erythema, tenderness or bleeding in the vagina. No signs of injury around the vagina. Vaginal discharge (scant whitish discharge; no odor) found.  Musculoskeletal: Normal range of motion. She exhibits no edema.  Neurological: She is alert. She exhibits normal muscle tone.  Skin: Skin is warm and dry. She is not diaphoretic. No pallor.  Psychiatric: She has a normal mood and affect. Her behavior is normal. Judgment and thought content normal.   Results for orders placed or performed in visit on  03/21/16  WET PREP BY MOLECULAR PROBE  Result Value Ref Range   Candida species NEG Negative   Trichomonas vaginosis NEG Negative   Gardnerella vaginalis NEG Negative      Assessment & Plan:   Problem List Items Addressed This Visit      Respiratory   Asthma    Encouraged patient to use her daily controller inhaler regularly; goal is to use rescue inhaler no more than 2x a week for good control, and she is there; flu and pneumonia vaccines UTD      Relevant Medications   PULMICORT FLEXHALER 90 MCG/ACT inhaler   PROAIR HFA 108 (90 Base) MCG/ACT inhaler     Digestive   GERD (gastroesophageal reflux disease)    Discussed risks of PPI; will switch to H2 blocker, avoid triggers      Relevant Medications   ranitidine (ZANTAC) 300 MG tablet     Other   GAD (generalized anxiety disorder)    Suggested relaxation response, see AVS       Other Visit Diagnoses    Vaginal discharge    -  Primary   suspect BV; sent off wet prep for evaluation   Relevant Orders   WET PREP BY MOLECULAR PROBE (Completed)      Follow up plan: Return in about 6 months (around 09/19/2016) for follow-up.  An after-visit summary was printed and given to the patient at [facility].  Please see the patient instructions which may contain other information and recommendations beyond what is mentioned above in the assessment and plan.  Meds ordered this encounter  Medications  . prazosin (MINIPRESS) 1 MG capsule    Sig: Take 1 capsule by mouth 2 (two) times daily.    Refill:  0  . amoxicillin-clavulanate (AUGMENTIN) 875-125 MG tablet    Sig: TK 1 T PO  BID FOR 10 DAYS    Refill:  0  . ranitidine (ZANTAC) 300 MG tablet    Sig: Take 1 tablet (300 mg total) by mouth at bedtime.    Dispense:  90 tablet    Refill:  3  . PULMICORT FLEXHALER 90 MCG/ACT inhaler    Sig: Inhale 2 puffs into the lungs 2 (two) times daily.    Dispense:  3 Inhaler    Refill:  3  . PROAIR HFA 108 (90 Base) MCG/ACT inhaler    Sig:  Inhale 2 puffs into the lungs every 4 (four) hours as needed for wheezing or shortness of breath.    Dispense:  8.5 g    Refill:  0    Orders  Placed This Encounter  Procedures  . WET PREP BY MOLECULAR PROBE

## 2016-03-21 NOTE — Patient Instructions (Addendum)
Caution: prolonged use of proton pump inhibitors like omeprazole (Prilosec), pantoprazole (Protonix), esomeprazole (Nexium), and others like Dexilant and Aciphex may increase your risk of pneumonia, Clostridium difficile colitis, osteoporosis, anemia and other health complications Try to limit or avoid triggers like coffee, caffeinated beverages, onions, chocolate, spicy foods, peppermint, acid foods like pizza, spaghetti sauce, and orange juice Lose weight if you are overweight or obese Try elevating the head of your bed by placing a small wedge between your mattress and box springs to keep acid in the stomach at night instead of coming up into your esophagus Do use your pulmicort regularly and rinse mouth out after each use  Steps to Elicit the Relaxation Response The following is the technique reprinted with permission from Dr. Billie Ruddy book The Relaxation Response pages 162-163 1. Sit quietly in a comfortable position. 2. Close your eyes. 3. Deeply relax all your muscles,  beginning at your feet and progressing up to your face.  Keep them relaxed. 4. Breathe through your nose.  Become aware of your breathing.  As you breathe out, say the word, "one"*,  silently to yourself. For example,  breathe in ... out, "one",- in .. out, "one", etc.  Breathe easily and naturally. 5. Continue for 10 to 20 minutes.  You may open your eyes to check the time, but do not use an alarm.  When you finish, sit quietly for several minutes,  at first with your eyes closed and later with your eyes opened.  Do not stand up for a few minutes. 6. Do not worry about whether you are successful  in achieving a deep level of relaxation.  Maintain a passive attitude and permit relaxation to occur at its own pace.  When distracting thoughts occur,  try to ignore them by not dwelling upon them  and return to repeating "one."  With practice, the response should come with little effort.  Practice the  technique once or twice daily,  but not within two hours after any meal,  since the digestive processes seem to interfere with  the elicitation of the Relaxation Response. * It is better to use a soothing, mellifluous sound, preferably with no meaning. or association, to avoid stimulation of unnecessary thoughts - a mantra.   12 Ways to Curb Anxiety  ?Anxiety is normal human sensation. It is what helped our ancestors survive the pitfalls of the wilderness. Anxiety is defined as experiencing worry or nervousness about an imminent event or something with an uncertain outcome. It is a feeling experienced by most people at some point in their lives. Anxiety can be triggered by a very personal issue, such as the illness of a loved one, or an event of global proportions, such as a refugee crisis. Some of the symptoms of anxiety are:  Feeling restless.  Having a feeling of impending danger.  Increased heart rate.  Rapid breathing. Sweating.  Shaking.  Weakness or feeling tired.  Difficulty concentrating on anything except the current worry.  Insomnia.  Stomach or bowel problems. What can we do about anxiety we may be feeling? There are many techniques to help manage stress and relax. Here are 12 ways you can reduce your anxiety almost immediately: 1. Turn off the constant feed of information. Take a social media sabbatical. Studies have shown that social media directly contributes to social anxiety.  2. Monitor your television viewing habits. Are you watching shows that are also contributing to your anxiety, such as 24-hour news stations? Try watching something  else, or better yet, nothing at all. Instead, listen to music, read an inspirational book or practice a hobby. 3. Eat nutritious meals. Also, don't skip meals and keep healthful snacks on hand. Hunger and poor diet contributes to feeling anxious. 4. Sleep. Sleeping on a regular schedule for at least seven to eight hours a night will do  wonders for your outlook when you are awake. 5. Exercise. Regular exercise will help rid your body of that anxious energy and help you get more restful sleep. 6. Try deep (diaphragmatic) breathing. Inhale slowly through your nose for five seconds and exhale through your mouth. 7. Practice acceptance and gratitude. When anxiety hits, accept that there are things out of your control that shouldn't be of immediate concern.  8. Seek out humor. When anxiety strikes, watch a funny video, read jokes or call a friend who makes you laugh. Laughter is healing for our bodies and releases endorphins that are calming. 9. Stay positive. Take the effort to replace negative thoughts with positive ones. Try to see a stressful situation in a positive light. Try to come up with solutions rather than dwelling on the problem. 10. Figure out what triggers your anxiety. Keep a journal and make note of anxious moments and the events surrounding them. This will help you identify triggers you can avoid or even eliminate. 11. Talk to someone. Let a trusted friend, family member or even trained professional know that you are feeling overwhelmed and anxious. Verbalize what you are feeling and why.  12. Volunteer. If your anxiety is triggered by a crisis on a large scale, become an advocate and work to resolve the problem that is causing you unease. Anxiety is often unwelcome and can become overwhelming. If not kept in check, it can become a disorder that could require medical treatment. However, if you take the time to care for yourself and avoid the triggers that make you anxious, you will be able to find moments of relaxation and clarity that make your life much more enjoyable.

## 2016-03-21 NOTE — Assessment & Plan Note (Signed)
Encouraged patient to use her daily controller inhaler regularly; goal is to use rescue inhaler no more than 2x a week for good control, and she is there; flu and pneumonia vaccines UTD

## 2016-03-21 NOTE — Assessment & Plan Note (Signed)
Discussed risks of PPI; will switch to H2 blocker, avoid triggers

## 2016-03-21 NOTE — Assessment & Plan Note (Signed)
Suggested relaxation response, see AVS

## 2016-03-22 LAB — WET PREP BY MOLECULAR PROBE
Candida species: NEGATIVE
GARDNERELLA VAGINALIS: NEGATIVE
Trichomonas vaginosis: NEGATIVE

## 2016-03-24 ENCOUNTER — Encounter: Payer: Self-pay | Admitting: Family Medicine

## 2016-03-24 MED ORDER — FLUCONAZOLE 150 MG PO TABS: 150.0000 mg | ORAL_TABLET | Freq: Once | ORAL | 0 refills | Status: AC

## 2016-03-24 NOTE — Telephone Encounter (Signed)
I spoke with patient personally by phone; apologized for her experience Discussed her symptoms Will treat with diflucan x 1, repeat after 3 days if needed Treating for empiric yeast infection given recent augmentin Call if needed

## 2016-03-28 DIAGNOSIS — R112 Nausea with vomiting, unspecified: Secondary | ICD-10-CM | POA: Diagnosis not present

## 2016-03-28 DIAGNOSIS — R1011 Right upper quadrant pain: Secondary | ICD-10-CM | POA: Diagnosis not present

## 2016-03-28 DIAGNOSIS — R160 Hepatomegaly, not elsewhere classified: Secondary | ICD-10-CM | POA: Diagnosis not present

## 2016-03-28 DIAGNOSIS — K5730 Diverticulosis of large intestine without perforation or abscess without bleeding: Secondary | ICD-10-CM | POA: Diagnosis not present

## 2016-03-28 DIAGNOSIS — Z90710 Acquired absence of both cervix and uterus: Secondary | ICD-10-CM | POA: Diagnosis not present

## 2016-03-28 DIAGNOSIS — Z888 Allergy status to other drugs, medicaments and biological substances status: Secondary | ICD-10-CM | POA: Diagnosis not present

## 2016-03-28 DIAGNOSIS — J45909 Unspecified asthma, uncomplicated: Secondary | ICD-10-CM | POA: Diagnosis not present

## 2016-03-28 DIAGNOSIS — Q433 Congenital malformations of intestinal fixation: Secondary | ICD-10-CM | POA: Diagnosis not present

## 2016-03-28 DIAGNOSIS — Z87891 Personal history of nicotine dependence: Secondary | ICD-10-CM | POA: Diagnosis not present

## 2016-03-28 DIAGNOSIS — R109 Unspecified abdominal pain: Secondary | ICD-10-CM | POA: Diagnosis not present

## 2016-04-12 DIAGNOSIS — Z713 Dietary counseling and surveillance: Secondary | ICD-10-CM | POA: Diagnosis not present

## 2016-04-26 DIAGNOSIS — Z713 Dietary counseling and surveillance: Secondary | ICD-10-CM | POA: Diagnosis not present

## 2016-05-08 ENCOUNTER — Telehealth: Payer: Self-pay | Admitting: Family Medicine

## 2016-05-08 MED ORDER — OSELTAMIVIR PHOSPHATE 75 MG PO CAPS: 75.0000 mg | ORAL_CAPSULE | Freq: Two times a day (BID) | ORAL | 0 refills | Status: AC

## 2016-05-08 NOTE — Telephone Encounter (Signed)
Pt husband was dx with the flu. Pt would lke to know if you are able to call her something in to [facility]. Pt have developed sniffles, back of head has began to hurt and her body is beginning to feel heavy. Her daughter is also starting to feel the same way.

## 2016-05-08 NOTE — Telephone Encounter (Signed)
Start Tamiflu Go to ER if dehydrated, pneumonia, etc.

## 2016-05-11 ENCOUNTER — Ambulatory Visit: Payer: BLUE CROSS/BLUE SHIELD | Admitting: Family Medicine

## 2016-05-24 DIAGNOSIS — Z713 Dietary counseling and surveillance: Secondary | ICD-10-CM | POA: Diagnosis not present

## 2016-05-24 DIAGNOSIS — G4733 Obstructive sleep apnea (adult) (pediatric): Secondary | ICD-10-CM | POA: Diagnosis not present

## 2016-05-30 DIAGNOSIS — F411 Generalized anxiety disorder: Secondary | ICD-10-CM | POA: Diagnosis not present

## 2016-05-30 DIAGNOSIS — F410 Panic disorder [episodic paroxysmal anxiety] without agoraphobia: Secondary | ICD-10-CM | POA: Diagnosis not present

## 2016-05-30 DIAGNOSIS — F321 Major depressive disorder, single episode, moderate: Secondary | ICD-10-CM | POA: Diagnosis not present

## 2016-05-30 DIAGNOSIS — F331 Major depressive disorder, recurrent, moderate: Secondary | ICD-10-CM | POA: Diagnosis not present

## 2016-06-07 DIAGNOSIS — Z713 Dietary counseling and surveillance: Secondary | ICD-10-CM | POA: Diagnosis not present

## 2016-06-21 DIAGNOSIS — Z713 Dietary counseling and surveillance: Secondary | ICD-10-CM | POA: Diagnosis not present

## 2016-08-03 ENCOUNTER — Ambulatory Visit
Admission: EM | Admit: 2016-08-03 | Discharge: 2016-08-03 | Disposition: A | Discharge status: Home / Self Care | Payer: BLUE CROSS/BLUE SHIELD | Attending: Family Medicine | Admitting: Family Medicine

## 2016-08-03 DIAGNOSIS — J069 Acute upper respiratory infection, unspecified: Secondary | ICD-10-CM | POA: Diagnosis not present

## 2016-08-03 DIAGNOSIS — J029 Acute pharyngitis, unspecified: Secondary | ICD-10-CM | POA: Diagnosis not present

## 2016-08-03 DIAGNOSIS — B9789 Other viral agents as the cause of diseases classified elsewhere: Secondary | ICD-10-CM

## 2016-08-03 DIAGNOSIS — R05 Cough: Secondary | ICD-10-CM | POA: Diagnosis not present

## 2016-08-03 LAB — RAPID INFLUENZA A&B ANTIGENS (ARMC ONLY)
INFLUENZA A (ARMC): NEGATIVE
INFLUENZA B (ARMC): NEGATIVE

## 2016-08-03 LAB — RAPID STREP SCREEN (MED CTR MEBANE ONLY): STREPTOCOCCUS, GROUP A SCREEN (DIRECT): NEGATIVE

## 2016-08-03 MED ORDER — LORATADINE-PSEUDOEPHEDRINE ER 10-240 MG PO TB24: 1.0000 | ORAL_TABLET | Freq: Every day | ORAL | 0 refills | Status: DC

## 2016-08-03 MED ORDER — BENZONATATE 200 MG PO CAPS: 200.0000 mg | ORAL_CAPSULE | Freq: Three times a day (TID) | ORAL | 0 refills | Status: DC | PRN

## 2016-08-03 MED ORDER — AMOXICILLIN-POT CLAVULANATE 875-125 MG PO TABS: 1.0000 | ORAL_TABLET | Freq: Two times a day (BID) | ORAL | 0 refills | Status: DC

## 2016-08-03 MED ORDER — PREDNISONE 10 MG (21) PO TBPK: ORAL_TABLET | ORAL | 0 refills | Status: DC

## 2016-08-03 NOTE — ED Provider Notes (Signed)
[facility]    CSN: [id number] Arrival date & time: 08/03/16  0856     History   Chief Complaint Chief Complaint  Patient presents with  . Nasal Congestion  . Cough  . Diarrhea    HPI Jamie Hardin is a 46 y.o. female.   Patient's 46 year old white female who is here with her 42 year old daughter complaining of nasal congestion cough and fever. Anything start Sunday they both got sick almost the same time they both have nasal congestion coughing and sore throat. They state fever for the first part to 3 days which did finally clear. Along the nasal congestion coughing she's had pressure behind her sinuses as if she is having a sinus infection. States sore throat is actually a little better this morning but still present. She has a history of abnormal mammogram obesity herpes simplex infection dysthymic disorder GERD ovarian endometriosis and peripheral neuropathy. Surgery includes abdominal hysterectomy and nasal sinus surgery. Past family medical history positive for mother diabetes fibromyalgia and follow-up of cancer prostate. Patient does not smoke now but she is a former smoker. She is allergic to codeine and Naprosyn.   The history is provided by the patient and a relative. No language interpreter was used.  Influenza  Presenting symptoms: cough, diarrhea, fatigue, fever, myalgias, rhinorrhea and sore throat   Presenting symptoms: no nausea and no vomiting   Severity:  Moderate Onset quality:  Sudden Chronicity:  New Relieved by:  Nothing Ineffective treatments:  OTC medications Associated symptoms: chills and nasal congestion     Past Medical History:  Diagnosis Date  . Allergy   . Asthma   . Chronic sinusitis   . Dysthymic disorder   . GERD (gastroesophageal reflux disease)   . Obesity   . Ovarian endometriosis   . Peripheral neuropathy   . 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  Abnormal mammogram of both breasts 02/04/2016  . History of pneumonia as indication for 23-polyvalent pneumococcal polysaccharide vaccine 01/14/2016  . Preventative health care 01/14/2016  . Screening for HIV (human immunodeficiency virus) 04/27/2015  . Need for hepatitis C screening test 04/27/2015  . Need for hepatitis B screening test 04/27/2015  . Facial pain, atypical 04/27/2015  . Eustachian tube dysfunction 04/27/2015  . 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 Abnormal thyroid function test 02/15/2015  . Rash 02/15/2015  . Preventative health care 01/12/2015  . Breast cancer screening 01/12/2015  . Elevated blood-pressure reading without diagnosis of hypertension 01/12/2015  . GAD (generalized anxiety disorder) 01/12/2015  . Obstructive sleep apnea on CPAP 01/12/2015  . Situs inversus   . Allergy   . Asthma   . Morbid obesity ([place])   . Dysthymic disorder   . Peripheral neuropathy   . GERD (gastroesophageal reflux disease)     Past Surgical History:  Procedure Laterality Date  . ABDOMINAL HYSTERECTOMY  2007   complete due to endometriosis  . NASAL SINUS SURGERY  1996 and 1999   x 2    OB History    No data available       Home Medications    Prior to Admission medications   Medication Sig Start Date End Date Taking? Authorizing Provider  baclofen (LIORESAL) 10 MG tablet Take 10 mg by mouth 2 (two) times daily as needed. 10/26/14   Historical Provider, MD  benzonatate (TESSALON) 200 MG capsule Take 1 capsule (  200 mg total) by mouth 3 (three) times daily as needed. 08/03/16   Frederich Cha, MD  clonazePAM (KLONOPIN) 0.5 MG tablet Take 0.5 mg by mouth 2 (two) times daily as needed. 05/19/15   Historical Provider, MD  gabapentin (NEURONTIN) 300 MG capsule Take 1 capsule (300 mg total) by mouth 2 (two) times daily. 12/23/15   Arnetha Courser, MD  loratadine-pseudoephedrine (CLARITIN-D 24 HOUR) 10-240 MG 24 hr tablet Take 1 tablet by mouth daily. 08/03/16   Frederich Cha, MD    mometasone (NASONEX) 50 MCG/ACT nasal spray Place into the nose. 10/26/14   Historical Provider, MD  prazosin (MINIPRESS) 1 MG capsule Take 1 capsule by mouth 2 (two) times daily. 02/28/16   Historical Provider, MD  predniSONE (STERAPRED UNI-PAK 21 TAB) 10 MG (21) TBPK tablet Sig 6 tablet day 1, 5 tablets day 2, 4 tablets day 3,,3tablets day 4, 2 tablets day 5, 1 tablet day 6 take all tablets orally 08/03/16   Frederich Cha, MD  PROAIR HFA 108 [phone] Base) MCG/ACT inhaler Inhale 2 puffs into the lungs every 4 (four) hours as needed for wheezing or shortness of breath. 03/21/16   Arnetha Courser, MD  PULMICORT FLEXHALER 90 MCG/ACT inhaler Inhale 2 puffs into the lungs 2 (two) times daily. 03/21/16   Arnetha Courser, MD  ranitidine (ZANTAC) 300 MG tablet Take 1 tablet (300 mg total) by mouth at bedtime. 03/21/16   Arnetha Courser, MD  valACYclovir (VALTREX) 1000 MG tablet Two pills by mouth at first sign of outbreak followed by two more pills twelve hours later 04/27/15   Arnetha Courser, MD  Vortioxetine HBr (TRINTELLIX) 10 MG TABS Take 20 mg by mouth daily.    Historical Provider, MD    Family History Family History  Problem Relation Age of Onset  . Fibromyalgia Mother   . Diabetes Mother   . Cancer Father     prostate  . Diabetes Maternal Aunt   . Diabetes Paternal Aunt   . Heart disease Neg Hx   . Hypertension Neg Hx   . Hyperlipidemia Neg Hx   . Stroke Neg Hx   . COPD Neg Hx     Social History Social History  Substance Use Topics  . Smoking status: Former Smoker    Packs/day: 1.00    Years: 20.00    Types: Cigarettes    Quit date: 04/04/2003  . Smokeless tobacco: Never Used  . Alcohol use Yes     Comment: Very Rare     Allergies   Codeine and Naproxen   Review of Systems Review of Systems  Constitutional: Positive for chills, fatigue and fever.  HENT: Positive for congestion, rhinorrhea, sinus pain, sinus pressure and sore throat.   Respiratory: Positive for cough.    Gastrointestinal: Positive for diarrhea. Negative for nausea and vomiting.  Musculoskeletal: Positive for myalgias.  All other systems reviewed and are negative.    Physical Exam Triage Vital Signs ED Triage Vitals  Enc Vitals Group     BP 08/03/16 0917 122/69     Pulse Rate 08/03/16 0917 86     Resp 08/03/16 0917 20     Temp 08/03/16 0917 97.5 F (36.4 C)     Temp Source 08/03/16 0917 Oral     SpO2 08/03/16 0917 98 %     Weight 08/03/16 0920 (!) 330 lb (149.7 kg)     Height 08/03/16 0920 5\' 5"  (1.651 m)     Head Circumference --  Peak Flow --      Pain Score 08/03/16 0920 3     Pain Loc --      Pain Edu? --      Excl. in [place]? --    No data found.   Updated Vital Signs BP 122/69 (BP Location: Right Arm)   Pulse 86   Temp 97.5 F (36.4 C) (Oral)   Resp 20   Ht 5\' 5"  (1.651 m)   Wt (!) 330 lb (149.7 kg)   SpO2 98%   BMI 54.91 kg/m   Visual Acuity Right Eye Distance:   Left Eye Distance:   Bilateral Distance:    Right Eye Near:   Left Eye Near:    Bilateral Near:     Physical Exam  Constitutional: She is oriented to person, place, and time. She appears well-developed and well-nourished.  Non-toxic appearance. She does not have a sickly appearance. She does not appear ill.  Obese WF  HENT:  Head: Normocephalic and atraumatic.  Right Ear: Hearing, tympanic membrane, external ear and ear canal normal.  Left Ear: Hearing, tympanic membrane, external ear and ear canal normal.  Nose: Mucosal edema present. Right sinus exhibits maxillary sinus tenderness and frontal sinus tenderness. Left sinus exhibits maxillary sinus tenderness and frontal sinus tenderness.  Mouth/Throat: Uvula is midline. No uvula swelling or dental caries. Posterior oropharyngeal erythema present.  Eyes: Conjunctivae are normal. Pupils are equal, round, and reactive to light.  Neck: Normal range of motion. Neck supple.  Cardiovascular: Normal rate, regular rhythm and normal heart sounds.    Pulmonary/Chest: Effort normal.  Musculoskeletal: Normal range of motion.  Lymphadenopathy:    She has cervical adenopathy.  Neurological: She is alert and oriented to person, place, and time.  Skin: Skin is warm.  Psychiatric: She has a normal mood and affect.  Vitals reviewed.    UC Treatments / Results  Labs (all labs ordered are listed, but only abnormal results are displayed) Labs Reviewed  RAPID STREP SCREEN (NOT AT [facility])  RAPID INFLUENZA A&B ANTIGENS ([facility] ONLY)  CULTURE, GROUP A STREP [facility])    EKG  EKG Interpretation None       Radiology No results found.  Procedures Procedures (including critical care time)  Medications Ordered in UC Medications - No data to display Results for orders placed or performed during the hospital encounter of 08/03/16  Rapid strep screen  Result Value Ref Range   Streptococcus, Group A Screen (Direct) NEGATIVE NEGATIVE  Rapid Influenza A&B Antigens ([facility] only)  Result Value Ref Range   Influenza A ([facility]) NEGATIVE NEGATIVE   Influenza B ([facility]) NEGATIVE NEGATIVE    Initial Impression / Assessment and Plan / UC Course  I have reviewed the triage vital signs and the nursing notes.  Pertinent labs & imaging results that were available during my care of the patient were reviewed by me and considered in my medical decision making (see chart for details).   appears to be about illness with her and her daughter both sick same time we'll give her a prescription for Augmentin but postdated for 08/06/2016 but she is not better by Sunday she can get the prescription for Augmentin filled for now place on 6 day course of prednisone Allegra-D or Claritin-D and Tessalon Perles for cough. Will give a work note for today and tomorrow as well.    Final Clinical Impressions(s) / UC Diagnoses   Final diagnoses:  Upper respiratory tract infection, unspecified type  Viral URI with cough  Pharyngitis, unspecified etiology    New  Prescriptions New Prescriptions   BENZONATATE (TESSALON) 200 MG CAPSULE    Take 1 capsule (200 mg total) by mouth 3 (three) times daily as needed.   LORATADINE-PSEUDOEPHEDRINE (CLARITIN-D 24 HOUR) 10-240 MG 24 HR TABLET    Take 1 tablet by mouth daily.   PREDNISONE (STERAPRED UNI-PAK 21 TAB) 10 MG (21) TBPK TABLET    Sig 6 tablet day 1, 5 tablets day 2, 4 tablets day 3,,3tablets day 4, 2 tablets day 5, 1 tablet day 6 take all tablets orally     Frederich Cha, MD 08/03/16 1057

## 2016-08-03 NOTE — ED Triage Notes (Signed)
Sunday night started with nasal congestion, diarrhea, cough, and sore throat. Sore throat pain 3/10. No fever.

## 2016-08-06 LAB — CULTURE, GROUP A STREP (THRC)

## 2016-08-31 DIAGNOSIS — J209 Acute bronchitis, unspecified: Secondary | ICD-10-CM | POA: Diagnosis not present

## 2016-09-02 DIAGNOSIS — J209 Acute bronchitis, unspecified: Secondary | ICD-10-CM | POA: Diagnosis not present

## 2016-09-04 DIAGNOSIS — G4733 Obstructive sleep apnea (adult) (pediatric): Secondary | ICD-10-CM | POA: Diagnosis not present

## 2016-09-12 DIAGNOSIS — T7422XA Child sexual abuse, confirmed, initial encounter: Secondary | ICD-10-CM | POA: Diagnosis not present

## 2016-09-15 ENCOUNTER — Ambulatory Visit
Admission: RE | Admit: 2016-09-15 | Discharge: 2016-09-15 | Disposition: A | Discharge status: Home / Self Care | Payer: BLUE CROSS/BLUE SHIELD | Source: Ambulatory Visit | Attending: Family Medicine | Admitting: Family Medicine

## 2016-09-15 ENCOUNTER — Encounter: Payer: Self-pay | Admitting: Family Medicine

## 2016-09-15 ENCOUNTER — Ambulatory Visit (INDEPENDENT_AMBULATORY_CARE_PROVIDER_SITE_OTHER): Payer: BLUE CROSS/BLUE SHIELD | Admitting: Family Medicine

## 2016-09-15 VITALS — BP 126/74 | HR 90 | Temp 98.0°F | Resp 16 | Wt 332.3 lb

## 2016-09-15 DIAGNOSIS — R05 Cough: Secondary | ICD-10-CM

## 2016-09-15 DIAGNOSIS — J189 Pneumonia, unspecified organism: Secondary | ICD-10-CM

## 2016-09-15 DIAGNOSIS — H6983 Other specified disorders of Eustachian tube, bilateral: Secondary | ICD-10-CM | POA: Diagnosis not present

## 2016-09-15 DIAGNOSIS — R053 Chronic cough: Secondary | ICD-10-CM

## 2016-09-15 DIAGNOSIS — Z23 Encounter for immunization: Secondary | ICD-10-CM

## 2016-09-15 NOTE — Progress Notes (Signed)
BP 126/74   Pulse 90   Temp 98 F (36.7 C) (Oral)   Resp 16   Wt (!) 332 lb 4.8 oz (150.7 kg)   SpO2 95%   BMI 55.30 kg/m    Subjective:    Patient ID: Jamie Hardin, female    DOB: [birth year], 46 y.o.   MRN: [id number]  HPI: Jamie Hardin is a 46 y.o. female  Chief Complaint  Patient presents with  . URI    Lingering cough , nasal and chest congestion for 8 weeks. Pt was sick 8 weeks ago but still hving some of the symptoms.  No fever & body aches   HPI Patient is here for a sick visit Went to urgent care; took prednisone and then antibiotics Started 2 months ago She always has trouble with it settling in her lung augmentin doesn't kill the virus She saw a second doctor, needs a strong dose of prednisone Respiratory therapist (??), then a doctor, said she needed 50 mg daily for 7 days and an antibiotic and that worked Second doctor gave her prednisone for 5 days and then antibiotics, lost antibiotics half-way through; clarithromycin Has not seen a pulmonologist lately Now a constant issue, every so many months Has really bad allergies Going to start drops for allergies Her sinuses are congested No fevers Uses a CPAP and does wash it; has a "so clean" on it No hx of bronchoscopy; pneumonia several times, maybe four times Tested in the past for HIV and negative Takes prilosec 1x a month; she is aware of risks Cannot tolerate nasal corticosteroids Taking claritin D but no help Using pulmicort once a day Uses rescue inhaler a few times a week, no real wheezing now  Morbidly obese; lost 17 pounds between December and May, then gained it all back; prednisone two rounds; stress She was seeing nutritionist Has already done OA, the nutriton counselor is free through work, saw her for 8-9 months; father dying from leukemia  Depression screen [facility] 2/9 09/15/2016 03/21/2016 01/14/2016 01/12/2015  Decreased Interest 0 0 0 0  Down, Depressed, Hopeless 0 0 0 0  PHQ - 2  Score 0 0 0 0    Relevant past medical, surgical, family and social history reviewed Past Medical History:  Diagnosis Date  . Allergy   . Asthma   . Chronic sinusitis   . Dysthymic disorder   . GERD (gastroesophageal reflux disease)   . Obesity   . Ovarian endometriosis   . Peripheral neuropathy   . 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 ABDOMINAL HYSTERECTOMY  2007   complete due to endometriosis  . NASAL SINUS SURGERY  1996 and 1999   x 2   Family History  Problem Relation Age of Onset  . Fibromyalgia Mother   . Diabetes Mother   . Cancer Father        prostate  . Diabetes Maternal Aunt   . Diabetes Paternal Aunt   . Heart disease Neg Hx   . Hypertension Neg Hx   . Hyperlipidemia Neg Hx   . Stroke Neg Hx   . COPD Neg Hx    Social History   Social History  . Marital status: Married    Spouse name: N/A  . Number of children: N/A  . Years of education: N/A   Occupational History  . Not on file.   Social History Main Topics  .  Smoking status: Former Smoker    Packs/day: 1.00    Years: 20.00    Types: Cigarettes    Quit date: 04/04/2003  . Smokeless tobacco: Never Used  . Alcohol use Yes     Comment: Very Rare  . Drug use: No  . Sexual activity: Not on file   Other Topics Concern  . Not on file   Social History Narrative  . No narrative on file    Interim medical history since last visit reviewed. Allergies and medications reviewed  Review of Systems Per HPI unless specifically indicated above     Objective:    BP 126/74   Pulse 90   Temp 98 F (36.7 C) (Oral)   Resp 16   Wt (!) 332 lb 4.8 oz (150.7 kg)   SpO2 95%   BMI 55.30 kg/m   Wt Readings from Last 3 Encounters:  09/15/16 (!) 332 lb 4.8 oz (150.7 kg)  08/03/16 (!) 330 lb (149.7 kg)  03/21/16 (!) 334 lb 9 oz (151.8 kg)    Physical Exam  Constitutional: She appears well-developed and well-nourished.  Morbidly  obese  HENT:  Right Ear: A middle ear effusion is present.  Left Ear: A middle ear effusion is present.  Nose: Rhinorrhea present.  Mouth/Throat: Oropharynx is clear and moist and mucous membranes are normal.  Eyes: EOM are normal. No scleral icterus.  Cardiovascular: Normal rate and regular rhythm.   Pulmonary/Chest: Effort normal and breath sounds normal.  Lymphadenopathy:    She has no cervical adenopathy.  Psychiatric: She has a normal mood and affect. Her behavior is normal.   Results for orders placed or performed during the hospital encounter of 08/03/16  Rapid strep screen  Result Value Ref Range   Streptococcus, Group A Screen (Direct) NEGATIVE NEGATIVE  Rapid Influenza A&B Antigens ([facility] only)  Result Value Ref Range   Influenza A ([facility]) NEGATIVE NEGATIVE   Influenza B ([facility]) NEGATIVE NEGATIVE  Culture, group A strep  Result Value Ref Range   Specimen Description THROAT    Special Requests NONE Reflexed from [id number]    Culture      NO GROUP A STREP (S.PYOGENES) ISOLATED Performed at [facility] [address] [place], [place] [address]    Report Status 08/06/2016 FINAL       Assessment & Plan:   Problem List Items Addressed This Visit      Nervous and Auditory   Eustachian tube dysfunction    Noted on exam today; she does not want to use nasal corticosteroids        Other   Morbid obesity ([place])    Encouragement given for weight loss; patient needs to lose >50% of her body weight; I do not prescribe plain phentermine; I am here to help in any other way, referral to bariatric surgeon, nutritionist, other medication options, etc.; suggested she consider hypnosis, other strategies to help her deal with her morbid obesity       Other Visit Diagnoses    Chronic cough    -  Primary   Relevant Orders   Ambulatory referral to Pulmonology   DG Chest 2 View (Completed)   Need for tetanus booster       Relevant Orders   Td : Tetanus/diphtheria >7yo  Preservative  free (Completed)   Recurrent pneumonia       Relevant Medications   DM-Doxylamine-Acetaminophen (NYQUIL COLD & FLU PO)   Other Relevant Orders   Ambulatory referral to Pulmonology  DG Chest 2 View (Completed)      Follow up plan: Return if symptoms worsen or fail to improve.  An after-visit summary was printed and given to the patient at [facility].  Please see the patient instructions which may contain other information and recommendations beyond what is mentioned above in the assessment and plan.  Meds ordered this encounter  Medications  . omeprazole (PRILOSEC) 20 MG capsule    Sig: Take 20 mg by mouth daily as needed.    Refill:  0  . DM-Doxylamine-Acetaminophen (NYQUIL COLD & FLU PO)    Sig: Take by mouth Nightly.    Orders Placed This Encounter  Procedures  . DG Chest 2 View  . Td : Tetanus/diphtheria >7yo Preservative  free  . Ambulatory referral to Pulmonology

## 2016-09-15 NOTE — Patient Instructions (Addendum)
Let's have you see the pulmonologist Avoid allergy triggers Have the chest xray done across the street  Check out the information at familydoctor.org entitled "Nutrition for Weight Loss: What You Need to Know about Fad Diets" Try to lose between 1-2 pounds per week by taking in fewer calories and burning off more calories You can succeed by limiting portions, limiting foods dense in calories and fat, becoming more active, and drinking 8 glasses of water a day (64 ounces) Don't skip meals, especially breakfast, as skipping meals may alter your metabolism Do not use over-the-counter weight loss pills or gimmicks that claim rapid weight loss A healthy BMI (or body mass index) is between 18.5 and 24.9 You can calculate your ideal BMI at the [place] website [employer]fr  Consider hypnosis, counseling, finding healthier strategies, stress reduction

## 2016-09-18 NOTE — Assessment & Plan Note (Signed)
Encouragement given for weight loss; patient needs to lose >50% of her body weight; I do not prescribe plain phentermine; I am here to help in any other way, referral to bariatric surgeon, nutritionist, other medication options, etc.; suggested she consider hypnosis, other strategies to help her deal with her morbid obesity

## 2016-09-18 NOTE — Assessment & Plan Note (Addendum)
Noted on exam today; she does not want to use nasal corticosteroids

## 2016-10-02 NOTE — Progress Notes (Signed)
[facility] Consultation      Assessment and Plan:  The patient is a 46 year old female, referred for cough. She has a history of allergic asthma, and obstructive sleep apnea.  Asthma -With recent exacerbation, complicated by cough for 12 weeks, now resolving. -Discussed measures to try to reduce her allergic asthma symptoms, these include removing pets from the bedroom, treating reflux, allergic rhinitis, weight loss, continuing use of CPAP. -Patient currently notes that she is getting headaches from Pulmicort, therefore, can discontinue this.  Allergic rhinitis.  -Patient was previously on Flonase, but noticed headaches from this, asked her to try taking over-the-counter Nasacort 2 sprays in each nostril every night. -She is about to start allergy therapy, which may help with her allergic symptoms and asthma.  Jerrye Bushy.  -Allergic asthma may be exacerbated by reflux. -Recommend over-the-counter H2 blocker such as Prevacid once daily.  Obesity.  -Discussed that weight loss may be beneficial for her  Obstructive sleep apnea. -Discussed importance of continued use of CPAP every night.  Date: 10/02/2016  MRN# [id number] Jamie Hardin June 28, 1970    Jamie Hardin is a 46 y.o. old female seen in consultation for chief complaint of:    Chief Complaint  Patient presents with  . Advice Only    ref by Sanda Klein for chronic cough: prod cough w/yellow mucus x 12 weeks: SOB w/exertion    HPI:   Patient currently notes that she has been coughing for approximately 12 weeks. On review of outside records, it was noted that the patient has gone to urgent care, she has been tried on antibiotics and prednisone. The cough persisted, so she went back and got a second course of prednisone, which was higher dose, which seemed to help. She also received a course of antibiotics. She has been started on Pulmicort once daily, she also has a history of sleep apnea.  She tells me that the  cough has been present for 12 weeks, and feels that the cough is 95% better. This has never happened to her before, she appears to gets episodes of pneumonia/bronchitis moreso since she started CPAP 2 to 3 years ago.   She has been diagnosed with asthma as a child and has gotten better and worse over the years. She grew up in [place] and was much better there, she moved here 13 years ago, and since then the allergies have been worse. She has been tested for allergies, and was allergic to trees, several animals and was told that they were severe.  She has 5 indoor animals plus outdoor chickens and outdoor cats. There are 4 dogs and a chinchilla. 1 dog is in in her bed and another in her bedroom.  Claritin D gave her palpitation, but she can take sudafed. She does not take flonase because it causes increased drainage.   She has occasional reflux symptoms but does not take anti-reflux regularly.   She has pulmicort inhaler but that has not been helping with the cough. She thinks that she gets headaches with pulmicort.  She has a rescue inhaler but rarely uses it.  She takes no antihistamine she used to take allegra but thinks that it may have given her headaches.   Images personally reviewed. Chest x-ray 09/15/16; unremarkable, lungs. CBC 01/14/16; eosinophil count equals 100.  PMHX:   Past Medical History:  Diagnosis Date  . Allergy   . Asthma   . Chronic sinusitis   . Dysthymic disorder   . GERD (gastroesophageal reflux disease)   .  Obesity   . Ovarian endometriosis   . Peripheral neuropathy   . 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 ABDOMINAL HYSTERECTOMY  2007   complete due to endometriosis  . NASAL SINUS SURGERY  1996 and 1999   x 2   Family Hx:  Family History  Problem Relation Age of Onset  . Fibromyalgia Mother   . Diabetes Mother   . Cancer Father        prostate  . Diabetes Maternal Aunt     . Diabetes Paternal Aunt   . Heart disease Neg Hx   . Hypertension Neg Hx   . Hyperlipidemia Neg Hx   . Stroke Neg Hx   . COPD Neg Hx    Social Hx:   Social History  Substance Use Topics  . Smoking status: Former Smoker    Packs/day: 1.00    Years: 20.00    Types: Cigarettes    Quit date: 04/04/2003  . Smokeless tobacco: Never Used  . Alcohol use Yes     Comment: Very Rare   Medication:    Current Outpatient Prescriptions:  .  baclofen (LIORESAL) 10 MG tablet, Take 10 mg by mouth 2 (two) times daily as needed., Disp: , Rfl:  .  clonazePAM (KLONOPIN) 0.5 MG tablet, Take 0.5 mg by mouth 2 (two) times daily as needed., Disp: , Rfl: 0 .  DM-Doxylamine-Acetaminophen (NYQUIL COLD & FLU PO), Take by mouth Nightly., Disp: , Rfl:  .  gabapentin (NEURONTIN) 300 MG capsule, Take 1 capsule (300 mg total) by mouth 2 (two) times daily., Disp: 60 capsule, Rfl: 0 .  omeprazole (PRILOSEC) 20 MG capsule, Take 20 mg by mouth daily as needed., Disp: , Rfl: 0 .  PROAIR HFA 108 (90 Base) MCG/ACT inhaler, Inhale 2 puffs into the lungs every 4 (four) hours as needed for wheezing or shortness of breath., Disp: 8.5 g, Rfl: 0 .  PULMICORT FLEXHALER 90 MCG/ACT inhaler, Inhale 2 puffs into the lungs 2 (two) times daily., Disp: 3 Inhaler, Rfl: 3 .  ranitidine (ZANTAC) 300 MG tablet, Take 1 tablet (300 mg total) by mouth at bedtime., Disp: 90 tablet, Rfl: 3 .  valACYclovir (VALTREX) 1000 MG tablet, Two pills by mouth at first sign of outbreak followed by two more pills twelve hours later, Disp: 4 tablet, Rfl: 2   Allergies:  Codeine and Naproxen  Review of Systems: Gen:  Denies  fever, sweats, chills HEENT: Denies blurred vision, double vision. bleeds, sore throat Cvc:  No dizziness, chest pain. Resp:   Denies cough or sputum production, shortness of breath Gi: Denies swallowing difficulty, stomach pain. Gu:  Denies bladder incontinence, burning urine Ext:   No Joint pain, stiffness. Skin: No skin rash,   hives  Endoc:  No polyuria, polydipsia. Psych: No depression, insomnia. Other:  All other systems were reviewed with the patient and were negative other that what is mentioned in the HPI.   Physical Examination:   VS: BP 116/70 (BP Location: Left Arm, Cuff Size: Normal)   Pulse (!) 104   Ht 5\' 5"  (1.651 m)   Wt (!) 332 lb (150.6 kg)   SpO2 95%   BMI 55.25 kg/m   General Appearance: No distress  Neuro:without focal findings,  speech normal,  HEENT: PERRLA, EOM intact.   Pulmonary: normal breath sounds, No wheezing.  CardiovascularNormal S1,S2.  No m/r/g.   Abdomen: Benign, Soft,  non-tender. Renal:  No costovertebral tenderness  GU:  No performed at this time. Endoc: No evident thyromegaly, no signs of acromegaly. Skin:   warm, no rashes, no ecchymosis  Extremities: normal, no cyanosis, clubbing.  Other findings:    LABORATORY PANEL:   CBC No results for input(s): WBC, HGB, HCT, PLT in the last 168 hours. ------------------------------------------------------------------------------------------------------------------  Chemistries  No results for input(s): NA, K, CL, CO2, GLUCOSE, BUN, CREATININE, CALCIUM, MG, AST, ALT, ALKPHOS, BILITOT in the last 168 hours.  Invalid input(s): GFRCGP ------------------------------------------------------------------------------------------------------------------  Cardiac Enzymes No results for input(s): TROPONINI in the last 168 hours. ------------------------------------------------------------  RADIOLOGY:  No results found.     Thank  you for the consultation and for allowing [facility] to assist in the care of your patient. Our recommendations are noted above.  Please contact us if we can be of further service.   Marda Stalker, MD.  Board Certified in Internal Medicine, Pulmonary Medicine, [place], and Sleep Medicine.  [facility] Office Number: [phone]  Patricia Pesa, M.D.  Merton Border, M.D  10/02/2016

## 2016-10-03 ENCOUNTER — Encounter: Payer: Self-pay | Admitting: Internal Medicine

## 2016-10-03 ENCOUNTER — Ambulatory Visit (INDEPENDENT_AMBULATORY_CARE_PROVIDER_SITE_OTHER): Payer: BLUE CROSS/BLUE SHIELD | Admitting: Internal Medicine

## 2016-10-03 VITALS — BP 116/70 | HR 104 | Ht 65.0 in | Wt 332.0 lb

## 2016-10-03 DIAGNOSIS — J4541 Moderate persistent asthma with (acute) exacerbation: Secondary | ICD-10-CM

## 2016-10-03 NOTE — Patient Instructions (Addendum)
--  Start nasacort over the counter. 2 sprays in each nostril at night time.   --Take an antihistamine every day.   --Start prevacid 15 mg daily.   --Remove pets from the bedroom.

## 2016-10-20 DIAGNOSIS — T7422XA Child sexual abuse, confirmed, initial encounter: Secondary | ICD-10-CM | POA: Diagnosis not present

## 2016-11-06 DIAGNOSIS — Z6841 Body Mass Index (BMI) 40.0 and over, adult: Secondary | ICD-10-CM | POA: Diagnosis not present

## 2016-11-06 DIAGNOSIS — M25561 Pain in right knee: Secondary | ICD-10-CM | POA: Diagnosis not present

## 2016-11-06 DIAGNOSIS — M25511 Pain in right shoulder: Secondary | ICD-10-CM | POA: Diagnosis not present

## 2016-11-14 DIAGNOSIS — Z6841 Body Mass Index (BMI) 40.0 and over, adult: Secondary | ICD-10-CM | POA: Diagnosis not present

## 2016-11-14 DIAGNOSIS — M25561 Pain in right knee: Secondary | ICD-10-CM | POA: Diagnosis not present

## 2016-11-15 DIAGNOSIS — S43003A Unspecified subluxation of unspecified shoulder joint, initial encounter: Secondary | ICD-10-CM | POA: Insufficient documentation

## 2016-11-15 DIAGNOSIS — S8001XA Contusion of right knee, initial encounter: Secondary | ICD-10-CM | POA: Insufficient documentation

## 2016-12-26 DIAGNOSIS — G4733 Obstructive sleep apnea (adult) (pediatric): Secondary | ICD-10-CM | POA: Diagnosis not present

## 2017-01-15 ENCOUNTER — Ambulatory Visit (INDEPENDENT_AMBULATORY_CARE_PROVIDER_SITE_OTHER): Payer: BLUE CROSS/BLUE SHIELD | Admitting: Family Medicine

## 2017-01-15 ENCOUNTER — Encounter: Payer: Self-pay | Admitting: Family Medicine

## 2017-01-15 DIAGNOSIS — Z2821 Immunization not carried out because of patient refusal: Secondary | ICD-10-CM | POA: Diagnosis not present

## 2017-01-15 DIAGNOSIS — Z0000 Encounter for general adult medical examination without abnormal findings: Secondary | ICD-10-CM | POA: Diagnosis not present

## 2017-01-15 MED ORDER — VALACYCLOVIR HCL 1 G PO TABS: ORAL_TABLET | ORAL | 2 refills | Status: DC

## 2017-01-15 NOTE — Assessment & Plan Note (Signed)
Patient declined flu vaccine today; encouraged her to reconsider, return or get one elsewhere when done with cold

## 2017-01-15 NOTE — Assessment & Plan Note (Signed)
Offered assistance, offered referral to bariatric clinic; patient politely declined

## 2017-01-15 NOTE — Patient Instructions (Addendum)
Please to try to supplement with 800 to 1000 iu of vitamin D3 over the winter on days when you don't get much sunshine Talk with neurologist about the gabapentin Health Maintenance, Female Adopting a healthy lifestyle and getting preventive care can go a long way to promote health and wellness. Talk with your health care provider about what schedule of regular examinations is right for you. This is a good chance for you to check in with your provider about disease prevention and staying healthy. In between checkups, there are plenty of things you can do on your own. Experts have done a lot of research about which lifestyle changes and preventive measures are most likely to keep you healthy. Ask your health care provider for more information. Weight and diet Eat a healthy diet  Be sure to include plenty of vegetables, fruits, low-fat dairy products, and lean protein.  Do not eat a lot of foods high in solid fats, added sugars, or salt.  Get regular exercise. This is one of the most important things you can do for your health. ? Most adults should exercise for at least 150 minutes each week. The exercise should increase your heart rate and make you sweat (moderate-intensity exercise). ? Most adults should also do strengthening exercises at least twice a week. This is in addition to the moderate-intensity exercise.  Maintain a healthy weight  Body mass index (BMI) is a measurement that can be used to identify possible weight problems. It estimates body fat based on height and weight. Your health care provider can help determine your BMI and help you achieve or maintain a healthy weight.  For females 42 years of age and older: ? A BMI below 18.5 is considered underweight. ? A BMI of 18.5 to 24.9 is normal. ? A BMI of 25 to 29.9 is considered overweight. ? A BMI of 30 and above is considered obese.  Watch levels of cholesterol and blood lipids  You should start having your blood tested for  lipids and cholesterol at 44 years of age, then have this test every 5 years.  You may need to have your cholesterol levels checked more often if: ? Your lipid or cholesterol levels are high. ? You are older than 44 years of age. ? You are at high risk for heart disease.  Cancer screening Lung Cancer  Lung cancer screening is recommended for adults 15-8 years old who are at high risk for lung cancer because of a history of smoking.  A yearly low-dose CT scan of the lungs is recommended for people who: ? Currently smoke. ? Have quit within the past 15 years. ? Have at least a 30-pack-year history of smoking. A pack year is smoking an average of one pack of cigarettes a day for 1 year.  Yearly screening should continue until it has been 15 years since you quit.  Yearly screening should stop if you develop a health problem that would prevent you from having lung cancer treatment.  Breast Cancer  Practice breast self-awareness. This means understanding how your breasts normally appear and feel.  It also means doing regular breast self-exams. Let your health care provider know about any changes, no matter how small.  If you are in your 20s or 30s, you should have a clinical breast exam (CBE) by a health care provider every 1-3 years as part of a regular health exam.  If you are 54 or older, have a CBE every year. Also consider having a breast  X-ray (mammogram) every year.  If you have a family history of breast cancer, talk to your health care provider about genetic screening.  If you are at high risk for breast cancer, talk to your health care provider about having an MRI and a mammogram every year.  Breast cancer gene (BRCA) assessment is recommended for women who have family members with BRCA-related cancers. BRCA-related cancers include: ? Breast. ? Ovarian. ? Tubal. ? Peritoneal cancers.  Results of the assessment will determine the need for genetic counseling and BRCA1 and  BRCA2 testing.  Cervical Cancer Your health care provider may recommend that you be screened regularly for cancer of the pelvic organs (ovaries, uterus, and vagina). This screening involves a pelvic examination, including checking for microscopic changes to the surface of your cervix (Pap test). You may be encouraged to have this screening done every 3 years, beginning at age 84.  For women ages 24-65, health care providers may recommend pelvic exams and Pap testing every 3 years, or they may recommend the Pap and pelvic exam, combined with testing for human papilloma virus (HPV), every 5 years. Some types of HPV increase your risk of cervical cancer. Testing for HPV may also be done on women of any age with unclear Pap test results.  Other health care providers may not recommend any screening for nonpregnant women who are considered low risk for pelvic cancer and who do not have symptoms. Ask your health care provider if a screening pelvic exam is right for you.  If you have had past treatment for cervical cancer or a condition that could lead to cancer, you need Pap tests and screening for cancer for at least 20 years after your treatment. If Pap tests have been discontinued, your risk factors (such as having a new sexual partner) need to be reassessed to determine if screening should resume. Some women have medical problems that increase the chance of getting cervical cancer. In these cases, your health care provider may recommend more frequent screening and Pap tests.  Colorectal Cancer  This type of cancer can be detected and often prevented.  Routine colorectal cancer screening usually begins at 44 years of age and continues through 44 years of age.  Your health care provider may recommend screening at an earlier age if you have risk factors for colon cancer.  Your health care provider may also recommend using home test kits to check for hidden blood in the stool.  A small camera at the  end of a tube can be used to examine your colon directly (sigmoidoscopy or colonoscopy). This is done to check for the earliest forms of colorectal cancer.  Routine screening usually begins at age 33.  Direct examination of the colon should be repeated every 5-10 years through 44 years of age. However, you may need to be screened more often if early forms of precancerous polyps or small growths are found.  Skin Cancer  Check your skin from head to toe regularly.  Tell your health care provider about any new moles or changes in moles, especially if there is a change in a mole's shape or color.  Also tell your health care provider if you have a mole that is larger than the size of a pencil eraser.  Always use sunscreen. Apply sunscreen liberally and repeatedly throughout the day.  Protect yourself by wearing long sleeves, pants, a wide-brimmed hat, and sunglasses whenever you are outside.  Heart disease, diabetes, and high blood pressure  High blood  pressure causes heart disease and increases the risk of stroke. High blood pressure is more likely to develop in: ? People who have blood pressure in the high end of the normal range (130-139/85-89 mm Hg). ? People who are overweight or obese. ? People who are African American.  If you are 67-37 years of age, have your blood pressure checked every 3-5 years. If you are 29 years of age or older, have your blood pressure checked every year. You should have your blood pressure measured twice-once when you are at a hospital or clinic, and once when you are not at a hospital or clinic. Record the average of the two measurements. To check your blood pressure when you are not at a hospital or clinic, you can use: ? An automated blood pressure machine at a pharmacy. ? A home blood pressure monitor.  If you are between 6 years and 14 years old, ask your health care provider if you should take aspirin to prevent strokes.  Have regular diabetes  screenings. This involves taking a blood sample to check your fasting blood sugar level. ? If you are at a normal weight and have a low risk for diabetes, have this test once every three years after 44 years of age. ? If you are overweight and have a high risk for diabetes, consider being tested at a younger age or more often. Preventing infection Hepatitis B  If you have a higher risk for hepatitis B, you should be screened for this virus. You are considered at high risk for hepatitis B if: ? You were born in a country where hepatitis B is common. Ask your health care provider which countries are considered high risk. ? Your parents were born in a high-risk country, and you have not been immunized against hepatitis B (hepatitis B vaccine). ? You have HIV or AIDS. ? You use needles to inject street drugs. ? You live with someone who has hepatitis B. ? You have had sex with someone who has hepatitis B. ? You get hemodialysis treatment. ? You take certain medicines for conditions, including cancer, organ transplantation, and autoimmune conditions.  Hepatitis C  Blood testing is recommended for: ? Everyone born from [birth year] through 1963. ? Anyone with known risk factors for hepatitis C.  Sexually transmitted infections (STIs)  You should be screened for sexually transmitted infections (STIs) including gonorrhea and chlamydia if: ? You are sexually active and are younger than 44 years of age. ? You are older than 44 years of age and your health care provider tells you that you are at risk for this type of infection. ? Your sexual activity has changed since you were last screened and you are at an increased risk for chlamydia or gonorrhea. Ask your health care provider if you are at risk.  If you do not have HIV, but are at risk, it may be recommended that you take a prescription medicine daily to prevent HIV infection. This is called pre-exposure prophylaxis (PrEP). You are considered at risk  if: ? You are sexually active and do not regularly use condoms or know the HIV status of your partner(s). ? You take drugs by injection. ? You are sexually active with a partner who has HIV.  Talk with your health care provider about whether you are at high risk of being infected with HIV. If you choose to begin PrEP, you should first be tested for HIV. You should then be tested every 3 months for as  long as you are taking PrEP. Pregnancy  If you are premenopausal and you may become pregnant, ask your health care provider about preconception counseling.  If you may become pregnant, take 400 to 800 micrograms (mcg) of folic acid every day.  If you want to prevent pregnancy, talk to your health care provider about birth control (contraception). Osteoporosis and menopause  Osteoporosis is a disease in which the bones lose minerals and strength with aging. This can result in serious bone fractures. Your risk for osteoporosis can be identified using a bone density scan.  If you are 94 years of age or older, or if you are at risk for osteoporosis and fractures, ask your health care provider if you should be screened.  Ask your health care provider whether you should take a calcium or vitamin D supplement to lower your risk for osteoporosis.  Menopause may have certain physical symptoms and risks.  Hormone replacement therapy may reduce some of these symptoms and risks. Talk to your health care provider about whether hormone replacement therapy is right for you. Follow these instructions at home:  Schedule regular health, dental, and eye exams.  Stay current with your immunizations.  Do not use any tobacco products including cigarettes, chewing tobacco, or electronic cigarettes.  If you are pregnant, do not drink alcohol.  If you are breastfeeding, limit how much and how often you drink alcohol.  Limit alcohol intake to no more than 1 drink per day for nonpregnant women. One drink  equals 12 ounces of beer, 5 ounces of wine, or 1 ounces of hard liquor.  Do not use street drugs.  Do not share needles.  Ask your health care provider for help if you need support or information about quitting drugs.  Tell your health care provider if you often feel depressed.  Tell your health care provider if you have ever been abused or do not feel safe at home. This information is not intended to replace advice given to you by your health care provider. Make sure you discuss any questions you have with your health care provider. Document Released: 10/03/2010 Document Revised: 08/26/2015 Document Reviewed: 12/22/2014 Elsevier Interactive Patient Education  [employer].

## 2017-01-15 NOTE — Assessment & Plan Note (Signed)
USPSTF grade A and B recommendations reviewed with patient; age-appropriate recommendations, preventive care, screening tests, etc discussed and encouraged; healthy living encouraged; see AVS for patient education given to patient  

## 2017-01-15 NOTE — Progress Notes (Signed)
Patient ID: Jamie Hardin, female   DOB: [birth year], 46 y.o.   MRN: [id number]   Subjective:   Jamie Hardin is a 46 y.o. female here for a complete physical exam  Interim issues since last visit: getting clonazepam from [place] [facility]; using rarely; seeing her once a year Has fibromyalgia, diagnosed years ago; had nerve testing; seeing neurologist  USPSTF grade A and B recommendations Depression:  Depression screen [facility] 2/9 01/15/2017 09/15/2016 03/21/2016 01/14/2016 01/12/2015  Decreased Interest 0 0 0 0 0  Down, Depressed, Hopeless 0 0 0 0 0  PHQ - 2 Score 0 0 0 0 0   Hypertension: BP Readings from Last 3 Encounters:  01/15/17 126/72  10/03/16 116/70  09/15/16 126/74   Obesity: offered referral, politely declined Wt Readings from Last 3 Encounters:  01/15/17 (!) 332 lb (150.6 kg)  10/03/16 (!) 332 lb (150.6 kg)  09/15/16 (!) 332 lb 4.8 oz (150.7 kg)   BMI Readings from Last 3 Encounters:  01/15/17 54.62 kg/m  10/03/16 55.25 kg/m  09/15/16 55.30 kg/m    Alcohol: rarely Tobacco use: former smoker, quit 13.5 years ago HIV, hep B, hep C: not interested STD testing and prevention (chl/gon/syphilis): not interested Intimate partner violence: no abuse Breast cancer: no lumps BRCA gene screening: not known Cervical cancer screening: nada Osteoporosis: n/a Fall prevention/vitamin D: outdoors some, office window; 800 to 1000 over winter Lipids:  Lab Results  Component Value Date   CHOL 152 01/14/2016   CHOL 130 01/12/2015   Lab Results  Component Value Date   HDL 51 01/14/2016   HDL 48 01/12/2015   Lab Results  Component Value Date   LDLCALC 81 01/14/2016   LDLCALC 65 01/12/2015   Lab Results  Component Value Date   TRIG 101 01/14/2016   TRIG 86 01/12/2015   Lab Results  Component Value Date   CHOLHDL 3.0 01/14/2016   No results found for: LDLDIRECT Glucose:  Glucose  Date Value Ref Range Status  01/12/2015 101 (H) 65 - 99 mg/dL Final   04/13/2012 95 65 - 99 mg/dL Final   Glucose, Bld  Date Value Ref Range Status  01/14/2016 102 (H) 65 - 99 mg/dL Final   Colorectal cancer: no fam hx; start at 28 for one group, start at 29 for other three groups Lung cancer:  n/a AAA: n/a Aspirin: n/a Diet: plenty of calcium, not enough fruits and veggies, some fiber Exercise: no; got injured two months ago (knee); seeing ortho Skin cancer: nothing worrisome  Past Medical History:  Diagnosis Date  . Allergy   . Asthma   . Chronic sinusitis   . Dysthymic disorder   . GERD (gastroesophageal reflux disease)   . Obesity   . Ovarian endometriosis   . Peripheral neuropathy   . 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 Torn meniscus    Past Surgical History:  Procedure Laterality Date  . ABDOMINAL HYSTERECTOMY  2007   complete due to endometriosis  . NASAL SINUS SURGERY  1996 and 1999   x 2   Family History  Problem Relation Age of Onset  . Fibromyalgia Mother   . Diabetes Mother   . Cancer Father        prostate  . Diabetes Maternal Aunt   . Diabetes Paternal Aunt   . Scleroderma Maternal Grandmother   . Heart disease Maternal Grandfather   . Hypertension Neg Hx   . Hyperlipidemia Neg Hx   .  Stroke Neg Hx   . COPD Neg Hx    Social History  Substance Use Topics  . Smoking status: Former Smoker    Packs/day: 1.00    Years: 20.00    Types: Cigarettes    Quit date: 04/04/2003  . Smokeless tobacco: Never Used  . Alcohol use Yes     Comment: Very Rare   Review of Systems  Objective:   Vitals:   01/15/17 0839  BP: 126/72  Pulse: 91  Resp: 14  Temp: 98 F (36.7 C)  TempSrc: Oral  SpO2: 97%  Weight: (!) 332 lb (150.6 kg)  Height: 5' 5.38" (1.661 m)   Body mass index is 54.62 kg/m. Wt Readings from Last 3 Encounters:  01/15/17 (!) 332 lb (150.6 kg)  10/03/16 (!) 332 lb (150.6 kg)  09/15/16 (!) 332 lb 4.8 oz (150.7 kg)   Physical Exam  Constitutional: She appears well-developed and  well-nourished.  Morbidly obese  HENT:  Head: Normocephalic and atraumatic.  Right Ear: Hearing, tympanic membrane, external ear and ear canal normal.  Left Ear: Hearing, tympanic membrane, external ear and ear canal normal.  Eyes: Conjunctivae and EOM are normal. Right eye exhibits no hordeolum. Left eye exhibits no hordeolum. No scleral icterus.  Neck: Carotid bruit is not present. No thyromegaly present.  Cardiovascular: Normal rate, regular rhythm, S1 normal, S2 normal and normal heart sounds.   No extrasystoles are present.  Pulmonary/Chest: Effort normal and breath sounds normal. No respiratory distress. Right breast exhibits no inverted nipple, no mass, no nipple discharge, no skin change and no tenderness. Left breast exhibits no inverted nipple, no mass, no nipple discharge, no skin change and no tenderness. Breasts are symmetrical.  Abdominal: Soft. Normal appearance and bowel sounds are normal. She exhibits no distension, no abdominal bruit, no pulsatile midline mass and no mass. There is no hepatosplenomegaly. There is no tenderness. No hernia.  Musculoskeletal: Normal range of motion. She exhibits no edema.  Lymphadenopathy:       Head (right side): No submandibular adenopathy present.       Head (left side): No submandibular adenopathy present.    She has no cervical adenopathy.    She has no axillary adenopathy.  Neurological: She is alert. She displays no tremor. No cranial nerve deficit. She exhibits normal muscle tone. Gait normal.  Reflex Scores:      Patellar reflexes are 1+ on the right side. Did not test right patellar reflex due to knee injury  Skin: Skin is warm and dry. No bruising and no ecchymosis noted. No cyanosis. No pallor.  Psychiatric: Her speech is normal and behavior is normal. Thought content normal. Her mood appears not anxious. She does not exhibit a depressed mood.    Assessment/Plan:   Problem List Items Addressed This Visit      Other    Preventative health care    USPSTF grade A and B recommendations reviewed with patient; age-appropriate recommendations, preventive care, screening tests, etc discussed and encouraged; healthy living encouraged; see AVS for patient education given to patient       Relevant Orders   CBC with Differential/Platelet   COMPLETE METABOLIC PANEL WITH GFR   Lipid panel   TSH   Morbid obesity ([place])    Offered assistance, offered referral to bariatric clinic; patient politely declined      Influenza vaccination declined by patient    Patient declined flu vaccine today; encouraged her to reconsider, return or get one elsewhere when done with  cold          Meds ordered this encounter  Medications  . diclofenac (VOLTAREN) 75 MG EC tablet    Sig: Take 75 mg by mouth daily.    Refill:  0  . valACYclovir (VALTREX) 1000 MG tablet    Sig: Two pills by mouth at first sign of outbreak followed by two more pills twelve hours later    Dispense:  4 tablet    Refill:  2    Stop acyclovir   Orders Placed This Encounter  Procedures  . CBC with Differential/Platelet  . COMPLETE METABOLIC PANEL WITH GFR  . Lipid panel  . TSH    Follow up plan: Return in about 1 year (around 01/15/2018) for complete physical.  An After Visit Summary was printed and given to the patient.

## 2017-01-18 LAB — CBC WITH DIFFERENTIAL/PLATELET
BASOS ABS: 32 {cells}/uL (ref 0–200)
BASOS PCT: 0.5 %
EOS ABS: 141 {cells}/uL (ref 15–500)
Eosinophils Relative: 2.2 %
HEMATOCRIT: 42.8 % (ref 35.0–45.0)
HEMOGLOBIN: 14 g/dL (ref 11.7–15.5)
Lymphs Abs: 1376 cells/uL (ref 850–3900)
MCH: 27.6 pg (ref 27.0–33.0)
MCHC: 32.7 g/dL (ref 32.0–36.0)
MCV: 84.3 fL (ref 80.0–100.0)
MPV: 10.7 fL (ref 7.5–12.5)
Monocytes Relative: 6.4 %
NEUTROS ABS: 4442 {cells}/uL (ref 1500–7800)
Neutrophils Relative %: 69.4 %
Platelets: 246 10*3/uL (ref 140–400)
RBC: 5.08 10*6/uL (ref 3.80–5.10)
RDW: 12.8 % (ref 11.0–15.0)
Total Lymphocyte: 21.5 %
WBC mixed population: 410 cells/uL (ref 200–950)
WBC: 6.4 10*3/uL (ref 3.8–10.8)

## 2017-01-18 LAB — COMPLETE METABOLIC PANEL WITH GFR
AG RATIO: 1.4 (calc) (ref 1.0–2.5)
ALBUMIN MSPROF: 4 g/dL (ref 3.6–5.1)
ALT: 19 U/L (ref 6–29)
AST: 14 U/L (ref 10–30)
Alkaline phosphatase (APISO): 83 U/L (ref 33–115)
BILIRUBIN TOTAL: 0.3 mg/dL (ref 0.2–1.2)
BUN: 13 mg/dL (ref 7–25)
CALCIUM: 9.2 mg/dL (ref 8.6–10.2)
CHLORIDE: 105 mmol/L (ref 98–110)
CO2: 28 mmol/L (ref 20–32)
Creat: 0.74 mg/dL (ref 0.50–1.10)
GFR, EST AFRICAN AMERICAN: 114 mL/min/{1.73_m2} (ref >=60)
GFR, EST NON AFRICAN AMERICAN: 99 mL/min/{1.73_m2} (ref >=60)
GLOBULIN: 2.9 g/dL (ref 1.9–3.7)
Glucose, Bld: 99 mg/dL (ref 65–99)
POTASSIUM: 4.1 mmol/L (ref 3.5–5.3)
SODIUM: 141 mmol/L (ref 135–146)
TOTAL PROTEIN: 6.9 g/dL (ref 6.1–8.1)

## 2017-01-18 LAB — LIPID PANEL
Cholesterol: 175 mg/dL (ref <200)
HDL: 64 mg/dL (ref >50)
LDL Cholesterol (Calc): 92 mg/dL (calc)
NON-HDL CHOLESTEROL (CALC): 111 mg/dL (ref <130)
TRIGLYCERIDES: 91 mg/dL (ref <150)
Total CHOL/HDL Ratio: 2.7 (calc) (ref <5.0)

## 2017-01-18 LAB — TSH: TSH: 2.84 m[IU]/L

## 2017-02-15 DIAGNOSIS — Z1231 Encounter for screening mammogram for malignant neoplasm of breast: Secondary | ICD-10-CM | POA: Diagnosis not present

## 2017-02-15 DIAGNOSIS — N6310 Unspecified lump in the right breast, unspecified quadrant: Secondary | ICD-10-CM | POA: Diagnosis not present

## 2017-02-15 LAB — HM MAMMOGRAPHY

## 2017-03-28 DIAGNOSIS — G4733 Obstructive sleep apnea (adult) (pediatric): Secondary | ICD-10-CM | POA: Diagnosis not present

## 2017-04-15 ENCOUNTER — Other Ambulatory Visit: Payer: Self-pay | Admitting: Family Medicine

## 2017-04-24 DIAGNOSIS — J45901 Unspecified asthma with (acute) exacerbation: Secondary | ICD-10-CM | POA: Diagnosis not present

## 2017-04-24 DIAGNOSIS — J019 Acute sinusitis, unspecified: Secondary | ICD-10-CM | POA: Diagnosis not present

## 2017-04-30 DIAGNOSIS — R1011 Right upper quadrant pain: Secondary | ICD-10-CM | POA: Diagnosis not present

## 2017-04-30 DIAGNOSIS — R110 Nausea: Secondary | ICD-10-CM | POA: Diagnosis not present

## 2017-04-30 DIAGNOSIS — Z79899 Other long term (current) drug therapy: Secondary | ICD-10-CM | POA: Diagnosis not present

## 2017-04-30 DIAGNOSIS — Z87891 Personal history of nicotine dependence: Secondary | ICD-10-CM | POA: Diagnosis not present

## 2017-04-30 DIAGNOSIS — R109 Unspecified abdominal pain: Secondary | ICD-10-CM | POA: Diagnosis not present

## 2017-04-30 DIAGNOSIS — J45909 Unspecified asthma, uncomplicated: Secondary | ICD-10-CM | POA: Diagnosis not present

## 2017-05-02 DIAGNOSIS — Z886 Allergy status to analgesic agent status: Secondary | ICD-10-CM | POA: Diagnosis not present

## 2017-05-02 DIAGNOSIS — K5900 Constipation, unspecified: Secondary | ICD-10-CM | POA: Diagnosis not present

## 2017-05-02 DIAGNOSIS — Z885 Allergy status to narcotic agent status: Secondary | ICD-10-CM | POA: Diagnosis not present

## 2017-05-02 DIAGNOSIS — J45909 Unspecified asthma, uncomplicated: Secondary | ICD-10-CM | POA: Diagnosis not present

## 2017-05-02 DIAGNOSIS — Z79899 Other long term (current) drug therapy: Secondary | ICD-10-CM | POA: Diagnosis not present

## 2017-05-02 DIAGNOSIS — R1011 Right upper quadrant pain: Secondary | ICD-10-CM | POA: Diagnosis not present

## 2017-05-02 DIAGNOSIS — Z87891 Personal history of nicotine dependence: Secondary | ICD-10-CM | POA: Diagnosis not present

## 2017-05-02 DIAGNOSIS — R110 Nausea: Secondary | ICD-10-CM | POA: Diagnosis not present

## 2017-05-04 ENCOUNTER — Encounter: Payer: Self-pay | Admitting: Family Medicine

## 2017-05-04 ENCOUNTER — Telehealth: Payer: Self-pay | Admitting: Family Medicine

## 2017-05-04 DIAGNOSIS — Q433 Congenital malformations of intestinal fixation: Secondary | ICD-10-CM

## 2017-05-04 HISTORY — DX: Congenital malformations of intestinal fixation: Q43.3

## 2017-05-04 NOTE — Assessment & Plan Note (Signed)
Refer back to surgeon 

## 2017-05-04 NOTE — Telephone Encounter (Signed)
Pt notified referred to [facility]

## 2017-05-04 NOTE — Telephone Encounter (Signed)
I reviewed the ultrasound report from 04/13/2012; the liver is on the right and the spleen is on the left which is normal, not situs inversus I reviewed the RUQ ultrasound dated 04/30/2017 in [place]; no mention of situs inversus; just fatty liver The CT scan on 04/30/2017 showed possible partial malrotation, with cecum in the left lower quadrant, and duodenum crosses the ligament of Treitz, but no acute volvulus  I see that she has been to the ER, but don't see any notes about [facility]  I reviewed surgery note from there were plans to have her return for possible elective surgery I think she needs to go back to see a surgeon, not a GI doctor Ask if she wants to see [facility] surgery or [facility] surgery, then enter referral, diagnosis is actually malrotation of the cecum Thank you

## 2017-05-04 NOTE — Telephone Encounter (Signed)
CRM for notification. See Telephone encounter for:   05/04/17.  Relation to pt: self Call back number: [phone]   Reason for call:  Patient requesting gastro referral to [facility] due to "situs inversus" . Patient states currently [facility] "is not doing anything" , please advise

## 2017-05-11 NOTE — Addendum Note (Signed)
Addended by: LADA, Satira Anis on: 05/11/2017 09:32 AM   Modules accepted: Orders

## 2017-05-11 NOTE — Telephone Encounter (Signed)
Okay, I entered the referral to [facility] as she requested Please let her know

## 2017-05-11 NOTE — Telephone Encounter (Signed)
Pt called to follow up on referral.  Pt states she does not think a the general surgeon is who she needs to see. Pt wanted an appt today to discuss with Dr Jamie Hardin, but no appointments. Pt declined to make an appointment at all for next week.   Pt asking for a call back today to explain this to Dr Jamie Hardin.

## 2017-05-15 ENCOUNTER — Telehealth: Payer: Self-pay | Admitting: Family Medicine

## 2017-05-15 NOTE — Telephone Encounter (Signed)
Left pt detailed voicemail to call us back if this was ok to enter referral to them

## 2017-05-15 NOTE — Telephone Encounter (Signed)
I entered the referral to GI as she requested and received this note back from the GI provider (see below); please let her know their recommendation and refer to provider listed in the note please:  RE: Referral on 05/07/2017   Thank you for this consult. I agree that she needs to see a Psychologist, sport and exercise. There is nothing that GI can do to help with her situation.  I would have her see one of the Colorectal surgeons at [facility].  [facility]: Drs. Aurelio Jew, or [facility]: Dr. Earley Brooke: Dr. Glennon Mac or Ebony Hail.  Please let me know how I can facilitate. All my best, Jamesetta So

## 2017-06-12 DIAGNOSIS — Z6841 Body Mass Index (BMI) 40.0 and over, adult: Secondary | ICD-10-CM | POA: Diagnosis not present

## 2017-06-12 DIAGNOSIS — R1011 Right upper quadrant pain: Secondary | ICD-10-CM | POA: Diagnosis not present

## 2017-06-12 DIAGNOSIS — R112 Nausea with vomiting, unspecified: Secondary | ICD-10-CM | POA: Diagnosis not present

## 2017-06-12 DIAGNOSIS — Z90710 Acquired absence of both cervix and uterus: Secondary | ICD-10-CM | POA: Diagnosis not present

## 2017-06-12 DIAGNOSIS — Z87891 Personal history of nicotine dependence: Secondary | ICD-10-CM | POA: Diagnosis not present

## 2017-06-12 DIAGNOSIS — M797 Fibromyalgia: Secondary | ICD-10-CM | POA: Diagnosis not present

## 2017-06-12 DIAGNOSIS — Q433 Congenital malformations of intestinal fixation: Secondary | ICD-10-CM | POA: Diagnosis not present

## 2017-06-18 DIAGNOSIS — R112 Nausea with vomiting, unspecified: Secondary | ICD-10-CM | POA: Diagnosis not present

## 2017-06-18 DIAGNOSIS — R1011 Right upper quadrant pain: Secondary | ICD-10-CM | POA: Diagnosis not present

## 2017-06-27 DIAGNOSIS — J019 Acute sinusitis, unspecified: Secondary | ICD-10-CM | POA: Diagnosis not present

## 2017-06-28 DIAGNOSIS — Z7689 Persons encountering health services in other specified circumstances: Secondary | ICD-10-CM | POA: Diagnosis not present

## 2017-06-28 DIAGNOSIS — Q433 Congenital malformations of intestinal fixation: Secondary | ICD-10-CM | POA: Insufficient documentation

## 2017-07-05 DIAGNOSIS — J45901 Unspecified asthma with (acute) exacerbation: Secondary | ICD-10-CM | POA: Diagnosis not present

## 2017-07-05 DIAGNOSIS — J019 Acute sinusitis, unspecified: Secondary | ICD-10-CM | POA: Diagnosis not present

## 2017-07-11 ENCOUNTER — Encounter: Payer: Self-pay | Admitting: Family Medicine

## 2017-07-11 ENCOUNTER — Ambulatory Visit
Admission: RE | Admit: 2017-07-11 | Discharge: 2017-07-11 | Disposition: A | Discharge status: Home / Self Care | Payer: BLUE CROSS/BLUE SHIELD | Source: Ambulatory Visit | Attending: Family Medicine | Admitting: Family Medicine

## 2017-07-11 ENCOUNTER — Ambulatory Visit: Payer: BLUE CROSS/BLUE SHIELD | Admitting: Family Medicine

## 2017-07-11 ENCOUNTER — Other Ambulatory Visit: Payer: Self-pay

## 2017-07-11 VITALS — BP 122/74 | HR 116 | Temp 98.1°F | Ht 65.0 in | Wt 335.9 lb

## 2017-07-11 DIAGNOSIS — R05 Cough: Secondary | ICD-10-CM | POA: Insufficient documentation

## 2017-07-11 DIAGNOSIS — R059 Cough, unspecified: Secondary | ICD-10-CM

## 2017-07-11 DIAGNOSIS — M722 Plantar fascial fibromatosis: Secondary | ICD-10-CM | POA: Insufficient documentation

## 2017-07-11 DIAGNOSIS — M5030 Other cervical disc degeneration, unspecified cervical region: Secondary | ICD-10-CM | POA: Insufficient documentation

## 2017-07-11 DIAGNOSIS — M545 Low back pain, unspecified: Secondary | ICD-10-CM | POA: Insufficient documentation

## 2017-07-11 DIAGNOSIS — Z87891 Personal history of nicotine dependence: Secondary | ICD-10-CM | POA: Diagnosis not present

## 2017-07-11 DIAGNOSIS — J4541 Moderate persistent asthma with (acute) exacerbation: Secondary | ICD-10-CM

## 2017-07-11 DIAGNOSIS — R0602 Shortness of breath: Secondary | ICD-10-CM | POA: Diagnosis not present

## 2017-07-11 DIAGNOSIS — M25579 Pain in unspecified ankle and joints of unspecified foot: Secondary | ICD-10-CM | POA: Insufficient documentation

## 2017-07-11 MED ORDER — MONTELUKAST SODIUM 10 MG PO TABS: 10.0000 mg | ORAL_TABLET | Freq: Every day | ORAL | 11 refills | Status: DC

## 2017-07-11 NOTE — Patient Instructions (Addendum)
Check out the information at familydoctor.org entitled "Nutrition for Weight Loss: What You Need to Know about Fad Diets" Try to lose between 1-2 pounds per week by taking in fewer calories and burning off more calories You can succeed by limiting portions, limiting foods dense in calories and fat, becoming more active, and drinking 8 glasses of water a day (64 ounces) Don't skip meals, especially breakfast, as skipping meals may alter your metabolism Do not use over-the-counter weight loss pills or gimmicks that claim rapid weight loss A healthy BMI (or body mass index) is between 18.5 and 24.9 You can calculate your ideal BMI at the [place] website ClubMonetize.fr  Please have the chest xray done across the street when you leave here Add singulair (montelukast) daily We'll have you see the pulmonologist  Cut back to 2500 calories a day Start walking as outlined in our plan Return in one month

## 2017-07-11 NOTE — Progress Notes (Signed)
BP 122/74 (BP Location: Right Arm, Patient Position: Sitting, Cuff Size: Large)   Pulse (!) 116   Temp 98.1 F (36.7 C) (Oral)   Ht 5\' 5"  (1.651 m)   Wt (!) 335 lb 14.4 oz (152.4 kg)   SpO2 96%   BMI 55.90 kg/m    Subjective:    Patient ID: Jamie Hardin, female    DOB: [birth year], 47 y.o.   MRN: [id number]  HPI: Jamie Hardin is a 47 y.o. female  Chief Complaint  Patient presents with  . Weight Loss Surgery    HPI Patient is here for an evaluation prior to weight loss surgery She was referred to the bariatric surgeon She is going to start the  We have discussed the weight problem  See the paperwork for a guide No idea about caloric intake  Not able to really walk right now; just had knee surgery  She is on prednisone; she has been sick for a month; did [facility] Live through insurance, facetime; had visit already for this; cough; two visits for this, extended antibiotics, two rounds of antibiotics; started a month ago, maybe 3.5 weeks ago; started with a really bad cold; not the flu; she did not run a fever, but got deathly ill and had aches; her daughter was sick too but she is better; the cough has continued; sinuses are not infected; right lung is the issue; nasal congestion; she gets a little milky chunk up; most dry; has an inhaler; working some; has two inhalers; rescue inhaler helps a little; never seen a lung specialist  Depression screen [facility] 2/9 07/11/2017 01/15/2017 09/15/2016 03/21/2016 01/14/2016  Decreased Interest 0 0 0 0 0  Down, Depressed, Hopeless 0 0 0 0 0  PHQ - 2 Score 0 0 0 0 0    Relevant past medical, surgical, family and social history reviewed Past Medical History:  Diagnosis Date  . Allergy   . Asthma   . Chronic sinusitis   . Dysthymic disorder   . GERD (gastroesophageal reflux disease)   . 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 Obesity   . Ovarian endometriosis   . Peripheral neuropathy   . 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 Torn meniscus    Past Surgical History:  Procedure Laterality Date  . ABDOMINAL HYSTERECTOMY  2007   complete due to endometriosis  . NASAL SINUS SURGERY  1996 and 1999   x 2   Family History  Problem Relation Age of Onset  . Fibromyalgia Mother   . Diabetes Mother   . Cancer Father        prostate  . Diabetes Maternal Aunt   . Diabetes Paternal Aunt   . Scleroderma Maternal Grandmother   . Heart disease Maternal Grandfather   . Hypertension Neg Hx   . Hyperlipidemia Neg Hx   . Stroke Neg Hx   . COPD Neg Hx    Social History   Tobacco Use  . Smoking status: Former Smoker    Packs/day: 1.00    Years: 20.00    Pack years: 20.00    Types: Cigarettes    Last attempt to quit: 04/04/2003    Years since quitting: 14.3  . Smokeless tobacco: Never Used  Substance Use Topics  . Alcohol use: Yes    Comment: Very Rare  . Drug use: No    Interim medical history since last visit reviewed. Allergies and medications reviewed  Review of Systems Per HPI unless specifically indicated above     Objective:    BP 122/74 (BP Location: Right Arm, Patient Position: Sitting, Cuff Size: Large)   Pulse (!) 116   Temp 98.1 F (36.7 C) (Oral)   Ht 5\' 5"  (1.651 m)   Wt (!) 335 lb 14.4 oz (152.4 kg)   SpO2 96%   BMI 55.90 kg/m    Physical Exam  Constitutional: She appears well-developed and well-nourished. No distress.  Morbidly obese  HENT:  Head: Normocephalic and atraumatic.  Eyes: EOM are normal. No scleral icterus.  Neck: No thyromegaly present.  Cardiovascular: Normal rate, regular rhythm and normal heart sounds.  No murmur heard. Heart rate just under 100 during auscultation  Pulmonary/Chest: Effort normal and breath sounds normal. No respiratory distress. She has no wheezes.  Abdominal: Soft. Bowel sounds are normal. She exhibits no distension.  Musculoskeletal: Normal range of motion. She exhibits no edema.  Neurological: She is alert. She exhibits  normal muscle tone.  Skin: Skin is warm and dry. She is not diaphoretic. No pallor.  Psychiatric: She has a normal mood and affect. Her behavior is normal. Judgment and thought content normal.    Results for orders placed or performed in visit on 02/15/17  HM MAMMOGRAPHY  Result Value Ref Range   HM Mammogram 0-4 Bi-Rad 0-4 Bi-Rad, Self Reported Normal      Assessment & Plan:   Problem List Items Addressed This Visit      Respiratory   Asthma   Relevant Medications   montelukast (SINGULAIR) 10 MG tablet   Other Relevant Orders   Ambulatory referral to Pulmonology     Other   Morbid obesity [facility])    Patient is planning for weight loss surgery; BMI 55.9 today; see AVS       Other Visit Diagnoses    Cough    -  Primary   Relevant Orders   DG Chest 2 View (Completed)   Ambulatory referral to Pulmonology       Follow up plan: Return in about 1 month (around 08/10/2017) for follow-up visit with Dr. Sanda Klein.  An after-visit summary was printed and given to the patient at [facility].  Please see the patient instructions which may contain other information and recommendations beyond what is mentioned above in the assessment and plan.  Meds ordered this encounter  Medications  . montelukast (SINGULAIR) 10 MG tablet    Sig: Take 1 tablet (10 mg total) by mouth at bedtime.    Dispense:  30 tablet    Refill:  11    Orders Placed This Encounter  Procedures  . DG Chest 2 View  . Ambulatory referral to Pulmonology   Face-to-face time with patient was more than 25 minutes, >50% time spent counseling and coordination of care

## 2017-07-11 NOTE — Assessment & Plan Note (Signed)
Patient is planning for weight loss surgery; BMI 55.9 today; see AVS

## 2017-07-19 NOTE — Progress Notes (Signed)
[facility] Consultation      Assessment and Plan:  The patient is a 47 year old female, referred for cough. She has a history of allergic asthma, and obstructive sleep apnea.  Asthma -Not controlled with pulmicort.  Symptoms usually increase in this time of year, spring and fall. --Will increase to high-dose advair until her allergy symptoms are resolved.  -Continue Singulair and antihistamine. -Continue pro-air inhaler as needed.  Allergic rhinitis.  -Nasal steroid.   Jerrye Bushy.  -Allergic asthma may be exacerbated by reflux. -Recommend over-the-counter H2 blocker such as Prevacid as needed.  Preoperative respiratory exam.  Patient is being planned for bariatric surgery for obesity. -Patient is cleared for surgery from pulmonary standpoint.  Obstructive sleep apnea. -Discussed importance of continued use of CPAP every night.  Date: 07/19/2017  MRN# [id number] Jamie Hardin [birth year]    Jamie Hardin is a 47 y.o. old female seen in consultation for chief complaint of:    Chief Complaint  Patient presents with  . pulmonary consult    per Dr. Sanda Klein- dx with asthma at age 70. pt reports of dry cough at times prod with green mucus, chest tightness, wheezing & sob with exertion.    HPI:  Patient comes in today as a follow-up on cough.  At last visit it was noted that she had been tried on prednisone, antibiotics, Pulmicort.  She has been diagnosed with asthma and obstructive sleep apnea.  She was noted to have 5 indoor animals which include 3 dogs and a Chinchilla, plus chickens and outdoor cats It was thought that allergic rhinitis and reflux may be a triggering, she was therefore asked to start on Nasacort and Prevacid, she was also encouraged to use CPAP nightly.  She comes back now about a year ago, her symptoms had resolved at that time, and now she returns with cough and dyspnea on mild exertion which is unusual for her. She has proair which she only has  to use in the spring in and fall. She does not thing the pulmicort helps.  She started on singulair and xyzal 1 week ago, they have with congestions in her head, but has not helped with respiratory symptoms.   Reflux is well controlled, rarely has to take medication.   She is using using CPAP "religiously" every night for the entire night.  Feels that it is benefiting her, she is no longer sleepy during the day.  Images personally reviewed. Chest x-ray 09/15/16; unremarkable, lungs. CBC 01/14/16; eosinophil count equals 100.   Social Hx:   Social History   Tobacco Use  . Smoking status: Former Smoker    Packs/day: 1.00    Years: 20.00    Pack years: 20.00    Types: Cigarettes    Last attempt to quit: 04/04/2003    Years since quitting: 14.3  . Smokeless tobacco: Never Used  Substance Use Topics  . Alcohol use: Yes    Comment: Very Rare  . Drug use: No   Medication:    Current Outpatient Medications:  .  baclofen (LIORESAL) 10 MG tablet, Take 10 mg by mouth 2 (two) times daily as needed., Disp: , Rfl:  .  clonazePAM (KLONOPIN) 0.5 MG tablet, Take 0.5 mg by mouth 2 (two) times daily as needed., Disp: , Rfl: 0 .  DM-Doxylamine-Acetaminophen (NYQUIL COLD & FLU PO), Take by mouth Nightly., Disp: , Rfl:  .  ibuprofen (ADVIL,MOTRIN) 800 MG tablet, Take 800 mg by mouth as needed. , Disp: , Rfl:  .  levocetirizine (XYZAL) 5 MG tablet, Take 1 tablet (5 mg total) by mouth daily., Disp: , Rfl:  .  montelukast (SINGULAIR) 10 MG tablet, Take 1 tablet (10 mg total) by mouth at bedtime., Disp: 30 tablet, Rfl: 11 .  omeprazole (PRILOSEC) 20 MG capsule, Take 20 mg by mouth as needed. , Disp: , Rfl:  .  ondansetron (ZOFRAN-ODT) 4 MG disintegrating tablet, DIS ONE T PO Q 8 H PRN N FOR UP TO 7 DAYS, Disp: , Rfl: 0 .  PROAIR HFA 108 (90 Base) MCG/ACT inhaler, INHALE 2 PUFFS INTO THE LUNGS EVERY 4 HOURS AS NEEDED FOR WHEEZING OR SHORTNESS OF BREATH, Disp: 8.5 g, Rfl: 0 .  promethazine (PHENERGAN) 25 MG  tablet, TAKE ONE TABLET BY MOUTH EVERY 6 HOURS AS NEEDED FOR NAUSEA FOR UP TO 7 DAYS., Disp: , Rfl: 0 .  PULMICORT FLEXHALER 90 MCG/ACT inhaler, Inhale 2 puffs into the lungs 2 (two) times daily., Disp: 3 Inhaler, Rfl: 3 .  ranitidine (ZANTAC) 300 MG tablet, Take 1 tablet (300 mg total) by mouth at bedtime. (Patient taking differently: Take 300 mg by mouth daily as needed. ), Disp: 90 tablet, Rfl: 3 .  valACYclovir (VALTREX) 1000 MG tablet, Two pills by mouth at first sign of outbreak followed by two more pills twelve hours later, Disp: 4 tablet, Rfl: 2   Allergies:  Codeine and Naproxen  Review of Systems: Gen:  Denies  fever, sweats, chills HEENT: Denies blurred vision, double vision. bleeds, sore throat Cvc:  No dizziness, chest pain. Resp:   Denies sputum production, shortness of breath Gi: Denies swallowing difficulty, stomach pain. Gu:  Denies bladder incontinence, burning urine Ext:   No Joint pain, stiffness. Skin: No skin rash,  hives  Endoc:  No polyuria, polydipsia. Psych: No depression, insomnia. Other:  All other systems were reviewed with the patient and were negative other that what is mentioned in the HPI.   Physical Examination:   VS: BP 130/72 (BP Location: Left Arm, Cuff Size: Normal)   Pulse 92   Ht 5\' 5"  (1.651 m)   Wt (!) 341 lb (154.7 kg)   SpO2 95%   BMI 56.75 kg/m   General Appearance: No distress, obese. Neuro:without focal findings,  speech normal,  HEENT: PERRLA, EOM intact.   Pulmonary: normal breath sounds, No wheezing.  CardiovascularNormal S1,S2.  No m/r/g.   Abdomen: Benign, Soft, non-tender. Renal:  No costovertebral tenderness  GU:  No performed at this time. Endoc: No evident thyromegaly, no signs of acromegaly. Skin:   warm, no rashes, no ecchymosis  Extremities: normal, no cyanosis, clubbing.  Other findings:    LABORATORY PANEL:   CBC No results for input(s): WBC, HGB, HCT, PLT in the last 168  hours. ------------------------------------------------------------------------------------------------------------------  Chemistries  No results for input(s): NA, K, CL, CO2, GLUCOSE, BUN, CREATININE, CALCIUM, MG, AST, ALT, ALKPHOS, BILITOT in the last 168 hours.  Invalid input(s): GFRCGP ------------------------------------------------------------------------------------------------------------------  Cardiac Enzymes No results for input(s): TROPONINI in the last 168 hours. ------------------------------------------------------------  RADIOLOGY:  No results found.     Thank  you for the consultation and for allowing [facility] to assist in the care of your patient. Our recommendations are noted above.  Please contact us if we can be of further service.   Marda Stalker, MD.  Board Certified in Internal Medicine, Pulmonary Medicine, [place], and Sleep Medicine.  [facility] Office Number: [phone]  Patricia Pesa, M.D.  Shanon Brow  Alva Garnet, M.D  07/19/2017

## 2017-07-20 ENCOUNTER — Encounter: Payer: Self-pay | Admitting: Internal Medicine

## 2017-07-20 ENCOUNTER — Telehealth: Payer: Self-pay | Admitting: Internal Medicine

## 2017-07-20 ENCOUNTER — Ambulatory Visit: Payer: BLUE CROSS/BLUE SHIELD | Admitting: Internal Medicine

## 2017-07-20 VITALS — BP 130/72 | HR 92 | Ht 65.0 in | Wt 341.0 lb

## 2017-07-20 DIAGNOSIS — J4541 Moderate persistent asthma with (acute) exacerbation: Secondary | ICD-10-CM | POA: Diagnosis not present

## 2017-07-20 MED ORDER — FLUTICASONE-SALMETEROL 230-21 MCG/ACT IN AERO: 2.0000 | INHALATION_SPRAY | Freq: Two times a day (BID) | RESPIRATORY_TRACT | 12 refills | Status: DC

## 2017-07-20 NOTE — Patient Instructions (Signed)
Stop pulmicort, start advair.  Call back if not better in 2-3 weeks.
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
## 2017-07-26 ENCOUNTER — Telehealth: Payer: Self-pay | Admitting: Family Medicine

## 2017-07-26 NOTE — Telephone Encounter (Signed)
Called Patient she states that you all discussed a little before at her appt last time that she has some sinus issues and just finnished antibiotic but is still having issues.  She has cough sinus pressure and drainage and jaw pain.  Please advise?

## 2017-07-26 NOTE — Telephone Encounter (Signed)
Copied from [place] [id number]. Topic: Quick Communication - See Telephone Encounter >> Jul 26, 2017  8:55 AM Ivar Drape wrote: CRM for notification. See Telephone encounter for: 07/26/17. Patient would like for Dr. Delight Ovens CMA to give her a call about her sickness.  She's is not any better.

## 2017-07-27 MED ORDER — PREDNISONE 20 MG PO TABS: 40.0000 mg | ORAL_TABLET | Freq: Every day | ORAL | 0 refills | Status: DC

## 2017-07-27 NOTE — Telephone Encounter (Signed)
I spoke with patient Took augmentin; finished it a few weeks ago Still feeling like there is congestion; still phlegm, discomfort along the jaw, inflamed Start prednisone Take with food Do not take ibuprofen or any other NSAIDs

## 2017-08-03 ENCOUNTER — Telehealth: Payer: Self-pay | Admitting: Internal Medicine

## 2017-08-03 MED ORDER — PREDNISONE 5 MG (21) PO TBPK: ORAL_TABLET | ORAL | 0 refills | Status: DC

## 2017-08-03 MED ORDER — ALBUTEROL SULFATE 0.63 MG/3ML IN NEBU: 1.0000 | INHALATION_SOLUTION | RESPIRATORY_TRACT | 3 refills | Status: DC | PRN

## 2017-08-03 NOTE — Telephone Encounter (Signed)
Pt calling back stating she is still having trouble breathing and her cough is worse.  Pt seen 07/20/17.  Instructions      Return in about 6 months (around 01/19/2018).   Stop pulmicort, start advair.  Call back if not better in 2-3 weeks.       Please advise.

## 2017-08-03 NOTE — Telephone Encounter (Signed)
Pt states she is still having trouble breathing and her coughing has gotten much worse.

## 2017-08-03 NOTE — Telephone Encounter (Signed)
Pt informed and RXs sent to pharmacy. Nothing further needed.

## 2017-08-03 NOTE — Telephone Encounter (Signed)
Can give prednisone taper. Start albuterol nebs three times daily.

## 2017-08-06 ENCOUNTER — Ambulatory Visit (INDEPENDENT_AMBULATORY_CARE_PROVIDER_SITE_OTHER): Payer: BLUE CROSS/BLUE SHIELD | Admitting: Internal Medicine

## 2017-08-06 ENCOUNTER — Encounter: Payer: Self-pay | Admitting: Internal Medicine

## 2017-08-06 VITALS — BP 108/80 | HR 96 | Resp 16 | Ht 65.0 in | Wt 338.0 lb

## 2017-08-06 DIAGNOSIS — R059 Cough, unspecified: Secondary | ICD-10-CM

## 2017-08-06 DIAGNOSIS — R05 Cough: Secondary | ICD-10-CM

## 2017-08-06 MED ORDER — BENZONATATE 100 MG PO CAPS: 200.0000 mg | ORAL_CAPSULE | Freq: Three times a day (TID) | ORAL | 2 refills | Status: DC

## 2017-08-06 MED ORDER — HYDROCODONE-ACETAMINOPHEN 7.5-325 MG/15ML PO SOLN: 10.0000 mL | Freq: Every day | ORAL | 0 refills | Status: DC

## 2017-08-06 NOTE — Patient Instructions (Addendum)
Start tessalon 2 pills three times daily.  Take mucinex DM three to four times daily.  Start hydrocodone cough syrup nightly.

## 2017-08-06 NOTE — Progress Notes (Signed)
[facility] Consultation      Assessment and Plan:  The patient is a 47 year old female, referred for cough. She has a history of allergic asthma, and obstructive sleep apnea.  Asthma with acute exacerbation, now complicated by severe intractable cough..  -Not controlled with prednisone, continue Advair, Singulair, antihistamine. --We will start Tessalon, Robitussin, narcotic cough syrup nightly. -Continue pro-air inhaler as needed.  Allergic rhinitis.  -Nasal steroid.   Jerrye Bushy.  -Allergic asthma may be exacerbated by reflux. -Recommend over-the-counter H2 blocker such as Prevacid as needed.  Preoperative respiratory exam.  Patient is being planned for bariatric surgery for obesity. -Patient is cleared for surgery from pulmonary standpoint.  Obstructive sleep apnea. -Discussed importance of continued use of CPAP every night.  Meds ordered this encounter  Medications  . benzonatate (TESSALON PERLES) 100 MG capsule    Sig: Take 2 capsules (200 mg total) by mouth 3 (three) times daily.    Dispense:  90 capsule    Refill:  2  . HYDROcodone-acetaminophen (HYCET) 7.5-325 mg/15 ml solution    Sig: Take 10 mLs by mouth at bedtime.    Dispense:  120 mL    Refill:  0     Date: 08/06/2017  MRN# [id number] Jamie Hardin 1970-04-28    Jamie Hardin is a 47 y.o. old female seen in consultation for chief complaint of: dyspnea and cough.      HPI:  Patient is a 47 year old female with a history of morbid obesity, obstructive sleep apnea, asthma, GERD.  At last visit she was being evaluated for bariatric surgery.  At that time it was noted that her asthma was not ideally controlled.  Her Pulmicort was changed to Advair.  Since that time she noticed that her breathing is declined further, she was given a prescription for prednisone taper last week with minimal improvement.  She was asked to continue Singulair and Xyzal. She is currently completing a round of  prednisone and is not currently on abx.  She has continued severe cough which is worse at night when laying down. She is doing nebs tid which do not help. She does not think the prednisone has helped much.  She takes nothing for cough currently.  She has 5 indoor animals which include 3 dogs and a Chinchilla, plus chickens and outdoor cats It was thought that allergic rhinitis and reflux may be a triggering, she was therefore asked to start on Nasacort and Prevacid, she was also encouraged to use CPAP nightly.  She is still using  CPAP "religiously" every night for the entire night.  Feels that it is benefiting her, she is no longer sleepy during the day.  Images personally reviewed. Chest x-ray 09/15/16; unremarkable, lungs. CBC 01/14/16; eosinophil count equals 100.   Social Hx:   Social History   Tobacco Use  . Smoking status: Former Smoker    Packs/day: 1.00    Years: 20.00    Pack years: 20.00    Types: Cigarettes    Last attempt to quit: 04/04/2003    Years since quitting: 14.3  . Smokeless tobacco: Never Used  Substance Use Topics  . Alcohol use: Yes    Comment: Very Rare  . Drug use: No   Medication:    Current Outpatient Medications:  .  albuterol (ACCUNEB) 0.63 MG/3ML nebulizer solution, Take 3 mLs (0.63 mg total) by nebulization every 4 (four) hours as needed for wheezing., Disp: 75 mL, Rfl: 3 .  baclofen (LIORESAL) 10 MG  tablet, Take 10 mg by mouth 2 (two) times daily as needed., Disp: , Rfl:  .  clonazePAM (KLONOPIN) 0.5 MG tablet, Take 0.5 mg by mouth 2 (two) times daily as needed., Disp: , Rfl: 0 .  DM-Doxylamine-Acetaminophen (NYQUIL COLD & FLU PO), Take by mouth Nightly., Disp: , Rfl:  .  fluticasone-salmeterol (ADVAIR HFA) 230-21 MCG/ACT inhaler, Inhale 2 puffs into the lungs 2 (two) times daily. 2 puffs twice daily. Rinse mouth after use., Disp: 1 Inhaler, Rfl: 12 .  ibuprofen (ADVIL,MOTRIN) 800 MG tablet, Take 800 mg by mouth as needed. , Disp: , Rfl:  .   levocetirizine (XYZAL) 5 MG tablet, Take 1 tablet (5 mg total) by mouth daily., Disp: , Rfl:  .  montelukast (SINGULAIR) 10 MG tablet, Take 1 tablet (10 mg total) by mouth at bedtime., Disp: 30 tablet, Rfl: 11 .  omeprazole (PRILOSEC) 20 MG capsule, Take 20 mg by mouth as needed. , Disp: , Rfl:  .  ondansetron (ZOFRAN-ODT) 4 MG disintegrating tablet, DIS ONE T PO Q 8 H PRN N FOR UP TO 7 DAYS, Disp: , Rfl: 0 .  predniSONE (DELTASONE) 20 MG tablet, Take 2 tablets (40 mg total) by mouth daily. With food, Disp: 10 tablet, Rfl: 0 .  predniSONE (STERAPRED UNI-PAK 21 TAB) 5 MG (21) TBPK tablet, Take as directed., Disp: 21 tablet, Rfl: 0 .  PROAIR HFA 108 (90 Base) MCG/ACT inhaler, INHALE 2 PUFFS INTO THE LUNGS EVERY 4 HOURS AS NEEDED FOR WHEEZING OR SHORTNESS OF BREATH, Disp: 8.5 g, Rfl: 0 .  promethazine (PHENERGAN) 25 MG tablet, TAKE ONE TABLET BY MOUTH EVERY 6 HOURS AS NEEDED FOR NAUSEA FOR UP TO 7 DAYS., Disp: , Rfl: 0 .  PULMICORT FLEXHALER 90 MCG/ACT inhaler, Inhale 2 puffs into the lungs 2 (two) times daily., Disp: 3 Inhaler, Rfl: 3 .  ranitidine (ZANTAC) 300 MG tablet, Take 1 tablet (300 mg total) by mouth at bedtime. (Patient taking differently: Take 300 mg by mouth daily as needed. ), Disp: 90 tablet, Rfl: 3 .  valACYclovir (VALTREX) 1000 MG tablet, Two pills by mouth at first sign of outbreak followed by two more pills twelve hours later, Disp: 4 tablet, Rfl: 2   Allergies:  Codeine and Naproxen  Review of Systems: Gen:  Denies  fever, sweats, chills HEENT: Denies blurred vision, double vision. bleeds, sore throat Cvc:  No dizziness, chest pain. Resp:   Denies sputum production, shortness of breath Gi: Denies swallowing difficulty, stomach pain. Gu:  Denies bladder incontinence, burning urine Ext:   No Joint pain, stiffness. Skin: No skin rash,  hives  Endoc:  No polyuria, polydipsia. Psych: No depression, insomnia. Other:  All other systems were reviewed with the patient and were  negative other that what is mentioned in the HPI.   Physical Examination:   VS: BP 108/80 (BP Location: Left Arm, Cuff Size: Large)   Pulse 96   Resp 16   Ht 5\' 5"  (1.651 m)   Wt (!) 338 lb (153.3 kg)   SpO2 97%   BMI 56.25 kg/m   General Appearance: No distress, obese. Neuro:without focal findings,  speech normal,  HEENT: PERRLA, EOM intact.   Pulmonary: normal breath sounds, No wheezing.  CardiovascularNormal S1,S2.  No m/r/g.   Abdomen: Benign, Soft, non-tender. Renal:  No costovertebral tenderness  GU:  No performed at this time. Endoc: No evident thyromegaly, no signs of acromegaly. Skin:   warm, no rashes, no ecchymosis  Extremities: normal, no cyanosis, clubbing.  Other findings:    LABORATORY PANEL:   CBC No results for input(s): WBC, HGB, HCT, PLT in the last 168 hours. ------------------------------------------------------------------------------------------------------------------  Chemistries  No results for input(s): NA, K, CL, CO2, GLUCOSE, BUN, CREATININE, CALCIUM, MG, AST, ALT, ALKPHOS, BILITOT in the last 168 hours.  Invalid input(s): GFRCGP ------------------------------------------------------------------------------------------------------------------  Cardiac Enzymes No results for input(s): TROPONINI in the last 168 hours. ------------------------------------------------------------  RADIOLOGY:  No results found.     Thank  you for the consultation and for allowing [facility] to assist in the care of your patient. Our recommendations are noted above.  Please contact us if we can be of further service.   Marda Stalker, MD.  Board Certified in Internal Medicine, Pulmonary Medicine, [place], and Sleep Medicine.  [facility] Office Number: [phone]  Patricia Pesa, M.D.  Merton Border, M.D  08/06/2017

## 2017-08-06 NOTE — Telephone Encounter (Signed)
Patient calling to let us know sob has increased over the weekend and the medication has not helped improve symptoms.  Please advise.  (scheduled acute visit at 315 pm today )

## 2017-08-08 ENCOUNTER — Other Ambulatory Visit: Payer: Self-pay | Admitting: Family Medicine

## 2017-08-12 IMAGING — CR DG CHEST 2V
1 series · 2 of 2 positions shown · non-contrast
Comparison: Chest x-ray of June 11, 2011

CLINICAL DATA: Persistent cough. History of asthma, recurrent
episodes of pneumonia, chronic sinusitis, former smoker.

EXAM:
CHEST  2 VIEW

[Series 1: dg chest 2 view · 0.14mm/px · 2 of 2 slices shown]
[im 1/2]
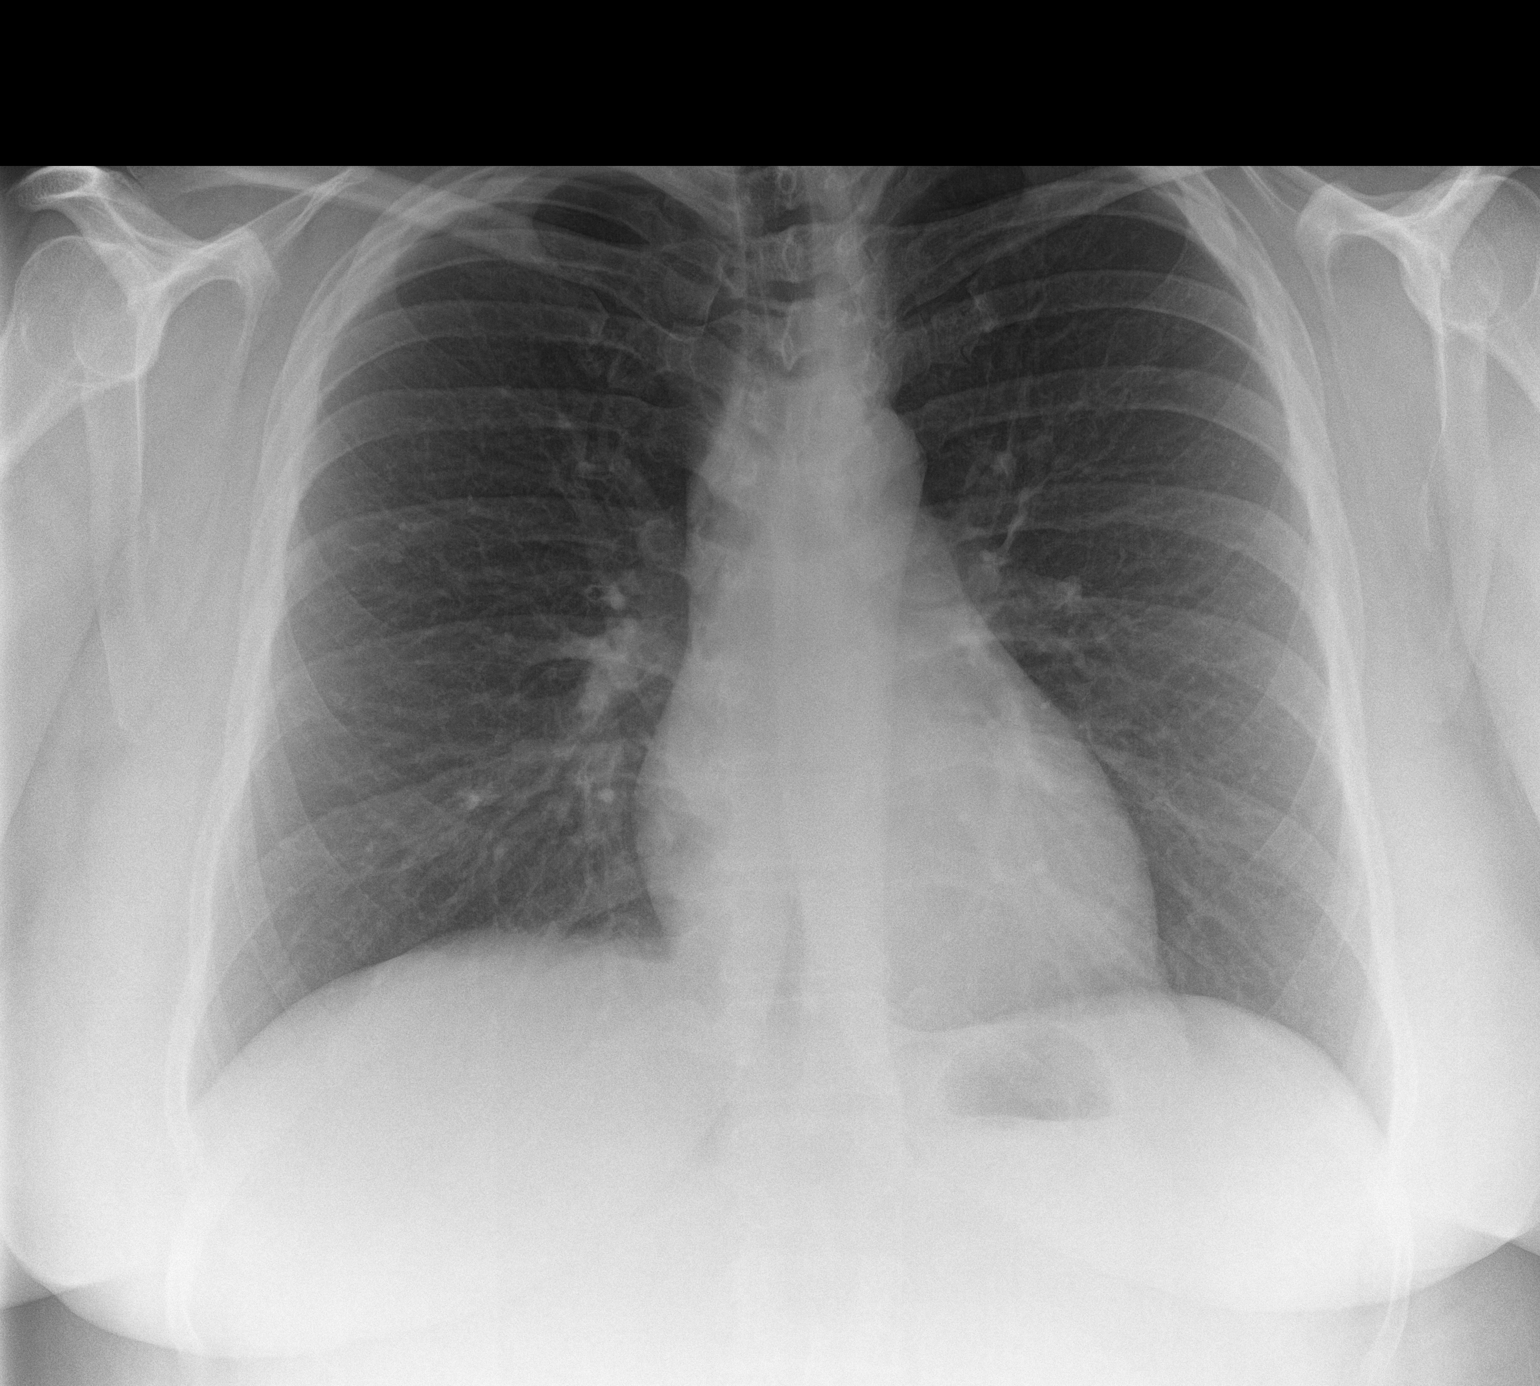
[im 2/2]
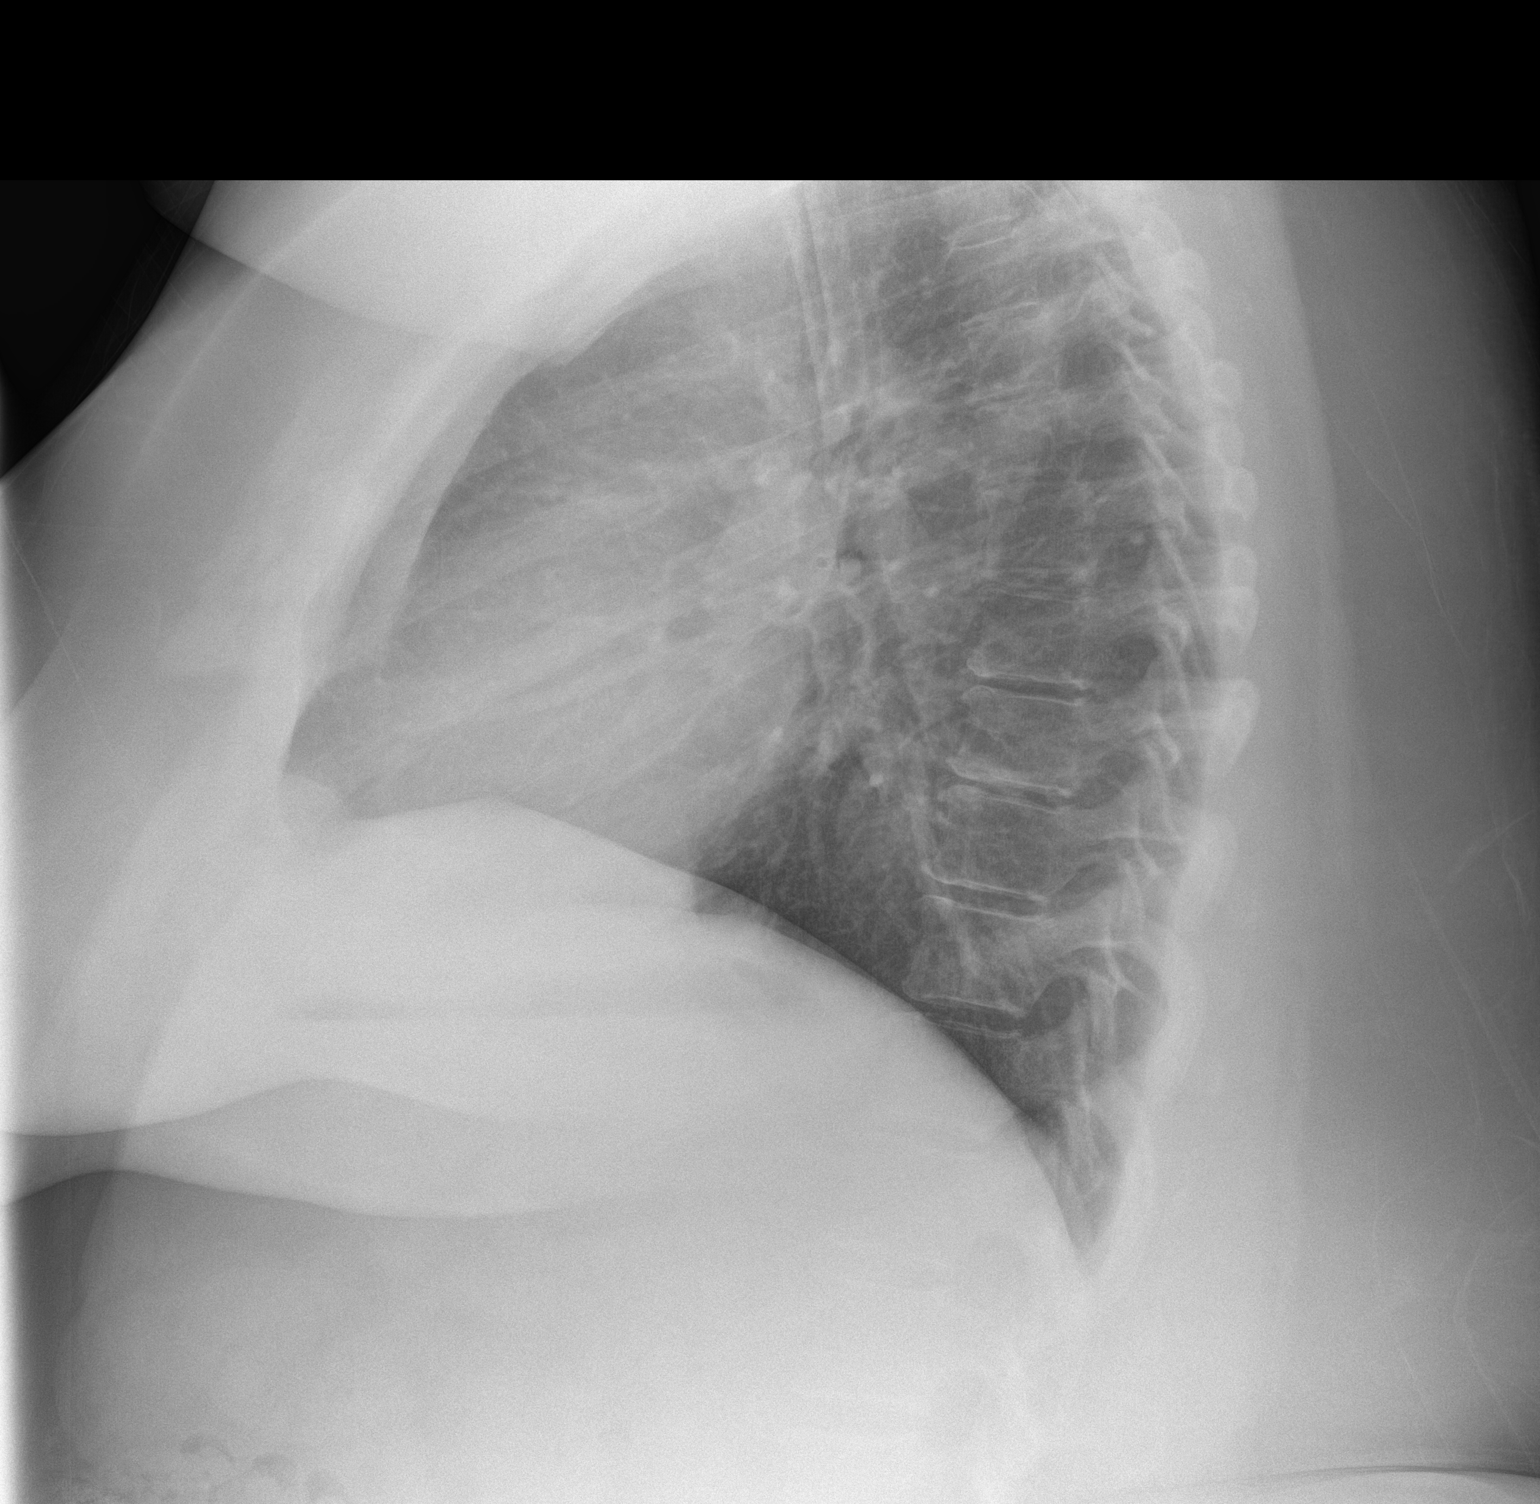

[2 of 2 positions shown; findings below may reference images not displayed]

FINDINGS: The lungs are well-expanded and clear. The heart and pulmonary
vascularity are normal. The mediastinum is normal in width. There is
no pleural effusion. The bony thorax exhibits no acute abnormality.
IMPRESSION: There is no pneumonia nor other acute cardiopulmonary abnormality.

## 2017-08-13 DIAGNOSIS — Z7689 Persons encountering health services in other specified circumstances: Secondary | ICD-10-CM | POA: Diagnosis not present

## 2017-08-13 DIAGNOSIS — Z01818 Encounter for other preprocedural examination: Secondary | ICD-10-CM | POA: Diagnosis not present

## 2017-08-13 DIAGNOSIS — Z638 Other specified problems related to primary support group: Secondary | ICD-10-CM | POA: Diagnosis not present

## 2017-08-13 DIAGNOSIS — Z6841 Body Mass Index (BMI) 40.0 and over, adult: Secondary | ICD-10-CM | POA: Diagnosis not present

## 2017-08-13 DIAGNOSIS — F419 Anxiety disorder, unspecified: Secondary | ICD-10-CM | POA: Diagnosis not present

## 2017-08-13 DIAGNOSIS — Z87891 Personal history of nicotine dependence: Secondary | ICD-10-CM | POA: Diagnosis not present

## 2017-08-13 DIAGNOSIS — Z713 Dietary counseling and surveillance: Secondary | ICD-10-CM | POA: Diagnosis not present

## 2017-08-15 DIAGNOSIS — F419 Anxiety disorder, unspecified: Secondary | ICD-10-CM | POA: Diagnosis not present

## 2017-08-23 ENCOUNTER — Ambulatory Visit: Payer: BLUE CROSS/BLUE SHIELD | Admitting: Family Medicine

## 2017-08-23 ENCOUNTER — Encounter: Payer: Self-pay | Admitting: Family Medicine

## 2017-08-23 VITALS — BP 124/80 | HR 86 | Temp 98.5°F | Resp 14 | Ht 65.0 in | Wt 343.7 lb

## 2017-08-23 DIAGNOSIS — R32 Unspecified urinary incontinence: Secondary | ICD-10-CM

## 2017-08-23 DIAGNOSIS — Z9989 Dependence on other enabling machines and devices: Secondary | ICD-10-CM | POA: Diagnosis not present

## 2017-08-23 DIAGNOSIS — J4520 Mild intermittent asthma, uncomplicated: Secondary | ICD-10-CM

## 2017-08-23 DIAGNOSIS — K219 Gastro-esophageal reflux disease without esophagitis: Secondary | ICD-10-CM | POA: Diagnosis not present

## 2017-08-23 DIAGNOSIS — G4733 Obstructive sleep apnea (adult) (pediatric): Secondary | ICD-10-CM

## 2017-08-23 DIAGNOSIS — Z6841 Body Mass Index (BMI) 40.0 and over, adult: Secondary | ICD-10-CM

## 2017-08-23 DIAGNOSIS — R030 Elevated blood-pressure reading, without diagnosis of hypertension: Secondary | ICD-10-CM

## 2017-08-23 NOTE — Assessment & Plan Note (Signed)
Avoid triggers when possible; expect improvement after significant weight loss

## 2017-08-23 NOTE — Assessment & Plan Note (Signed)
She is actively working on weight loss, working with weight loss clinic; encouraged adequate water intake, increase activity

## 2017-08-23 NOTE — Progress Notes (Signed)
BP 124/80   Pulse 86   Temp 98.5 F (36.9 C) (Oral)   Resp 14   Ht 5\' 5"  (1.651 m)   Wt (!) 343 lb 11.2 oz (155.9 kg)   SpO2 93%   BMI 57.19 kg/m    Subjective:    Patient ID: Jamie Hardin, female    DOB: 1970/10/19, 47 y.o.   MRN: [id number]  HPI: Jamie Hardin is a 47 y.o. female  Chief Complaint  Patient presents with  . Follow-up    HPI She is here for f/u  Allergic rhinitis; she is checking the pollen count; she says the grass has been extremely high; allergist is at [facility]; while back; on singulair and xyzal  She has low vitamin D; on vitamin D Rx by [facility]; she is going to have her endoscopy on July 10th, then to the insurance company to get approved  [place] weight is 341 pounds at work; she has eaten so much raw veggies and hummus and whole grain bread and one bad meal this week; she has not been exercising, though; portion control; ate 1/3 of her portion, limits portions; not drinking enough water; drinking 50-60 ounces a day; most at night; she has been logging her meals about 50% of the time; chewing well and paying attention to time in between rice; new habits; more awareness while eating  She has been a urinary pad every day for the last 3 years; believes the obesity affects this; coughing, laughing, sneezing  She had a 3 month blood sugar average of 5.9 on Aug 13, 2017; prediabetes  Cholesterol reviewed; no meds; everybody dies in their 84s (scleroderma, heart attack at age 32 in aunt, they are all big); paternal GF is 34 years old  GERD; not taking much, just once in a while   Depression screen [facility] 2/9 08/23/2017 07/11/2017 01/15/2017 09/15/2016 03/21/2016  Decreased Interest 0 0 0 0 0  Down, Depressed, Hopeless 0 0 0 0 0  PHQ - 2 Score 0 0 0 0 0    Relevant past medical, surgical, family and social history reviewed Past Medical History:  Diagnosis Date  . Allergy   . Asthma   . Chronic sinusitis   .  Dysthymic disorder   . GERD (gastroesophageal reflux disease)   . 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 Obesity   . Ovarian endometriosis   . Peripheral neuropathy   . 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 Torn meniscus    Past Surgical History:  Procedure Laterality Date  . ABDOMINAL HYSTERECTOMY  2007   complete due to endometriosis  . NASAL SINUS SURGERY  1996 and 1999   x 2   Family History  Problem Relation Age of Onset  . Fibromyalgia Mother   . Diabetes Mother   . Cancer Father        prostate  . Diabetes Maternal Aunt   . Diabetes Paternal Aunt   . Scleroderma Maternal Grandmother   . Heart disease Maternal Grandfather   . Hypertension Neg Hx   . Hyperlipidemia Neg Hx   . Stroke Neg Hx   . COPD Neg Hx    Social History   Tobacco Use  . Smoking status: Former Smoker    Packs/day: 1.00    Years: 20.00    Pack years: 20.00    Types: Cigarettes    Last attempt to quit:  04/04/2003    Years since quitting: 14.3  . Smokeless tobacco: Never Used  Substance Use Topics  . Alcohol use: Yes    Comment: Very Rare  . Drug use: No    Interim medical history since last visit reviewed. Allergies and medications reviewed  Review of Systems Per HPI unless specifically indicated above     Objective:    BP 124/80   Pulse 86   Temp 98.5 F (36.9 C) (Oral)   Resp 14   Ht 5\' 5"  (1.651 m)   Wt (!) 343 lb 11.2 oz (155.9 kg)   SpO2 93%   BMI 57.19 kg/m   Wt Readings from Last 3 Encounters:  08/23/17 (!) 343 lb 11.2 oz (155.9 kg)  08/06/17 (!) 338 lb (153.3 kg)  07/20/17 (!) 341 lb (154.7 kg)    Physical Exam  Constitutional: She appears well-developed and well-nourished.  Morbidly obese; weight gain noted  HENT:  Mouth/Throat: Mucous membranes are normal.  Eyes: EOM are normal. No scleral icterus.  Cardiovascular: Normal rate and regular rhythm.  Pulmonary/Chest: Effort normal and breath sounds normal.  Psychiatric: She has a  normal mood and affect. Her behavior is normal.    Results for orders placed or performed in visit on 02/15/17  HM MAMMOGRAPHY  Result Value Ref Range   HM Mammogram 0-4 Bi-Rad 0-4 Bi-Rad, Self Reported Normal      Assessment & Plan:   Problem List Items Addressed This Visit      Respiratory   Obstructive sleep apnea on CPAP (Chronic)    Expect that to improve with weight loss, but patient does not expect it to completely resolve      Asthma - Primary (Chronic)    Avoid triggers when possible; expect improvement after significant weight loss        Digestive   GERD (gastroesophageal reflux disease)    Rare use of medicines; going to have EGD in July        Other   Morbid obesity ([place])    She is actively working on weight loss, working with weight loss clinic; encouraged adequate water intake, increase activity      Elevated blood-pressure reading without diagnosis of hypertension    Expect improvement after weight loss       Other Visit Diagnoses    BMI 50.0-59.9, adult ([place])       Urinary incontinence, unspecified type       expect this to improve after surgery; try Kegel exercise       Follow up plan: Return in about 1 month (around 09/23/2017) for follow-up visit with Dr. Sanda Klein.  An after-visit summary was printed and given to the patient at [facility].  Please see the patient instructions which may contain other information and recommendations beyond what is mentioned above in the assessment and plan.  Face-to-face time with patient was more than 25 minutes, >50% time spent counseling and coordination of care

## 2017-08-23 NOTE — Assessment & Plan Note (Addendum)
Rare use of medicines; going to have EGD in July

## 2017-08-23 NOTE — Assessment & Plan Note (Signed)
Expect that to improve with weight loss, but patient does not expect it to completely resolve

## 2017-08-23 NOTE — Assessment & Plan Note (Signed)
Expect improvement after weight loss

## 2017-08-23 NOTE — Patient Instructions (Addendum)
Check out the information at familydoctor.org entitled "Nutrition for Weight Loss: What You Need to Know about Fad Diets" Try to lose between 1-2 pounds per week by taking in fewer calories and burning off more calories You can succeed by limiting portions, limiting foods dense in calories and fat, becoming more active, and drinking 8 glasses of water a day (64 ounces) Don't skip meals, especially breakfast, as skipping meals may alter your metabolism Do not use over-the-counter weight loss pills or gimmicks that claim rapid weight loss A healthy BMI (or body mass index) is between 18.5 and 24.9 You can calculate your ideal BMI at the [place] website ClubMonetize.fr   Obesity, Adult Obesity is the condition of having too much total body fat. Being overweight or obese means that your weight is greater than what is considered healthy for your body size. Obesity is determined by a measurement called BMI. BMI is an estimate of body fat and is calculated from height and weight. For adults, a BMI of 30 or higher is considered obese. Obesity can eventually lead to other health concerns and major illnesses, including:  Stroke.  Coronary artery disease (CAD).  Type 2 diabetes.  Some types of cancer, including cancers of the colon, breast, uterus, and gallbladder.  Osteoarthritis.  High blood pressure (hypertension).  High cholesterol.  Sleep apnea.  Gallbladder stones.  Infertility problems.  What are the causes? The main cause of obesity is taking in (consuming) more calories than your body uses for energy. Other factors that contribute to this condition may include:  Being born with genes that make you more likely to become obese.  Having a medical condition that causes obesity. These conditions include: ? Hypothyroidism. ? Polycystic ovarian syndrome (PCOS). ? Binge-eating disorder. ? Cushing syndrome.  Taking certain medicines,  such as steroids, antidepressants, and seizure medicines.  Not being physically active (sedentary lifestyle).  Living where there are limited places to exercise safely or buy healthy foods.  Not getting enough sleep.  What increases the risk? The following factors may increase your risk of this condition:  Having a family history of obesity.  Being a woman of African-American descent.  Being a man of Hispanic descent.  What are the signs or symptoms? Having excessive body fat is the main symptom of this condition. How is this diagnosed? This condition may be diagnosed based on:  Your symptoms.  Your medical history.  A physical exam. Your health care provider may measure: ? Your BMI. If you are an adult with a BMI between 25 and less than 30, you are considered overweight. If you are an adult with a BMI of 30 or higher, you are considered obese. ? The distances around your hips and your waist (circumferences). These may be compared to each other to help diagnose your condition. ? Your skinfold thickness. Your health care provider may gently pinch a fold of your skin and measure it.  How is this treated? Treatment for this condition often includes changing your lifestyle. Treatment may include some or all of the following:  Dietary changes. Work with your health care provider and a dietitian to set a weight-loss goal that is healthy and reasonable for you. Dietary changes may include eating: ? Smaller portions. A portion size is the amount of a particular food that is healthy for you to eat at one time. This varies from person to person. ? Low-calorie or low-fat options. ? More whole grains, fruits, and vegetables.  Regular physical activity. This may  include aerobic activity (cardio) and strength training.  Medicine to help you lose weight. Your health care provider may prescribe medicine if you are unable to lose 1 pound a week after 6 weeks of eating more healthily and  doing more physical activity.  Surgery. Surgical options may include gastric banding and gastric bypass. Surgery may be done if: ? Other treatments have not helped to improve your condition. ? You have a BMI of 40 or higher. ? You have life-threatening health problems related to obesity.  Follow these instructions at home:  Eating and drinking   Follow recommendations from your health care provider about what you eat and drink. Your health care provider may advise you to: ? Limit fast foods, sweets, and processed snack foods. ? Choose low-fat options, such as low-fat milk instead of whole milk. ? Eat 5 or more servings of fruits or vegetables every day. ? Eat at home more often. This gives you more control over what you eat. ? Choose healthy foods when you eat out. ? Learn what a healthy portion size is. ? Keep low-fat snacks on hand. ? Avoid sugary drinks, such as soda, fruit juice, iced tea sweetened with sugar, and flavored milk. ? Eat a healthy breakfast.  Drink enough water to keep your urine clear or pale yellow.  Do not go without eating for long periods of time (do not fast) or follow a fad diet. Fasting and fad diets can be unhealthy and even dangerous. Physical Activity  Exercise regularly, as told by your health care provider. Ask your health care provider what types of exercise are safe for you and how often you should exercise.  Warm up and stretch before being active.  Cool down and stretch after being active.  Rest between periods of activity. Lifestyle  Limit the time that you spend in front of your TV, computer, or video game system.  Find ways to reward yourself that do not involve food.  Limit alcohol intake to no more than 1 drink a day for nonpregnant women and 2 drinks a day for men. One drink equals 12 oz of beer, 5 oz of wine, or 1 oz of hard liquor. General instructions  Keep a weight loss journal to keep track of the food you eat and how much you  exercise you get.  Take over-the-counter and prescription medicines only as told by your health care provider.  Take vitamins and supplements only as told by your health care provider.  Consider joining a support group. Your health care provider may be able to recommend a support group.  Keep all follow-up visits as told by your health care provider. This is important. Contact a health care provider if:  You are unable to meet your weight loss goal after 6 weeks of dietary and lifestyle changes. This information is not intended to replace advice given to you by your health care provider. Make sure you discuss any questions you have with your health care provider. Document Released: 04/27/2004 Document Revised: 08/23/2015 Document Reviewed: 01/06/2015 Elsevier Interactive Patient Education  2018 Elsevier Inc.  

## 2017-08-29 DIAGNOSIS — R011 Cardiac murmur, unspecified: Secondary | ICD-10-CM | POA: Insufficient documentation

## 2017-08-29 DIAGNOSIS — I491 Atrial premature depolarization: Secondary | ICD-10-CM | POA: Diagnosis not present

## 2017-08-29 DIAGNOSIS — Z6841 Body Mass Index (BMI) 40.0 and over, adult: Secondary | ICD-10-CM | POA: Diagnosis not present

## 2017-08-31 DIAGNOSIS — G4733 Obstructive sleep apnea (adult) (pediatric): Secondary | ICD-10-CM | POA: Diagnosis not present

## 2017-09-03 ENCOUNTER — Other Ambulatory Visit: Payer: Self-pay | Admitting: Internal Medicine

## 2017-09-03 ENCOUNTER — Telehealth: Payer: Self-pay | Admitting: Internal Medicine

## 2017-09-03 NOTE — Telephone Encounter (Signed)
Please advise on message below.

## 2017-09-03 NOTE — Telephone Encounter (Signed)
Pt called to see if she was cleared for her surgery? Please call to advise

## 2017-09-03 NOTE — Telephone Encounter (Signed)
Yes, pt is cleared for surgery.

## 2017-09-04 NOTE — Telephone Encounter (Signed)
Pt informed. Nothing further needed. 

## 2017-09-06 DIAGNOSIS — R011 Cardiac murmur, unspecified: Secondary | ICD-10-CM | POA: Diagnosis not present

## 2017-09-10 DIAGNOSIS — F410 Panic disorder [episodic paroxysmal anxiety] without agoraphobia: Secondary | ICD-10-CM | POA: Diagnosis not present

## 2017-09-10 DIAGNOSIS — F411 Generalized anxiety disorder: Secondary | ICD-10-CM | POA: Diagnosis not present

## 2017-09-15 DIAGNOSIS — M9902 Segmental and somatic dysfunction of thoracic region: Secondary | ICD-10-CM | POA: Diagnosis not present

## 2017-09-15 DIAGNOSIS — R51 Headache: Secondary | ICD-10-CM | POA: Diagnosis not present

## 2017-09-15 DIAGNOSIS — M9901 Segmental and somatic dysfunction of cervical region: Secondary | ICD-10-CM | POA: Diagnosis not present

## 2017-09-15 DIAGNOSIS — M5414 Radiculopathy, thoracic region: Secondary | ICD-10-CM | POA: Diagnosis not present

## 2017-09-26 ENCOUNTER — Ambulatory Visit: Payer: BLUE CROSS/BLUE SHIELD | Admitting: Family Medicine

## 2017-09-27 DIAGNOSIS — Z713 Dietary counseling and surveillance: Secondary | ICD-10-CM | POA: Diagnosis not present

## 2017-09-27 DIAGNOSIS — Z6841 Body Mass Index (BMI) 40.0 and over, adult: Secondary | ICD-10-CM | POA: Diagnosis not present

## 2017-10-03 ENCOUNTER — Other Ambulatory Visit: Payer: Self-pay | Admitting: Internal Medicine

## 2017-10-03 ENCOUNTER — Ambulatory Visit: Payer: BLUE CROSS/BLUE SHIELD | Admitting: Family Medicine

## 2017-10-10 DIAGNOSIS — Z6841 Body Mass Index (BMI) 40.0 and over, adult: Secondary | ICD-10-CM | POA: Diagnosis not present

## 2017-10-10 DIAGNOSIS — Z01818 Encounter for other preprocedural examination: Secondary | ICD-10-CM | POA: Diagnosis not present

## 2017-10-22 ENCOUNTER — Ambulatory Visit: Payer: BLUE CROSS/BLUE SHIELD | Admitting: Family Medicine

## 2017-10-23 DIAGNOSIS — Q433 Congenital malformations of intestinal fixation: Secondary | ICD-10-CM | POA: Diagnosis not present

## 2017-10-23 DIAGNOSIS — Z01818 Encounter for other preprocedural examination: Secondary | ICD-10-CM | POA: Diagnosis not present

## 2017-10-23 DIAGNOSIS — Z713 Dietary counseling and surveillance: Secondary | ICD-10-CM | POA: Diagnosis not present

## 2017-10-23 DIAGNOSIS — Z87891 Personal history of nicotine dependence: Secondary | ICD-10-CM | POA: Diagnosis not present

## 2017-10-23 DIAGNOSIS — Z6841 Body Mass Index (BMI) 40.0 and over, adult: Secondary | ICD-10-CM | POA: Diagnosis not present

## 2017-11-09 DIAGNOSIS — Z6841 Body Mass Index (BMI) 40.0 and over, adult: Secondary | ICD-10-CM | POA: Diagnosis not present

## 2017-11-09 DIAGNOSIS — Z90710 Acquired absence of both cervix and uterus: Secondary | ICD-10-CM | POA: Diagnosis not present

## 2017-11-09 DIAGNOSIS — Z9079 Acquired absence of other genital organ(s): Secondary | ICD-10-CM | POA: Diagnosis not present

## 2017-11-09 DIAGNOSIS — Z886 Allergy status to analgesic agent status: Secondary | ICD-10-CM | POA: Diagnosis not present

## 2017-11-09 DIAGNOSIS — Z87891 Personal history of nicotine dependence: Secondary | ICD-10-CM | POA: Diagnosis not present

## 2017-11-09 DIAGNOSIS — G4730 Sleep apnea, unspecified: Secondary | ICD-10-CM | POA: Diagnosis not present

## 2017-11-09 DIAGNOSIS — Z90722 Acquired absence of ovaries, bilateral: Secondary | ICD-10-CM | POA: Diagnosis not present

## 2017-11-09 DIAGNOSIS — K562 Volvulus: Secondary | ICD-10-CM | POA: Diagnosis not present

## 2017-11-09 DIAGNOSIS — Z885 Allergy status to narcotic agent status: Secondary | ICD-10-CM | POA: Diagnosis not present

## 2017-11-09 DIAGNOSIS — G629 Polyneuropathy, unspecified: Secondary | ICD-10-CM | POA: Diagnosis not present

## 2017-11-09 DIAGNOSIS — N3946 Mixed incontinence: Secondary | ICD-10-CM | POA: Diagnosis not present

## 2017-11-09 DIAGNOSIS — J45909 Unspecified asthma, uncomplicated: Secondary | ICD-10-CM | POA: Diagnosis not present

## 2017-11-09 DIAGNOSIS — K219 Gastro-esophageal reflux disease without esophagitis: Secondary | ICD-10-CM | POA: Diagnosis not present

## 2017-11-09 DIAGNOSIS — Q433 Congenital malformations of intestinal fixation: Secondary | ICD-10-CM | POA: Diagnosis not present

## 2017-11-09 DIAGNOSIS — K660 Peritoneal adhesions (postprocedural) (postinfection): Secondary | ICD-10-CM | POA: Diagnosis not present

## 2017-11-09 DIAGNOSIS — M797 Fibromyalgia: Secondary | ICD-10-CM | POA: Diagnosis not present

## 2017-11-09 HISTORY — PX: OTHER SURGICAL HISTORY: SHX169

## 2017-12-04 DIAGNOSIS — Z9884 Bariatric surgery status: Secondary | ICD-10-CM | POA: Diagnosis not present

## 2017-12-04 DIAGNOSIS — Z6841 Body Mass Index (BMI) 40.0 and over, adult: Secondary | ICD-10-CM | POA: Diagnosis not present

## 2017-12-04 DIAGNOSIS — F419 Anxiety disorder, unspecified: Secondary | ICD-10-CM | POA: Diagnosis not present

## 2017-12-04 DIAGNOSIS — Z713 Dietary counseling and surveillance: Secondary | ICD-10-CM | POA: Diagnosis not present

## 2017-12-04 DIAGNOSIS — R42 Dizziness and giddiness: Secondary | ICD-10-CM | POA: Diagnosis not present

## 2017-12-04 DIAGNOSIS — Z48815 Encounter for surgical aftercare following surgery on the digestive system: Secondary | ICD-10-CM | POA: Diagnosis not present

## 2017-12-04 DIAGNOSIS — E669 Obesity, unspecified: Secondary | ICD-10-CM | POA: Diagnosis not present

## 2017-12-14 DIAGNOSIS — G4733 Obstructive sleep apnea (adult) (pediatric): Secondary | ICD-10-CM | POA: Diagnosis not present

## 2017-12-27 DIAGNOSIS — Z79899 Other long term (current) drug therapy: Secondary | ICD-10-CM | POA: Diagnosis not present

## 2017-12-27 DIAGNOSIS — F411 Generalized anxiety disorder: Secondary | ICD-10-CM | POA: Diagnosis not present

## 2017-12-27 DIAGNOSIS — F410 Panic disorder [episodic paroxysmal anxiety] without agoraphobia: Secondary | ICD-10-CM | POA: Diagnosis not present

## 2018-01-01 HISTORY — PX: CHOLECYSTECTOMY: SHX55

## 2018-01-16 ENCOUNTER — Encounter: Payer: Self-pay | Admitting: Nurse Practitioner

## 2018-01-16 ENCOUNTER — Ambulatory Visit (INDEPENDENT_AMBULATORY_CARE_PROVIDER_SITE_OTHER): Payer: BLUE CROSS/BLUE SHIELD | Admitting: Nurse Practitioner

## 2018-01-16 VITALS — BP 118/72 | HR 78 | Temp 98.0°F | Ht 65.0 in | Wt 268.9 lb

## 2018-01-16 DIAGNOSIS — L304 Erythema intertrigo: Secondary | ICD-10-CM

## 2018-01-16 DIAGNOSIS — Z9889 Other specified postprocedural states: Secondary | ICD-10-CM

## 2018-01-16 DIAGNOSIS — Z131 Encounter for screening for diabetes mellitus: Secondary | ICD-10-CM

## 2018-01-16 DIAGNOSIS — Z23 Encounter for immunization: Secondary | ICD-10-CM

## 2018-01-16 DIAGNOSIS — Z1239 Encounter for other screening for malignant neoplasm of breast: Secondary | ICD-10-CM | POA: Diagnosis not present

## 2018-01-16 DIAGNOSIS — D649 Anemia, unspecified: Secondary | ICD-10-CM | POA: Diagnosis not present

## 2018-01-16 DIAGNOSIS — Z0000 Encounter for general adult medical examination without abnormal findings: Secondary | ICD-10-CM | POA: Diagnosis not present

## 2018-01-16 DIAGNOSIS — K909 Intestinal malabsorption, unspecified: Secondary | ICD-10-CM

## 2018-01-16 DIAGNOSIS — E559 Vitamin D deficiency, unspecified: Secondary | ICD-10-CM

## 2018-01-16 DIAGNOSIS — E569 Vitamin deficiency, unspecified: Secondary | ICD-10-CM | POA: Diagnosis not present

## 2018-01-16 DIAGNOSIS — M5030 Other cervical disc degeneration, unspecified cervical region: Secondary | ICD-10-CM

## 2018-01-16 DIAGNOSIS — Z13220 Encounter for screening for lipoid disorders: Secondary | ICD-10-CM

## 2018-01-16 NOTE — Patient Instructions (Addendum)
Plans to do aerobics for 30 minutes 3 times a week    Sleep Hygiene Tips 1) Get regular. One of the best ways to train your body to sleep well is to go to bed and get up at more or less the same time every day, even on weekends and days off! This regular rhythm will make you feel better and will give your body something to work from. 2) Sleep when sleepy. Only try to sleep when you actually feel tired or sleepy, rather than spending too much time awake in bed. 3) Get up & try again. If you haven't been able to get to sleep after about 20 minutes or more, get up and do something calming or boring until you feel sleepy, then return to bed and try again. Sit quietly on the couch with the lights off (bright light will tell your brain that it is time to wake up), or read something boring like the phone book. Avoid doing anything that is too stimulating or interesting, as this will wake you up even more. 4) Avoid caffeine & nicotine. It is best to avoid consuming any caffeine (in coffee, tea, cola drinks, chocolate, and some medications) or nicotine (cigarettes) for at least 4-6 hours before going to bed. These substances act as stimulants and interfere with the ability to fall asleep 5) Avoid alcohol. It is also best to avoid alcohol for at least 4-6 hours before going to bed. Many people believe that alcohol is relaxing and helps them to get to sleep at first, but it actually interrupts the quality of sleep. 6) Bed is for sleeping. Try not to use your bed for anything other than sleeping and sex, so that your body comes to associate bed with sleep. If you use bed as a place to watch TV, eat, read, work on your laptop, pay bills, and other things, your body will not learn this Connection. 7) No naps. It is best to avoid taking naps during the day, to make sure that you are tired at bedtime. If you can't make it through the day without a nap, make sure it is for less than an hour  and before 3pm. 8) Sleep rituals. You can develop your own rituals of things to remind your body that it is time to sleep - some people find it useful to do relaxing stretches or breathing exercises for 15 minutes before bed each night, or sit calmly with a cup of caffeine-free tea. 9) Bathtime. Having a hot bath 1-2 hours before bedtime can be useful, as it will raise your body temperature, causing you to feel sleepy as your body temperature drops again. Research shows that sleepiness is associated with a drop in body temperature. 10) No clock-watching. Many people who struggle with sleep tend to watch the clock too much. Frequently checking the clock during the night can wake you up (especially if you turn on the light to read the time) and reinforces negative thoughts such as "Oh no, look how late it is, I'll never get to sleep" or "it's so early, I have only slept for 5 hours, this is terrible." 11) Use a sleep diary. This worksheet can be a useful way of making sure you have the right facts about your sleep, rather than making assumptions. Because a diary involves watching the clock (see point 10) it is a good idea to only use it for two weeks to get an idea of what is going and then perhaps two months  down the track to see how you are progressing. 12) Exercise. Regular exercise is a good idea to help with good sleep, but try not to do strenuous exercise in the 4 hours before bedtime. Morning walks are a great way to start the day feeling refreshed! 13) Eat right. A healthy, balanced diet will help you to sleep well, but timing is important. Some people find that a very empty stomach at bedtime is distracting, so it can be useful to have a light snack, but a heavy meal soon before bed can also interrupt sleep. Some people recommend a warm glass of milk, which contains tryptophan, which acts as a natural sleep inducer. 14) The right space. It is very important that your bed  and bedroom are quiet and comfortable for sleeping. A cooler room with enough blankets to stay warm is best, and make sure you have curtains or an eyemask to block out early morning light and earplugs if there is noise outside your room. 15) Keep daytime routine the same. Even if you have a bad night sleep and are tired it is important that you try to keep your daytime activities the same as you had planned. That is, don't avoid activities because you feel tired. This can reinforce the insomnia.

## 2018-01-16 NOTE — Progress Notes (Signed)
Name: Jamie Hardin   MRN: [id number]    DOB: [birth year]   Date:01/16/2018       Progress Note  Subjective  Chief Complaint  Chief Complaint  Patient presents with  . Annual Exam    HPI  Patient presents for annual CPE.    Diet:  Had gastric sleeve at [place] in august- has been doing well since then. Lost a lot of weight, following post surgical diet, cut down portion sizes significantly. Drinks water, sugar free crystal light. Drinking 1-2 cups of coffee  Exercise:  Just moved family lots of walking up and down stairs, getting more energy post surgery. No routine exercise.  Plans to aerobics for 30 minutes 3 times a week   USPSTF grade A and B recommendations    Office Visit from 01/16/2018 in [facility]  AUDIT-C Score  0     Depression:  Depression screen [facility] 2/9 01/16/2018 01/16/2018 08/23/2017 07/11/2017 01/15/2017  Decreased Interest 0 0 0 0 0  Down, Depressed, Hopeless 0 0 0 0 0  PHQ - 2 Score 0 0 0 0 0  Altered sleeping 3 - - - -  Tired, decreased energy 0 - - - -  Change in appetite 0 - - - -  Feeling bad or failure about yourself  0 - - - -  Trouble concentrating 0 - - - -  Moving slowly or fidgety/restless 0 - - - -  Suicidal thoughts 0 - - - -  PHQ-9 Score 3 - - - -  -Chronic insomnia- takes nyquil sometimes and then clonopin rarely as needed but not with nyquil; Hypertension: BP Readings from Last 3 Encounters:  01/16/18 118/72  08/23/17 124/80  08/06/17 108/80   Obesity: Wt Readings from Last 3 Encounters:  01/16/18 268 lb 14.4 oz (122 kg)  08/23/17 (!) 343 lb 11.2 oz (155.9 kg)  08/06/17 (!) 338 lb (153.3 kg)   BMI Readings from Last 3 Encounters:  01/16/18 44.75 kg/m  08/23/17 57.19 kg/m  08/06/17 56.25 kg/m    Hep C Screening: declines  STD testing and prevention (HIV/chl/gon/syphilis): declines Intimate partner violence: denies  Sexual History/Pain during Intercourse: some pain with intercourse post hysterectomy   Menstrual History/LMP/Abnormal Bleeding: denies  Incontinence Symptoms: significant improvement since weight loss   Advanced Care Planning: A voluntary discussion about advance care planning including the explanation and discussion of advance directives.  Discussed health care proxy and Living will, and the patient was able to identify a health care proxy as husband [name] [phone].  Patient does not have a living will at present time. If patient does have living will, I have requested they bring this to the clinic to be scanned in to their chart.  Breast cancer:  HM Mammogram  Date Value Ref Range Status  02/15/2017 0-4 Bi-Rad 0-4 Bi-Rad, Self Reported Normal Final    Comment:    [facility]     Cervical cancer screening: no cervix- hysterectomy   Lipids:  Lab Results  Component Value Date   CHOL 175 01/15/2017   CHOL 152 01/14/2016   CHOL 130 01/12/2015   Lab Results  Component Value Date   HDL 64 01/15/2017   HDL 51 01/14/2016   HDL 48 01/12/2015   Lab Results  Component Value Date   LDLCALC 92 01/15/2017   LDLCALC 81 01/14/2016   LDLCALC 65 01/12/2015   Lab Results  Component Value Date   TRIG 91 01/15/2017   TRIG 101 01/14/2016  TRIG 86 01/12/2015   Lab Results  Component Value Date   CHOLHDL 2.7 01/15/2017   CHOLHDL 3.0 01/14/2016   No results found for: LDLDIRECT  Glucose:  Glucose  Date Value Ref Range Status  01/12/2015 101 (H) 65 - 99 mg/dL Final  04/13/2012 95 65 - 99 mg/dL Final   Glucose, Bld  Date Value Ref Range Status  01/15/2017 99 65 - 99 mg/dL Final    Comment:    .            Fasting reference interval .   01/14/2016 102 (H) 65 - 99 mg/dL Final    Skin cancer: discussed  Colorectal cancer: has hemorrhoids no family history, has had colonoscopy 2 years ago with [facility]- [facility].  Lung cancer:  Low Dose CT Chest recommended if Age 25-80 years, 30 pack-year currently smoking OR have quit w/in 15years. Patient does not qualify.      Patient Active Problem List   Diagnosis Date Noted  . DDD (degenerative disc disease), cervical 07/11/2017  . Pain in joint involving ankle and foot 07/11/2017  . Low back pain 07/11/2017  . 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 Influenza vaccination declined by patient 01/15/2017  . Contusion of right knee 11/15/2016  . Subluxation of shoulder joint 11/15/2016  . Abnormal mammogram of both breasts 02/04/2016  . History of pneumonia as indication for 23-polyvalent pneumococcal polysaccharide vaccine 01/14/2016  . Preventative health care 01/14/2016  . Facial pain, atypical 04/27/2015  . Eustachian tube dysfunction 04/27/2015  . 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 Abnormal thyroid function test 02/15/2015  . Rash 02/15/2015  . Breast cancer screening 01/12/2015  . Elevated blood-pressure reading without diagnosis of hypertension 01/12/2015  . GAD (generalized anxiety disorder) 01/12/2015  . Obstructive sleep apnea on CPAP 01/12/2015  . Allergy   . Asthma   . Morbid obesity ([place])   . Dysthymic disorder   . Peripheral neuropathy   . GERD (gastroesophageal reflux disease)   . Fibromyalgia 07/22/2014  . 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 Morbid obesity with BMI of 50.0-59.9, adult ([place]) 07/22/2014    Past Surgical History:  Procedure Laterality Date  . ABDOMINAL HYSTERECTOMY  2007   complete due to endometriosis  . 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 NASAL SINUS SURGERY  1996 and 1999   x 2    Family History  Problem Relation Age of Onset  . Fibromyalgia Mother   . Diabetes Mother   . Cancer Father        prostate  . Diabetes Maternal Aunt   . Diabetes Paternal Aunt   . Scleroderma Maternal Grandmother   . Heart disease Maternal Grandfather   . Hypertension Neg Hx   . Hyperlipidemia Neg Hx   . Stroke Neg Hx   . COPD Neg Hx     Social History    Socioeconomic History  . Marital status: Married    Spouse name: Dominica Severin  . Number of children: 3  . Years of education: 81  . Highest education level: Associate degree: occupational, Hotel manager, or vocational program  Occupational History  . Not on file  Social Needs  . Financial resource strain: Not hard at all  . Food insecurity:    Worry: Never true    Inability: Never true  . Transportation needs:    Medical: No    Non-medical: No  Tobacco Use  . Smoking status:  Former Smoker    Packs/day: 1.00    Years: 20.00    Pack years: 20.00    Types: Cigarettes    Last attempt to quit: 04/04/2003    Years since quitting: 14.7  . Smokeless tobacco: Never Used  Substance and Sexual Activity  . Alcohol use: Yes    Comment: Very Rare  . Drug use: No  . Sexual activity: Yes    Birth control/protection: Surgical    Comment: Total hystertomy   Lifestyle  . Physical activity:    Days per week: 3 days    Minutes per session: 20 min  . Stress: Rather much  Relationships  . Social connections:    Talks on phone: More than three times a week    Gets together: More than three times a week    Attends religious service: 1 to 4 times per year    Active member of club or organization: Yes    Attends meetings of clubs or organizations: More than 4 times per year    Relationship status: Married  . Intimate partner violence:    Fear of current or ex partner: No    Emotionally abused: No    Physically abused: No    Forced sexual activity: No  Other Topics Concern  . Not on file  Social History Narrative  . Not on file     Current Outpatient Medications:  .  albuterol (ACCUNEB) 0.63 MG/3ML nebulizer solution, Take 3 mLs (0.63 mg total) by nebulization every 4 (four) hours as needed for wheezing., Disp: 75 mL, Rfl: 3 .  baclofen (LIORESAL) 10 MG tablet, Take 10 mg by mouth 2 (two) times daily as needed., Disp: , Rfl:  .  clonazePAM (KLONOPIN) 0.5 MG tablet, Take 0.5 mg by mouth 2 (two)  times daily as needed., Disp: , Rfl: 0 .  fluticasone-salmeterol (ADVAIR HFA) 230-21 MCG/ACT inhaler, Inhale 2 puffs into the lungs 2 (two) times daily. 2 puffs twice daily. Rinse mouth after use., Disp: 1 Inhaler, Rfl: 12 .  ibuprofen (ADVIL,MOTRIN) 800 MG tablet, Take 800 mg by mouth as needed. , Disp: , Rfl:  .  levocetirizine (XYZAL) 5 MG tablet, Take 1 tablet (5 mg total) by mouth daily., Disp: , Rfl:  .  montelukast (SINGULAIR) 10 MG tablet, Take 1 tablet (10 mg total) by mouth at bedtime., Disp: 30 tablet, Rfl: 11 .  ondansetron (ZOFRAN-ODT) 4 MG disintegrating tablet, DIS ONE T PO Q 8 H PRN N FOR UP TO 7 DAYS, Disp: , Rfl: 0 .  PROAIR HFA 108 (90 Base) MCG/ACT inhaler, INHALE 2 PUFFS BY MOUTH INTO THE LUNGS EVERY 4 HOURS AS NEEDED FOR WHEEZING OR SHORTNESS OF BREATH, Disp: 8.5 g, Rfl: 2 .  promethazine (PHENERGAN) 25 MG tablet, TAKE ONE TABLET BY MOUTH EVERY 6 HOURS AS NEEDED FOR NAUSEA FOR UP TO 7 DAYS., Disp: , Rfl: 0 .  ranitidine (ZANTAC) 300 MG tablet, Take 1 tablet (300 mg total) by mouth at bedtime. (Patient taking differently: Take 300 mg by mouth daily as needed. ), Disp: 90 tablet, Rfl: 3 .  valACYclovir (VALTREX) 1000 MG tablet, Two pills by mouth at first sign of outbreak followed by two more pills twelve hours later, Disp: 4 tablet, Rfl: 2 .  DM-Doxylamine-Acetaminophen (NYQUIL COLD & FLU PO), Take by mouth Nightly., Disp: , Rfl:  .  omeprazole (PRILOSEC) 20 MG capsule, Take 20 mg by mouth as needed. , Disp: , Rfl:   Allergies  Allergen Reactions  .  Codeine   . Naproxen      Review of Systems  Constitutional: Negative for chills, fever and malaise/fatigue.  HENT: Negative for ear pain, sinus pain, sore throat and tinnitus.   Eyes: Negative for blurred vision.  Respiratory: Negative for cough and shortness of breath.   Cardiovascular: Negative for chest pain, palpitations and leg swelling.  Gastrointestinal: Positive for constipation (chronic). Negative for abdominal  pain, blood in stool, diarrhea, nausea and vomiting.  Genitourinary: Negative for dysuria and hematuria.  Musculoskeletal: Positive for joint pain (chronic). Negative for myalgias.  Skin: Negative for rash.  Neurological: Negative for dizziness, weakness and headaches.  Psychiatric/Behavioral: Negative for depression. The patient is nervous/anxious and has insomnia.      Objective  Vitals:   01/16/18 0827  BP: 118/72  Pulse: 78  Temp: 98 F (36.7 C)  SpO2: 96%  Weight: 268 lb 14.4 oz (122 kg)  Height: 5\' 5"  (1.651 m)    Body mass index is 44.75 kg/m.  Physical Exam Constitutional: Patient appears well-developed and well-nourished. No distress.  HENT: Head: Normocephalic and atraumatic. Ears: B TMs ok, no erythema left ear effusion; Nose: Nose normal. Mouth/Throat: Oropharynx is clear and moist. No oropharyngeal exudate.  Eyes: Conjunctivae and EOM are normal. Pupils are equal, round, and reactive to light. No scleral icterus.  Neck: Normal range of motion. Neck supple. No JVD present. No thyromegaly present.  Cardiovascular: Normal rate, regular rhythm and normal heart sounds.  murmur heard. No BLE edema. Pulmonary/Chest: Effort normal and breath sounds normal. No respiratory distress. Abdominal: Soft. Bowel sounds are normal, no distension. There is no tenderness. no masses Breast: deferred patient preference; risks benefits discussed.  FEMALE GENITALIA: deferred patient preference, had hysterectomy with cervix removed RECTAL: no rectal masses or hemorrhoids Musculoskeletal: Normal range of motion, no joint effusions. No gross deformities Neurological: he is alert and oriented to person, place, and time. No cranial nerve deficit. Coordination, balance, strength, speech and gait are normal.  Skin: Skin is warm and dry. No rash noted. No erythema.  Psychiatric: Patient has a normal mood and affect. behavior is normal. Judgment and thought content normal.   No results found  for this or any previous visit (from the past 2160 hour(s)).    PHQ2/9: Depression screen [facility] 2/9 01/16/2018 01/16/2018 08/23/2017 07/11/2017 01/15/2017  Decreased Interest 0 0 0 0 0  Down, Depressed, Hopeless 0 0 0 0 0  PHQ - 2 Score 0 0 0 0 0  Altered sleeping 3 - - - -  Tired, decreased energy 0 - - - -  Change in appetite 0 - - - -  Feeling bad or failure about yourself  0 - - - -  Trouble concentrating 0 - - - -  Moving slowly or fidgety/restless 0 - - - -  Suicidal thoughts 0 - - - -  PHQ-9 Score 3 - - - -     Fall Risk: Fall Risk  01/16/2018 08/23/2017 07/11/2017 01/15/2017 09/15/2016  Falls in the past year? No No No Yes No  Number falls in past yr: - - - 1 -  Injury with Fall? - - - Yes -     Functional Status Survey: Is the patient deaf or have difficulty hearing?: No Does the patient have difficulty seeing, even when wearing glasses/contacts?: Yes Does the patient have difficulty concentrating, remembering, or making decisions?: No Does the patient have difficulty walking or climbing stairs?: Yes Does the patient have difficulty dressing or bathing?: No Does  the patient have difficulty doing errands alone such as visiting a doctor's office or shopping?: No   Assessment & Plan  1. Annual physical exam FASTING LABS Discussed healthy living, losing weight, encouraged to take vitamin supplements prescribed by surgeon to ensure she is getting correct amount, will check some of these levels today as she has been taking centrum plus gummy vitamins instead.  - COMPLETE METABOLIC PANEL WITH GFR - Magnesium - CBC with Differential - Vitamin D (25 hydroxy) - Vitamin B12 - Lipid Profile - Fe+TIBC+Fer  2. Need for influenza vaccination - Flu Vaccine QUAD 6+ mos PF IM (Fluarix Quad PF)  3. Screening for breast cancer - MM Digital Screening; Future  4. 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   5. Status post gastric  surgery Taking centrum vitamin instead of prescribed vitamin  - COMPLETE METABOLIC PANEL WITH GFR - Magnesium - CBC with Differential - Vitamin D (25 hydroxy) - Vitamin B12 - Lipid Profile - Fe+TIBC+Fer   -USPSTF grade A and B recommendations reviewed with patient; age-appropriate recommendations, preventive care, screening tests, etc discussed and encouraged; healthy living encouraged; see AVS for patient education given to patient -Discussed importance of 150 minutes of physical activity weekly, eat two servings of fish weekly, eat one serving of tree nuts ( cashews, pistachios, pecans, almonds.Marland Kitchen) every other day, eat 6 servings of fruit/vegetables daily and drink plenty of water and avoid sweet beverages.

## 2018-01-17 LAB — CBC WITH DIFFERENTIAL/PLATELET
BASOS ABS: 40 {cells}/uL (ref 0–200)
Basophils Relative: 0.6 %
EOS ABS: 152 {cells}/uL (ref 15–500)
EOS PCT: 2.3 %
HEMATOCRIT: 42 % (ref 35.0–45.0)
Hemoglobin: 14.1 g/dL (ref 11.7–15.5)
LYMPHS ABS: 2204 {cells}/uL (ref 850–3900)
MCH: 27.3 pg (ref 27.0–33.0)
MCHC: 33.6 g/dL (ref 32.0–36.0)
MCV: 81.4 fL (ref 80.0–100.0)
MONOS PCT: 6.8 %
MPV: 13.1 fL — ABNORMAL HIGH (ref 7.5–12.5)
NEUTROS PCT: 56.9 %
Neutro Abs: 3755 cells/uL (ref 1500–7800)
Platelets: 219 10*3/uL (ref 140–400)
RBC: 5.16 10*6/uL — ABNORMAL HIGH (ref 3.80–5.10)
RDW: 14.4 % (ref 11.0–15.0)
Total Lymphocyte: 33.4 %
WBC mixed population: 449 cells/uL (ref 200–950)
WBC: 6.6 10*3/uL (ref 3.8–10.8)

## 2018-01-17 LAB — COMPLETE METABOLIC PANEL WITH GFR
AG Ratio: 2 (calc) (ref 1.0–2.5)
ALBUMIN MSPROF: 4 g/dL (ref 3.6–5.1)
ALKALINE PHOSPHATASE (APISO): 69 U/L (ref 33–115)
ALT: 16 U/L (ref 6–29)
AST: 16 U/L (ref 10–35)
BILIRUBIN TOTAL: 0.3 mg/dL (ref 0.2–1.2)
BUN: 14 mg/dL (ref 7–25)
CHLORIDE: 106 mmol/L (ref 98–110)
CO2: 24 mmol/L (ref 20–32)
Calcium: 9.3 mg/dL (ref 8.6–10.2)
Creat: 0.6 mg/dL (ref 0.50–1.10)
GFR, Est African American: 128 mL/min/{1.73_m2} (ref >=60)
GFR, Est Non African American: 110 mL/min/{1.73_m2} (ref >=60)
GLUCOSE: 89 mg/dL (ref 65–99)
Globulin: 2 g/dL (calc) (ref 1.9–3.7)
Potassium: 4 mmol/L (ref 3.5–5.3)
SODIUM: 139 mmol/L (ref 135–146)
Total Protein: 6 g/dL — ABNORMAL LOW (ref 6.1–8.1)

## 2018-01-17 LAB — LIPID PANEL
CHOL/HDL RATIO: 3.7 (calc) (ref <5.0)
Cholesterol: 142 mg/dL (ref <200)
HDL: 38 mg/dL — AB (ref >50)
LDL Cholesterol (Calc): 84 mg/dL (calc)
NON-HDL CHOLESTEROL (CALC): 104 mg/dL (ref <130)
Triglycerides: 104 mg/dL (ref <150)

## 2018-01-17 LAB — IRON,TIBC AND FERRITIN PANEL
%SAT: 25 % (calc) (ref 16–45)
Ferritin: 129 ng/mL (ref 16–232)
IRON: 63 ug/dL (ref 40–190)
TIBC: 252 ug/dL (ref 250–450)

## 2018-01-17 LAB — MAGNESIUM: Magnesium: 1.9 mg/dL (ref 1.5–2.5)

## 2018-01-17 LAB — VITAMIN B12: Vitamin B-12: 477 pg/mL (ref 200–1100)

## 2018-01-17 LAB — VITAMIN D 25 HYDROXY (VIT D DEFICIENCY, FRACTURES): Vit D, 25-Hydroxy: 44 ng/mL (ref 30–100)

## 2018-02-06 ENCOUNTER — Encounter: Payer: Self-pay | Admitting: Internal Medicine

## 2018-02-06 ENCOUNTER — Ambulatory Visit (INDEPENDENT_AMBULATORY_CARE_PROVIDER_SITE_OTHER): Payer: BLUE CROSS/BLUE SHIELD | Admitting: Internal Medicine

## 2018-02-06 VITALS — BP 110/60 | HR 80 | Resp 16 | Ht 65.0 in | Wt 280.0 lb

## 2018-02-06 DIAGNOSIS — G4733 Obstructive sleep apnea (adult) (pediatric): Secondary | ICD-10-CM | POA: Diagnosis not present

## 2018-02-06 DIAGNOSIS — R05 Cough: Secondary | ICD-10-CM | POA: Diagnosis not present

## 2018-02-06 DIAGNOSIS — R059 Cough, unspecified: Secondary | ICD-10-CM

## 2018-02-06 NOTE — Patient Instructions (Signed)
Continue antihistamine, singulair.  Use rescue inhaler as needed.  Use cpap every night.

## 2018-02-06 NOTE — Progress Notes (Signed)
[name]      Assessment and Plan:  The patient is a 47 year old female, referred for cough. She has a history of allergic asthma, and obstructive sleep apnea.  Asthma. -Doing significantly better since she has lost weight.  She can remain off of maintenance inhaler, continue albuterol MDI as needed, continue Singulair and antihistamine.  Allergic rhinitis with cough..  -Does not like taking nasal steroid, can continue antihistamine.  Gerd.  -Continue PPI.   Obstructive sleep apnea. -Doing well with CPAP, continue CPAP nightly.  Return in about 1 year (around 02/07/2019).    Date: 02/06/2018  MRN# [id number] Jamie Hardin Jan 28, 1971    Jamie Hardin is a 47 y.o. old female seen in consultation for chief complaint of: dyspnea and cough.      HPI:  The patient is a 47 year old female with history of morbid obesity, obstructive sleep apnea, asthma, GERD.  At last visit she was having severe intractable cough and was seen as a preoperative evaluation for bariatric surgery. She was asked to continue prednisone, Advair, Singulair, antihistamine.  She was started on Tessalon, Robitussin, narcotic cough syrup nightly.  She has lost 65 pounds and feels and feels that her breathing is improved, the cough is also better, though not done. She continues to have sinus drainage. She does not like flonase, she takes an antihistamine. She continues singulair, she is off all inhalers.  She has been doing well with CPAP, using it every night, more awake during the day.   She has 5 indoor animals which include 3 dogs and a Chinchilla, plus chickens and outdoor cats It was thought that allergic rhinitis and reflux may be a triggering, she was therefore asked to start on Nasacort and Prevacid, she was also encouraged to use CPAP nightly.  She is still using  CPAP "religiously" every night for the entire night.  Feels that it is benefiting her, she is no  longer sleepy during the day.  Images personally reviewed. Chest x-ray 09/15/16; unremarkable, lungs. CBC 01/14/16; eosinophil count equals 100.   Social Hx:   Social History   Tobacco Use  . Smoking status: Former Smoker    Packs/day: 1.00    Years: 20.00    Pack years: 20.00    Types: Cigarettes    Last attempt to quit: 04/04/2003    Years since quitting: 14.8  . Smokeless tobacco: Never Used  Substance Use Topics  . Alcohol use: Yes    Comment: Very Rare  . Drug use: No   Medication:    Current Outpatient Medications:  .  albuterol (ACCUNEB) 0.63 MG/3ML nebulizer solution, Take 3 mLs (0.63 mg total) by nebulization every 4 (four) hours as needed for wheezing., Disp: 75 mL, Rfl: 3 .  baclofen (LIORESAL) 10 MG tablet, Take 10 mg by mouth 2 (two) times daily as needed., Disp: , Rfl:  .  clonazePAM (KLONOPIN) 0.5 MG tablet, Take 0.5 mg by mouth 2 (two) times daily as needed., Disp: , Rfl: 0 .  DM-Doxylamine-Acetaminophen (NYQUIL COLD & FLU PO), Take by mouth Nightly., Disp: , Rfl:  .  fluticasone-salmeterol (ADVAIR HFA) 230-21 MCG/ACT inhaler, Inhale 2 puffs into the lungs 2 (two) times daily. 2 puffs twice daily. Rinse mouth after use., Disp: 1 Inhaler, Rfl: 12 .  ibuprofen (ADVIL,MOTRIN) 800 MG tablet, Take 800 mg by mouth as needed. , Disp: , Rfl:  .  levocetirizine (XYZAL) 5 MG tablet, Take 1 tablet (5 mg total) by mouth daily.,  Disp: , Rfl:  .  montelukast (SINGULAIR) 10 MG tablet, Take 1 tablet (10 mg total) by mouth at bedtime., Disp: 30 tablet, Rfl: 11 .  omeprazole (PRILOSEC) 20 MG capsule, Take 20 mg by mouth as needed. , Disp: , Rfl:  .  ondansetron (ZOFRAN-ODT) 4 MG disintegrating tablet, DIS ONE T PO Q 8 H PRN N FOR UP TO 7 DAYS, Disp: , Rfl: 0 .  PROAIR HFA 108 (90 Base) MCG/ACT inhaler, INHALE 2 PUFFS BY MOUTH INTO THE LUNGS EVERY 4 HOURS AS NEEDED FOR WHEEZING OR SHORTNESS OF BREATH, Disp: 8.5 g, Rfl: 2 .  promethazine (PHENERGAN) 25 MG tablet, TAKE ONE TABLET BY  MOUTH EVERY 6 HOURS AS NEEDED FOR NAUSEA FOR UP TO 7 DAYS., Disp: , Rfl: 0 .  ranitidine (ZANTAC) 300 MG tablet, Take 1 tablet (300 mg total) by mouth at bedtime. (Patient taking differently: Take 300 mg by mouth daily as needed. ), Disp: 90 tablet, Rfl: 3 .  valACYclovir (VALTREX) 1000 MG tablet, Two pills by mouth at first sign of outbreak followed by two more pills twelve hours later, Disp: 4 tablet, Rfl: 2   Allergies:  Codeine and Naproxen  Review of Systems:  Constitutional: Feels well. Cardiovascular: Denies chest pain, exertional chest pain.  Pulmonary: Denies hemoptysis, pleuritic chest pain.   The remainder of systems were reviewed and were found to be negative other than what is documented in the HPI.    Physical Examination:   VS: BP 110/60 (BP Location: Left Arm, Cuff Size: Large)   Pulse 80   Resp 16   Ht 5\' 5"  (1.651 m)   Wt 280 lb (127 kg)   SpO2 96%   BMI 46.59 kg/m   General Appearance: No distress  Neuro:without focal findings, mental status, speech normal, alert and oriented HEENT: PERRLA, EOM intact Pulmonary: No wheezing, No rales  CardiovascularNormal S1,S2.  No m/r/g.  Abdomen: Benign, Soft, non-tender, No masses Renal:  No costovertebral tenderness  GU:  No performed at this time. Endoc: No evident thyromegaly, no signs of acromegaly or Cushing features Skin:   warm, no rashes, no ecchymosis  Extremities: normal, no cyanosis, clubbing.      LABORATORY PANEL:   CBC No results for input(s): WBC, HGB, HCT, PLT in the last 168 hours. ------------------------------------------------------------------------------------------------------------------  Chemistries  No results for input(s): NA, K, CL, CO2, GLUCOSE, BUN, CREATININE, CALCIUM, MG, AST, ALT, ALKPHOS, BILITOT in the last 168 hours.  Invalid input(s): GFRCGP ------------------------------------------------------------------------------------------------------------------  Cardiac  Enzymes No results for input(s): TROPONINI in the last 168 hours. ------------------------------------------------------------  RADIOLOGY:  No results found.     Thank  you for the consultation and for allowing [facility] to assist in the care of your patient. Our recommendations are noted above.  Please contact us if we can be of further service.  Marda Stalker, M.D., F.C.C.P.  Board Certified in Internal Medicine, Pulmonary Medicine, [place], and Sleep Medicine.  [facility] Office Number: [phone]   02/06/2018

## 2018-02-13 ENCOUNTER — Other Ambulatory Visit: Payer: Self-pay | Admitting: Family Medicine

## 2018-02-15 DIAGNOSIS — K219 Gastro-esophageal reflux disease without esophagitis: Secondary | ICD-10-CM | POA: Diagnosis not present

## 2018-02-15 DIAGNOSIS — Z5321 Procedure and treatment not carried out due to patient leaving prior to being seen by health care provider: Secondary | ICD-10-CM | POA: Diagnosis not present

## 2018-02-15 DIAGNOSIS — Z87891 Personal history of nicotine dependence: Secondary | ICD-10-CM | POA: Diagnosis not present

## 2018-02-15 DIAGNOSIS — J45909 Unspecified asthma, uncomplicated: Secondary | ICD-10-CM | POA: Diagnosis not present

## 2018-02-15 DIAGNOSIS — I491 Atrial premature depolarization: Secondary | ICD-10-CM | POA: Diagnosis not present

## 2018-02-15 DIAGNOSIS — Z79899 Other long term (current) drug therapy: Secondary | ICD-10-CM | POA: Diagnosis not present

## 2018-02-15 DIAGNOSIS — K8020 Calculus of gallbladder without cholecystitis without obstruction: Secondary | ICD-10-CM | POA: Diagnosis not present

## 2018-02-15 DIAGNOSIS — Z6841 Body Mass Index (BMI) 40.0 and over, adult: Secondary | ICD-10-CM | POA: Diagnosis not present

## 2018-02-15 DIAGNOSIS — G4730 Sleep apnea, unspecified: Secondary | ICD-10-CM | POA: Diagnosis not present

## 2018-02-15 DIAGNOSIS — M797 Fibromyalgia: Secondary | ICD-10-CM | POA: Diagnosis not present

## 2018-02-15 DIAGNOSIS — Z9884 Bariatric surgery status: Secondary | ICD-10-CM | POA: Diagnosis not present

## 2018-02-15 DIAGNOSIS — R1011 Right upper quadrant pain: Secondary | ICD-10-CM | POA: Diagnosis not present

## 2018-02-16 DIAGNOSIS — K660 Peritoneal adhesions (postprocedural) (postinfection): Secondary | ICD-10-CM | POA: Diagnosis not present

## 2018-02-16 DIAGNOSIS — K8020 Calculus of gallbladder without cholecystitis without obstruction: Secondary | ICD-10-CM | POA: Diagnosis not present

## 2018-02-16 DIAGNOSIS — Z6841 Body Mass Index (BMI) 40.0 and over, adult: Secondary | ICD-10-CM | POA: Diagnosis not present

## 2018-02-17 DIAGNOSIS — R1011 Right upper quadrant pain: Secondary | ICD-10-CM | POA: Diagnosis not present

## 2018-02-20 DIAGNOSIS — K5900 Constipation, unspecified: Secondary | ICD-10-CM | POA: Diagnosis not present

## 2018-02-20 DIAGNOSIS — R42 Dizziness and giddiness: Secondary | ICD-10-CM | POA: Diagnosis not present

## 2018-02-20 DIAGNOSIS — Z9049 Acquired absence of other specified parts of digestive tract: Secondary | ICD-10-CM | POA: Diagnosis not present

## 2018-02-20 DIAGNOSIS — Z6841 Body Mass Index (BMI) 40.0 and over, adult: Secondary | ICD-10-CM | POA: Diagnosis not present

## 2018-02-20 DIAGNOSIS — Z9884 Bariatric surgery status: Secondary | ICD-10-CM | POA: Diagnosis not present

## 2018-02-20 DIAGNOSIS — K912 Postsurgical malabsorption, not elsewhere classified: Secondary | ICD-10-CM | POA: Diagnosis not present

## 2018-02-20 DIAGNOSIS — Z48815 Encounter for surgical aftercare following surgery on the digestive system: Secondary | ICD-10-CM | POA: Diagnosis not present

## 2018-02-20 DIAGNOSIS — Z713 Dietary counseling and surveillance: Secondary | ICD-10-CM | POA: Diagnosis not present

## 2018-02-20 DIAGNOSIS — Z87891 Personal history of nicotine dependence: Secondary | ICD-10-CM | POA: Diagnosis not present

## 2018-02-22 DIAGNOSIS — Z9049 Acquired absence of other specified parts of digestive tract: Secondary | ICD-10-CM | POA: Diagnosis not present

## 2018-02-22 DIAGNOSIS — R1011 Right upper quadrant pain: Secondary | ICD-10-CM | POA: Diagnosis not present

## 2018-03-14 DIAGNOSIS — J019 Acute sinusitis, unspecified: Secondary | ICD-10-CM | POA: Diagnosis not present

## 2018-03-14 DIAGNOSIS — G4733 Obstructive sleep apnea (adult) (pediatric): Secondary | ICD-10-CM | POA: Diagnosis not present

## 2018-03-18 DIAGNOSIS — Z09 Encounter for follow-up examination after completed treatment for conditions other than malignant neoplasm: Secondary | ICD-10-CM | POA: Diagnosis not present

## 2018-03-21 DIAGNOSIS — F331 Major depressive disorder, recurrent, moderate: Secondary | ICD-10-CM | POA: Diagnosis not present

## 2018-03-21 DIAGNOSIS — F411 Generalized anxiety disorder: Secondary | ICD-10-CM | POA: Diagnosis not present

## 2018-04-19 DIAGNOSIS — H10011 Acute follicular conjunctivitis, right eye: Secondary | ICD-10-CM | POA: Diagnosis not present

## 2018-05-21 DIAGNOSIS — M9901 Segmental and somatic dysfunction of cervical region: Secondary | ICD-10-CM | POA: Diagnosis not present

## 2018-05-21 DIAGNOSIS — R51 Headache: Secondary | ICD-10-CM | POA: Diagnosis not present

## 2018-05-21 DIAGNOSIS — M5414 Radiculopathy, thoracic region: Secondary | ICD-10-CM | POA: Diagnosis not present

## 2018-05-21 DIAGNOSIS — M9902 Segmental and somatic dysfunction of thoracic region: Secondary | ICD-10-CM | POA: Diagnosis not present

## 2018-05-22 DIAGNOSIS — M9901 Segmental and somatic dysfunction of cervical region: Secondary | ICD-10-CM | POA: Diagnosis not present

## 2018-05-22 DIAGNOSIS — M9902 Segmental and somatic dysfunction of thoracic region: Secondary | ICD-10-CM | POA: Diagnosis not present

## 2018-05-22 DIAGNOSIS — M5414 Radiculopathy, thoracic region: Secondary | ICD-10-CM | POA: Diagnosis not present

## 2018-05-22 DIAGNOSIS — R51 Headache: Secondary | ICD-10-CM | POA: Diagnosis not present

## 2018-05-23 ENCOUNTER — Telehealth: Payer: Self-pay

## 2018-05-23 NOTE — Telephone Encounter (Signed)
Copied from [place] [id number]. Topic: General - Other >> May 23, 2018 11:22 AM Alanda Slim E wrote: Reason for CRM: Pt received a $250 bill from [facility] for blood work from 10.16.2019 visit. Insurance wouldn't cover it due to the diagnoses/ please advise

## 2018-05-23 NOTE — Telephone Encounter (Signed)
Did not receive any information from her insurance company. Can you find out which labs were not covered. You can additionally add the following ICD 10 codes: K90.9; D64.9

## 2018-05-23 NOTE — Telephone Encounter (Signed)
I don't need to be involved with this note, as I did not see her or order the labs

## 2018-05-23 NOTE — Telephone Encounter (Signed)
Patient was asked to fax over the papers she received from [facility] so we can resubmit it with new ICD 10 codes.  Our fax number was given and she stated that she was going to fax it right away.

## 2018-05-27 DIAGNOSIS — R51 Headache: Secondary | ICD-10-CM | POA: Diagnosis not present

## 2018-05-27 DIAGNOSIS — M5414 Radiculopathy, thoracic region: Secondary | ICD-10-CM | POA: Diagnosis not present

## 2018-05-27 DIAGNOSIS — M9901 Segmental and somatic dysfunction of cervical region: Secondary | ICD-10-CM | POA: Diagnosis not present

## 2018-05-27 DIAGNOSIS — M9902 Segmental and somatic dysfunction of thoracic region: Secondary | ICD-10-CM | POA: Diagnosis not present

## 2018-05-29 DIAGNOSIS — M5030 Other cervical disc degeneration, unspecified cervical region: Secondary | ICD-10-CM | POA: Diagnosis not present

## 2018-06-07 DIAGNOSIS — K912 Postsurgical malabsorption, not elsewhere classified: Secondary | ICD-10-CM | POA: Diagnosis not present

## 2018-06-07 DIAGNOSIS — Z87891 Personal history of nicotine dependence: Secondary | ICD-10-CM | POA: Diagnosis not present

## 2018-06-07 DIAGNOSIS — Z713 Dietary counseling and surveillance: Secondary | ICD-10-CM | POA: Diagnosis not present

## 2018-06-07 DIAGNOSIS — Z9884 Bariatric surgery status: Secondary | ICD-10-CM | POA: Diagnosis not present

## 2018-06-07 DIAGNOSIS — Z6841 Body Mass Index (BMI) 40.0 and over, adult: Secondary | ICD-10-CM | POA: Diagnosis not present

## 2018-06-07 DIAGNOSIS — Z9049 Acquired absence of other specified parts of digestive tract: Secondary | ICD-10-CM | POA: Diagnosis not present

## 2018-06-07 DIAGNOSIS — Z48815 Encounter for surgical aftercare following surgery on the digestive system: Secondary | ICD-10-CM | POA: Diagnosis not present

## 2018-06-07 DIAGNOSIS — Z79899 Other long term (current) drug therapy: Secondary | ICD-10-CM | POA: Diagnosis not present

## 2018-06-07 IMAGING — CR DG CHEST 2V
1 series · 2 of 2 positions shown · non-contrast
Comparison: 09/15/2016

CLINICAL DATA: Productive cough and shortness of Breath

EXAM:
CHEST - 2 VIEW

[Series 1: dg chest 2 view · 0.14mm/px · 2 of 2 slices shown]
[im 1/2]
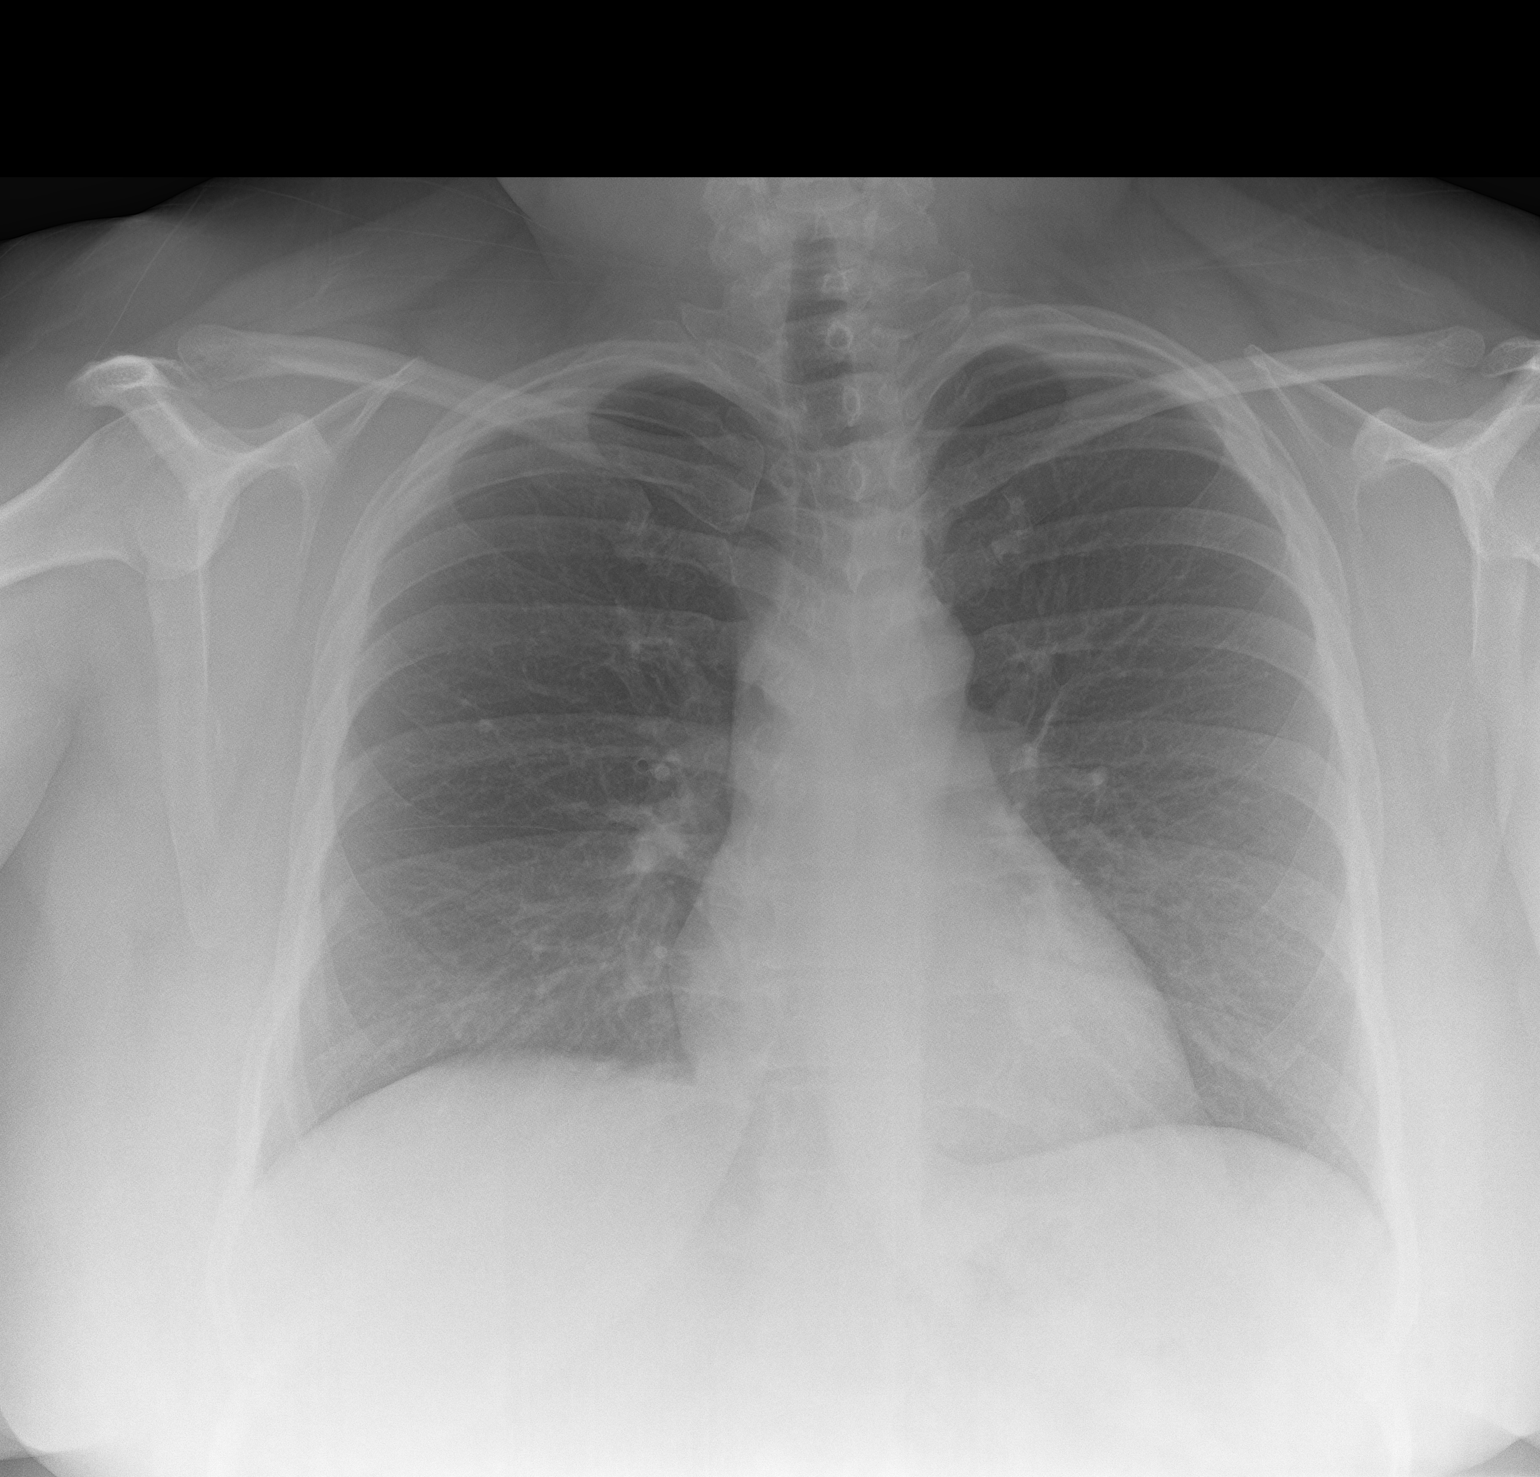
[im 2/2]
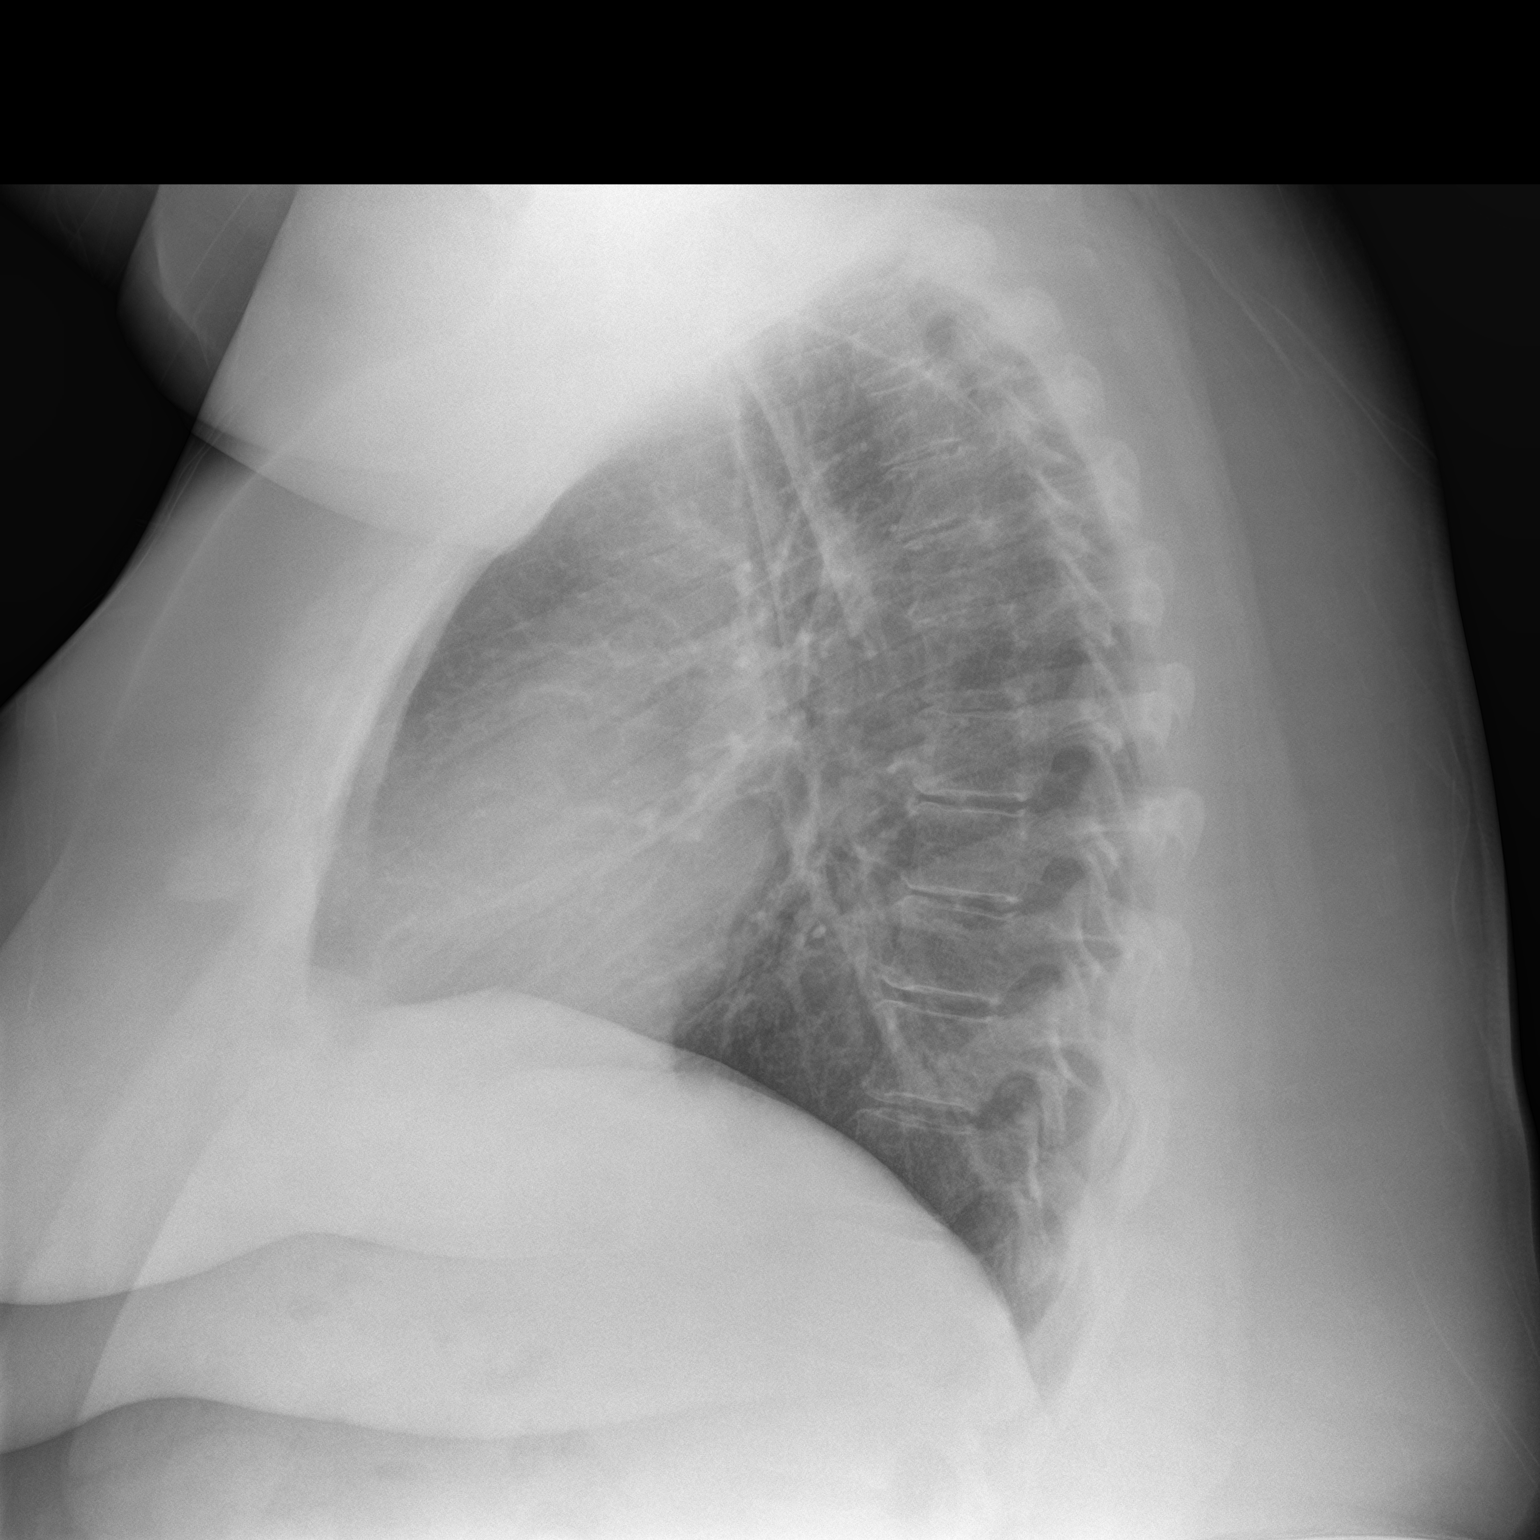

[2 of 2 positions shown; findings below may reference images not displayed]

FINDINGS: The heart size and mediastinal contours are within normal limits.
Both lungs are clear. The visualized skeletal structures are
unremarkable.
IMPRESSION: No active cardiopulmonary disease.

## 2018-06-11 DIAGNOSIS — Z9884 Bariatric surgery status: Secondary | ICD-10-CM | POA: Diagnosis not present

## 2018-06-11 DIAGNOSIS — Z713 Dietary counseling and surveillance: Secondary | ICD-10-CM | POA: Diagnosis not present

## 2018-06-11 DIAGNOSIS — K912 Postsurgical malabsorption, not elsewhere classified: Secondary | ICD-10-CM | POA: Diagnosis not present

## 2018-06-11 DIAGNOSIS — Z6841 Body Mass Index (BMI) 40.0 and over, adult: Secondary | ICD-10-CM | POA: Diagnosis not present

## 2018-06-11 DIAGNOSIS — Z79899 Other long term (current) drug therapy: Secondary | ICD-10-CM | POA: Diagnosis not present

## 2018-06-12 DIAGNOSIS — G4733 Obstructive sleep apnea (adult) (pediatric): Secondary | ICD-10-CM | POA: Diagnosis not present

## 2018-07-08 DIAGNOSIS — R1011 Right upper quadrant pain: Secondary | ICD-10-CM | POA: Diagnosis not present

## 2018-07-08 DIAGNOSIS — K219 Gastro-esophageal reflux disease without esophagitis: Secondary | ICD-10-CM | POA: Diagnosis not present

## 2018-07-08 DIAGNOSIS — Z9884 Bariatric surgery status: Secondary | ICD-10-CM | POA: Diagnosis not present

## 2018-07-08 DIAGNOSIS — Z9049 Acquired absence of other specified parts of digestive tract: Secondary | ICD-10-CM | POA: Diagnosis not present

## 2018-07-22 DIAGNOSIS — M545 Low back pain: Secondary | ICD-10-CM | POA: Diagnosis not present

## 2018-07-22 DIAGNOSIS — M62830 Muscle spasm of back: Secondary | ICD-10-CM | POA: Diagnosis not present

## 2018-07-22 DIAGNOSIS — M9903 Segmental and somatic dysfunction of lumbar region: Secondary | ICD-10-CM | POA: Diagnosis not present

## 2018-07-22 DIAGNOSIS — M9901 Segmental and somatic dysfunction of cervical region: Secondary | ICD-10-CM | POA: Diagnosis not present

## 2018-07-25 ENCOUNTER — Other Ambulatory Visit: Payer: Self-pay

## 2018-07-25 ENCOUNTER — Encounter: Payer: Self-pay | Admitting: Family Medicine

## 2018-07-25 ENCOUNTER — Ambulatory Visit (INDEPENDENT_AMBULATORY_CARE_PROVIDER_SITE_OTHER): Payer: BLUE CROSS/BLUE SHIELD | Admitting: Family Medicine

## 2018-07-25 VITALS — Wt 244.0 lb

## 2018-07-25 DIAGNOSIS — J4520 Mild intermittent asthma, uncomplicated: Secondary | ICD-10-CM | POA: Diagnosis not present

## 2018-07-25 DIAGNOSIS — B351 Tinea unguium: Secondary | ICD-10-CM | POA: Diagnosis not present

## 2018-07-25 DIAGNOSIS — R232 Flushing: Secondary | ICD-10-CM | POA: Diagnosis not present

## 2018-07-25 MED ORDER — VENLAFAXINE HCL ER 37.5 MG PO CP24: 37.5000 mg | ORAL_CAPSULE | Freq: Every day | ORAL | 0 refills | Status: DC

## 2018-07-25 MED ORDER — IBUPROFEN 200 MG PO TABS: 200.0000 mg | ORAL_TABLET | Freq: Four times a day (QID) | ORAL | Status: AC | PRN

## 2018-07-25 NOTE — Progress Notes (Signed)
Wt 244 lb (110.7 kg)   BMI 40.60 kg/m    Subjective:    Patient ID: Jamie Hardin, female    DOB: October 02, 1970, 48 y.o.   MRN: [id number]  HPI: Jamie Hardin is a 48 y.o. female  Chief Complaint  Patient presents with  . Hot Flashes  . Nail Problem    fungus from pedicure    HPI Virtual Visit via Telephone/Video Note  Due to national recommendations of social distancing due to the COVID-19 pandemic, an audio/video telehealth visit is felt to be most appropriate for this patient at this time.    I tried to connect with the patient via:  D[employer] video Call started: 9:26 am; no answer; tried work, that is for "[employer]", tried other number again, finally tried regular phone, reached voicemail, left message 9:32 I asked her to plesae call the office with updated numbers Work number is incorrect Chart review during time waiting for connection I tried to connect via telephone at 9:41 am again; left another message that I am trying to connect with her for our visit Please call the office back with another number to time for our visit ---------------------------------------------  Call started: 9:45 am I verified that I am speaking with the correct person using two identifiers.  Provider location: home office with door closed, earphones / headset on Patient location: living room alone Additional participants: no one  I discussed the limitations, risks, and privacy concerns of performing an evaluation and management service by telephone. I discussed the availability of in-person appointments. I explained that he/she may be responsible for charges related to this service. The patient expressed understanding and agreed to proceed.  Final visit call started: 9;45 am Call ended: 10:02 am Total length of call: 17 minutes 48 seconds  She had a telemedicine visit on 07/08/2018 with Dr. Veverly Fells, bariatrics  Hot flashes; she is having hot flashes; started about 5 weeks ago; may be  after she eats; within 20-45 minutes, gets extreme hot flashes; she is going to see her weight loss clinic about this; she bumped her carbs up a little; they sent her a glucometer; FSBS 85-135 range; it does correspond to meals; thinks it is a weight loss surgery thing; there is estrogen stored in fat cells; she has lost 95 pounds; weight today was 244 pounds; it was worth it; she feels so much better; she thinks either surgery or estrogen thing; she had rapid weight loss; they suggested f/u with regular doctor; occasional headaches, but has neck problem; neck and head hurt but she cannot get back to her ortho; MRI scheduled; 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; hot flashes come from deep inside; no diarhrea; she goes to [place]; only takes cl onazepam PRN; takes 400 mg ibuprofen and is able to do so because of sleeve  Nail problem; before the Coronavirus, went to get a pedicure; mother got bad toenail infections; patient's big toenails look yellow and lifting at the ends, nail separation; she tried H2O2, OTC stuff, vicks BID  Asthma I asked if she knew about singulair black box warning; she has had some irritability but thinks it is from weight loss surgery, so many changes lately; she does not think the singulair helps that much; doing better this year on the xyzal  Depression screen [facility] 2/9 01/16/2018 01/16/2018 08/23/2017 07/11/2017 01/15/2017  Decreased Interest 0 0 0 0 0  Down, Depressed, Hopeless 0 0 0 0 0  PHQ - 2 Score  0 0 0 0 0  Altered sleeping 3 - - - -  Tired, decreased energy 0 - - - -  Change in appetite 0 - - - -  Feeling bad or failure about yourself  0 - - - -  Trouble concentrating 0 - - - -  Moving slowly or fidgety/restless 0 - - - -  Suicidal thoughts 0 - - - -  PHQ-9 Score 3 - - - -   Fall Risk  07/25/2018 01/16/2018 08/23/2017 07/11/2017 01/15/2017  Falls in the past year? 0 No No No Yes  Number falls in past yr: 0 - - - 1  Injury with  Fall? - - - - Yes    Relevant past medical, surgical, family and social history reviewed Past Medical History:  Diagnosis Date  . Allergy   . Asthma   . Chronic sinusitis   . Dysthymic disorder   . GERD (gastroesophageal reflux disease)   . 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 Obesity   . Ovarian endometriosis   . Peripheral neuropathy   . 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 Torn meniscus    Past Surgical History:  Procedure Laterality Date  . ABDOMINAL HYSTERECTOMY  2007   complete due to endometriosis  . CHOLECYSTECTOMY  01/2018  . 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 NASAL SINUS SURGERY  1996 and 1999   x 2   Family History  Problem Relation Age of Onset  . Fibromyalgia Mother   . Diabetes Mother   . Cancer Father        prostate  . Diabetes Maternal Aunt   . Diabetes Paternal Aunt   . Scleroderma Maternal Grandmother   . Heart disease Maternal Grandfather   . Hypertension Neg Hx   . Hyperlipidemia Neg Hx   . Stroke Neg Hx   . COPD Neg Hx    Social History   Tobacco Use  . Smoking status: Former Smoker    Packs/day: 1.00    Years: 20.00    Pack years: 20.00    Types: Cigarettes    Last attempt to quit: 04/04/2003    Years since quitting: 15.3  . Smokeless tobacco: Never Used  Substance Use Topics  . Alcohol use: Yes    Comment: Very Rare  . Drug use: No     [facility]/25/[facility] in [facility]  AUDIT-C Score  0      Interim medical history since last visit reviewed. Allergies and medications reviewed  Review of Systems Per HPI unless specifically indicated above     Objective:    Wt 244 lb (110.7 kg)   BMI 40.60 kg/m   Wt Readings from Last 3 Encounters:  07/25/18 244 lb (110.7 kg)  02/06/18 280 lb (127 kg)  01/16/18 268 lb 14.4 oz (122 kg)    Physical Exam Pulmonary:     Effort: No respiratory distress.  Neurological:     Mental Status: She is alert.     Cranial  Nerves: No dysarthria.  Psychiatric:        Speech: Speech is not rapid and pressured, delayed or slurred.        Assessment & Plan:   Problem List Items Addressed This Visit      Cardiovascular and Mediastinum   Hot flashes - Primary    May be release of estrogen or after surgery; trial of effexor; important to take  daily; let me know response in a few weeks        Respiratory   Asthma (Chronic)    She does not think singulair helps at all; she sees Dr. Ashby Dawes so I'll suggest she talk to him about black box warning and if worth continuing for her asthma        Musculoskeletal and Integument   Onychomycosis    vicks vaporub daily for 6-9 months, use separate nail trimmers to avoid spread to other nails; just wait for it to grow out; Rx available if desired, patient just has to call me; avoid pedicures          Follow up plan: Return if symptoms worsen or fail to improve.  Meds ordered this encounter  Medications  . ibuprofen (ADVIL) 200 MG tablet    Sig: Take 1-2 tablets (200-400 mg total) by mouth every 6 (six) hours as needed.  . venlafaxine XR (EFFEXOR XR) 37.5 MG 24 hr capsule    Sig: Take 1 capsule (37.5 mg total) by mouth daily with breakfast.    Dispense:  30 capsule    Refill:  0    No orders of the defined types were placed in this encounter.

## 2018-07-25 NOTE — Assessment & Plan Note (Signed)
vicks vaporub daily for 6-9 months, use separate nail trimmers to avoid spread to other nails; just wait for it to grow out; Rx available if desired, patient just has to call me; avoid pedicures

## 2018-07-25 NOTE — Assessment & Plan Note (Signed)
May be release of estrogen or after surgery; trial of effexor; important to take daily; let me know response in a few weeks

## 2018-07-25 NOTE — Assessment & Plan Note (Signed)
She does not think singulair helps at all; she sees Dr. Ashby Dawes so I'll suggest she talk to him about black box warning and if worth continuing for her asthma

## 2018-07-26 DIAGNOSIS — M9901 Segmental and somatic dysfunction of cervical region: Secondary | ICD-10-CM | POA: Diagnosis not present

## 2018-07-26 DIAGNOSIS — M545 Low back pain: Secondary | ICD-10-CM | POA: Diagnosis not present

## 2018-07-26 DIAGNOSIS — M9903 Segmental and somatic dysfunction of lumbar region: Secondary | ICD-10-CM | POA: Diagnosis not present

## 2018-07-26 DIAGNOSIS — M62830 Muscle spasm of back: Secondary | ICD-10-CM | POA: Diagnosis not present

## 2018-07-29 DIAGNOSIS — M62830 Muscle spasm of back: Secondary | ICD-10-CM | POA: Diagnosis not present

## 2018-07-29 DIAGNOSIS — M9901 Segmental and somatic dysfunction of cervical region: Secondary | ICD-10-CM | POA: Diagnosis not present

## 2018-07-29 DIAGNOSIS — M545 Low back pain: Secondary | ICD-10-CM | POA: Diagnosis not present

## 2018-07-29 DIAGNOSIS — M9903 Segmental and somatic dysfunction of lumbar region: Secondary | ICD-10-CM | POA: Diagnosis not present

## 2018-08-02 DIAGNOSIS — M9903 Segmental and somatic dysfunction of lumbar region: Secondary | ICD-10-CM | POA: Diagnosis not present

## 2018-08-02 DIAGNOSIS — M9901 Segmental and somatic dysfunction of cervical region: Secondary | ICD-10-CM | POA: Diagnosis not present

## 2018-08-02 DIAGNOSIS — M62830 Muscle spasm of back: Secondary | ICD-10-CM | POA: Diagnosis not present

## 2018-08-02 DIAGNOSIS — M545 Low back pain: Secondary | ICD-10-CM | POA: Diagnosis not present

## 2018-08-07 DIAGNOSIS — M62830 Muscle spasm of back: Secondary | ICD-10-CM | POA: Diagnosis not present

## 2018-08-07 DIAGNOSIS — M9901 Segmental and somatic dysfunction of cervical region: Secondary | ICD-10-CM | POA: Diagnosis not present

## 2018-08-07 DIAGNOSIS — M9903 Segmental and somatic dysfunction of lumbar region: Secondary | ICD-10-CM | POA: Diagnosis not present

## 2018-08-07 DIAGNOSIS — M545 Low back pain: Secondary | ICD-10-CM | POA: Diagnosis not present

## 2018-08-09 DIAGNOSIS — M9901 Segmental and somatic dysfunction of cervical region: Secondary | ICD-10-CM | POA: Diagnosis not present

## 2018-08-09 DIAGNOSIS — M62830 Muscle spasm of back: Secondary | ICD-10-CM | POA: Diagnosis not present

## 2018-08-09 DIAGNOSIS — M545 Low back pain: Secondary | ICD-10-CM | POA: Diagnosis not present

## 2018-08-09 DIAGNOSIS — M9903 Segmental and somatic dysfunction of lumbar region: Secondary | ICD-10-CM | POA: Diagnosis not present

## 2018-08-12 DIAGNOSIS — M62830 Muscle spasm of back: Secondary | ICD-10-CM | POA: Diagnosis not present

## 2018-08-12 DIAGNOSIS — M9903 Segmental and somatic dysfunction of lumbar region: Secondary | ICD-10-CM | POA: Diagnosis not present

## 2018-08-12 DIAGNOSIS — M9901 Segmental and somatic dysfunction of cervical region: Secondary | ICD-10-CM | POA: Diagnosis not present

## 2018-08-12 DIAGNOSIS — M545 Low back pain: Secondary | ICD-10-CM | POA: Diagnosis not present

## 2018-08-14 DIAGNOSIS — M545 Low back pain: Secondary | ICD-10-CM | POA: Diagnosis not present

## 2018-08-14 DIAGNOSIS — M9901 Segmental and somatic dysfunction of cervical region: Secondary | ICD-10-CM | POA: Diagnosis not present

## 2018-08-14 DIAGNOSIS — M9903 Segmental and somatic dysfunction of lumbar region: Secondary | ICD-10-CM | POA: Diagnosis not present

## 2018-08-14 DIAGNOSIS — M62830 Muscle spasm of back: Secondary | ICD-10-CM | POA: Diagnosis not present

## 2018-08-16 DIAGNOSIS — M5030 Other cervical disc degeneration, unspecified cervical region: Secondary | ICD-10-CM | POA: Diagnosis not present

## 2018-08-19 DIAGNOSIS — M545 Low back pain: Secondary | ICD-10-CM | POA: Diagnosis not present

## 2018-08-19 DIAGNOSIS — M9903 Segmental and somatic dysfunction of lumbar region: Secondary | ICD-10-CM | POA: Diagnosis not present

## 2018-08-19 DIAGNOSIS — M62830 Muscle spasm of back: Secondary | ICD-10-CM | POA: Diagnosis not present

## 2018-08-19 DIAGNOSIS — M5030 Other cervical disc degeneration, unspecified cervical region: Secondary | ICD-10-CM | POA: Diagnosis not present

## 2018-08-19 DIAGNOSIS — M9901 Segmental and somatic dysfunction of cervical region: Secondary | ICD-10-CM | POA: Diagnosis not present

## 2018-08-21 DIAGNOSIS — M62830 Muscle spasm of back: Secondary | ICD-10-CM | POA: Diagnosis not present

## 2018-08-21 DIAGNOSIS — M545 Low back pain: Secondary | ICD-10-CM | POA: Diagnosis not present

## 2018-08-21 DIAGNOSIS — M9903 Segmental and somatic dysfunction of lumbar region: Secondary | ICD-10-CM | POA: Diagnosis not present

## 2018-08-21 DIAGNOSIS — M9901 Segmental and somatic dysfunction of cervical region: Secondary | ICD-10-CM | POA: Diagnosis not present

## 2018-08-28 DIAGNOSIS — M5412 Radiculopathy, cervical region: Secondary | ICD-10-CM | POA: Diagnosis not present

## 2018-08-28 DIAGNOSIS — G5621 Lesion of ulnar nerve, right upper limb: Secondary | ICD-10-CM | POA: Diagnosis not present

## 2018-08-29 ENCOUNTER — Telehealth: Payer: Self-pay

## 2018-08-29 NOTE — Telephone Encounter (Signed)
Left message for patient to call back. Received refill request from [facility] for Advair, however last office visit with Dr. Ashby Dawes states she can stop her maintenance inhalers. Wanted to confirm that she is still using this.

## 2018-08-30 NOTE — Telephone Encounter (Signed)
Spoke to patient, she did not mean to request the refill of Advair.  Also she wanted to let us know that her father, Neva Seat passed away. Nothing further needed at this time.

## 2018-08-30 NOTE — Telephone Encounter (Signed)
Left message for pt

## 2018-09-02 NOTE — Telephone Encounter (Signed)
Called [facility] and requested that Rx for Advair 230 be canceled, as pt is not taking this medication any longer.

## 2018-09-10 DIAGNOSIS — G4733 Obstructive sleep apnea (adult) (pediatric): Secondary | ICD-10-CM | POA: Diagnosis not present

## 2018-09-15 ENCOUNTER — Other Ambulatory Visit: Payer: Self-pay | Admitting: Internal Medicine

## 2018-09-16 DIAGNOSIS — G5621 Lesion of ulnar nerve, right upper limb: Secondary | ICD-10-CM | POA: Diagnosis not present

## 2018-09-16 DIAGNOSIS — M47812 Spondylosis without myelopathy or radiculopathy, cervical region: Secondary | ICD-10-CM | POA: Diagnosis not present

## 2018-10-03 DIAGNOSIS — Z1231 Encounter for screening mammogram for malignant neoplasm of breast: Secondary | ICD-10-CM | POA: Diagnosis not present

## 2018-10-03 LAB — HM MAMMOGRAPHY

## 2018-10-08 DIAGNOSIS — F411 Generalized anxiety disorder: Secondary | ICD-10-CM | POA: Diagnosis not present

## 2018-10-08 DIAGNOSIS — F331 Major depressive disorder, recurrent, moderate: Secondary | ICD-10-CM | POA: Diagnosis not present

## 2018-10-09 DIAGNOSIS — G5621 Lesion of ulnar nerve, right upper limb: Secondary | ICD-10-CM | POA: Diagnosis not present

## 2018-10-09 DIAGNOSIS — M5412 Radiculopathy, cervical region: Secondary | ICD-10-CM | POA: Diagnosis not present

## 2018-10-21 ENCOUNTER — Telehealth: Payer: Self-pay

## 2018-10-21 ENCOUNTER — Telehealth: Payer: BLUE CROSS/BLUE SHIELD | Admitting: Physician Assistant

## 2018-10-21 ENCOUNTER — Encounter: Payer: Self-pay | Admitting: Physician Assistant

## 2018-10-21 ENCOUNTER — Encounter (INDEPENDENT_AMBULATORY_CARE_PROVIDER_SITE_OTHER): Payer: Self-pay

## 2018-10-21 ENCOUNTER — Other Ambulatory Visit: Payer: Self-pay

## 2018-10-21 DIAGNOSIS — Z20822 Contact with and (suspected) exposure to covid-19: Secondary | ICD-10-CM

## 2018-10-21 DIAGNOSIS — R059 Cough, unspecified: Secondary | ICD-10-CM

## 2018-10-21 DIAGNOSIS — R05 Cough: Secondary | ICD-10-CM

## 2018-10-21 DIAGNOSIS — R197 Diarrhea, unspecified: Secondary | ICD-10-CM

## 2018-10-21 DIAGNOSIS — R6889 Other general symptoms and signs: Secondary | ICD-10-CM | POA: Diagnosis not present

## 2018-10-21 DIAGNOSIS — R0602 Shortness of breath: Secondary | ICD-10-CM

## 2018-10-21 MED ORDER — ALBUTEROL SULFATE HFA 108 (90 BASE) MCG/ACT IN AERS: 2.0000 | INHALATION_SPRAY | RESPIRATORY_TRACT | 0 refills | Status: DC | PRN

## 2018-10-21 MED ORDER — BENZONATATE 100 MG PO CAPS: 100.0000 mg | ORAL_CAPSULE | Freq: Three times a day (TID) | ORAL | 0 refills | Status: DC | PRN

## 2018-10-21 NOTE — Progress Notes (Signed)
E-Visit for Corona Virus Screening   Your current symptoms could be consistent with the coronavirus.  Many health care providers can now test patients at their office but not all are.  [facility] has multiple testing sites. For information on our COVID testing locations and hours go to [email or web address]  Please quarantine yourself while awaiting your test results.    COVID-19 is a respiratory illness with symptoms that are similar to the flu. Symptoms are typically mild to moderate, but there have been cases of severe illness and death due to the virus. The following symptoms may appear 2-14 days after exposure: . Fever . Cough . Shortness of breath or difficulty breathing . Chills . Repeated shaking with chills . Muscle pain . Headache . Sore throat . New loss of taste or smell . Fatigue . Congestion or runny nose . Nausea or vomiting . Diarrhea  It is vitally important that if you feel that you have an infection such as this virus or any other virus that you stay home and away from places where you may spread it to others.  You should self-quarantine for 14 days if you have symptoms that could potentially be coronavirus or have been in close contact a with a person diagnosed with COVID-19 within the last 2 weeks. You should avoid contact with people age 46 and older.   You should wear a mask or cloth face covering over your nose and mouth if you must be around other people or animals, including pets (even at home). Try to stay at least 6 feet away from other people. This will protect the people around you.  You can use medication such as A prescription cough medication called Tessalon Perles 100 mg. You may take 1-2 capsules every 8 hours as needed for cough and A prescription inhaler called Albuterol MDI 90 mcg /actuation 2 puffs every 4 hours as needed for shortness of breath, wheezing, cough  You may also take acetaminophen (Tylenol) as needed for  fever.   I have also provided a work note  You have been enrolled in [facility], which will contact you regarding your symptoms.    Reduce your risk of any infection by using the same precautions used for avoiding the common cold or flu:  Marland Kitchen Wash your hands often with soap and warm water for at least 20 seconds.  If soap and water are not readily available, use an alcohol-based hand sanitizer with at least 60% alcohol.  . If coughing or sneezing, cover your mouth and nose by coughing or sneezing into the elbow areas of your shirt or coat, into a tissue or into your sleeve (not your hands). . Avoid shaking hands with others and consider head nods or verbal greetings only. . Avoid touching your eyes, nose, or mouth with unwashed hands.  . Avoid close contact with people who are sick. . Avoid places or events with large numbers of people in one location, like concerts or sporting events. . Carefully consider travel plans you have or are making. . If you are planning any travel outside or inside the [place], visit the CDC's Travelers' Health webpage for the latest health notices. . If you have some symptoms but not all symptoms, continue to monitor at home and seek medical attention if your symptoms worsen. . If you are having a medical emergency, call 911.  HOME CARE . Only take medications as instructed by your medical team. . Drink plenty of fluids and get  plenty of rest. . A steam or ultrasonic humidifier can help if you have congestion.   GET HELP RIGHT AWAY IF YOU HAVE EMERGENCY WARNING SIGNS** FOR COVID-19. If you or someone is showing any of these signs seek emergency medical care immediately. Call 911 or proceed to your closest emergency facility if: . You develop worsening high fever. . Trouble breathing . Bluish lips or face . Persistent pain or pressure in the chest . New confusion . Inability to wake or stay awake . You cough up blood. . Your  symptoms become more severe  **This list is not all possible symptoms. Contact your medical provider for any symptoms that are sever or concerning to you.   MAKE SURE YOU   Understand these instructions.  Will watch your condition.  Will get help right away if you are not doing well or get worse.  Your e-visit answers were reviewed by a board certified advanced clinical practitioner to complete your personal care plan.  Depending on the condition, your plan could have included both over the counter or prescription medications.  If there is a problem please reply once you have received a response from your provider.  Your safety is important to [place].  If you have drug allergies check your prescription carefully.    You can use MyChart to ask questions about today's visit, request a non-urgent call back, or ask for a work or school excuse for 24 hours related to this e-Visit. If it has been greater than 24 hours you will need to follow up with your provider, or enter a new e-Visit to address those concerns. You will get an e-mail in the next two days asking about your experience.  I hope that your e-visit has been valuable and will speed your recovery. Thank you for using e-visits.   I spent 5-10 minutes on review and completion of this note- Lacy Duverney [facility]

## 2018-10-21 NOTE — Telephone Encounter (Signed)
SOB severe and coughing and severe diarrhea SOB at rest and with exertion. - feels like chest is heavy worse on right side. Occasionally with productive cough. Afebrile. Hurts with deep breathing. No coughing episodes or preventing sleep.  Pt fatigue is mild. Pt is walking and had 3 instances of vertigo. Pt thinks it could be from sinus congestion. HR 80/ minute. Saw bright light at the corner of vision. Pt stated it started this am .   H/O asthma. Temperature 97.8-forehead. Called PCP and notified FC of symptoms. NT informed to send pt to ED for evaluation.  Pt instructed to go to ED but she refused. Pt stated if she gets worse she promised she would go to ED. Advised pt that due to her h/o asthma she is considered high risk of complications such as viral pna or asthma exacerbation or both.  Pt instructed: Weakness: If patient has worsening weakness with inability to stand or if patient has to hold on to something to get balance, advise patient to call 911 and seek treatment in ED   . IEP:PIRJJOACZ of breath is the same:   continue to monitor at home  If symptoms become severe, i.e. shortness of breath at rest, gasping for air, wheezing, call 911 and seek treatment in the ED  . Diarrhea: If diarrhea remains the same:  encourage patient to drink oral fluids and bland foods.  . Avoid alcohol, spicy foods, caffeine or fatty foods that could make diarrhea worse.  . Continue to monitor for signs of dehydration (increased thirst decreased urine output, yellow urine, dry skin, headache or dizziness).  Advise patient to try OTC medication (Imodium, kaopectate, Pepto-Bismol) as per manufacturer's instructions. . If worsening diarrhea occurs and becomes severe (6-7 bowel movements a day): notify PCP  . If diarrhea last greater than 7 days: notify PCP If signs of dehydration occur (increased thirst, decreased urine output, YELLOW URINE,DRY SKIN,HEADACHE OR DIZZINESS) ADVISE PATIENT TO CALL 911 AND SEEK  TREATMENT IN THE ED.

## 2018-10-21 NOTE — Telephone Encounter (Signed)
Agree with referring to ER due to shortness of breath with rest and exertion and chest heaviness.

## 2018-10-22 ENCOUNTER — Telehealth: Payer: Self-pay | Admitting: Internal Medicine

## 2018-10-22 ENCOUNTER — Encounter (INDEPENDENT_AMBULATORY_CARE_PROVIDER_SITE_OTHER): Payer: Self-pay

## 2018-10-22 NOTE — Telephone Encounter (Signed)
Great - I will look for pulmonology note tomorrow. Thanks!

## 2018-10-22 NOTE — Telephone Encounter (Signed)
Called patient, she does not think it is heart related.  She thinks she might have pneumonia.  States she can not afford to go to ER.  I did offer an virtual appt, but states has one tomorrow with pulmonologist and will see what they say.

## 2018-10-22 NOTE — Telephone Encounter (Signed)
Called patient regarding her phone message. Patient states she gets this every year when the humidity flares up. She states she is having some shortness of breath and some cough. Patient also has been having diarrhea. She was covid tested yesterday. Patient stated she usually ends up on antibiotics and prednisone.   Because of awaiting results, I scheduled patient for a phone visit with Dr. Mortimer Fries first thing tomorrow morning. Patient is aware if her breathing gets worse to go to the emergency room. Patient stated her breathing is no where near that bad.   DK please review, this is a DR patient.

## 2018-10-23 ENCOUNTER — Encounter (INDEPENDENT_AMBULATORY_CARE_PROVIDER_SITE_OTHER): Payer: Self-pay

## 2018-10-23 ENCOUNTER — Ambulatory Visit (INDEPENDENT_AMBULATORY_CARE_PROVIDER_SITE_OTHER): Payer: BC Managed Care – PPO | Admitting: Internal Medicine

## 2018-10-23 ENCOUNTER — Encounter: Payer: Self-pay | Admitting: Internal Medicine

## 2018-10-23 DIAGNOSIS — J4521 Mild intermittent asthma with (acute) exacerbation: Secondary | ICD-10-CM | POA: Diagnosis not present

## 2018-10-23 MED ORDER — FLUCONAZOLE 100 MG PO TABS: 100.0000 mg | ORAL_TABLET | Freq: Once | ORAL | 5 refills | Status: AC

## 2018-10-23 MED ORDER — AMOXICILLIN-POT CLAVULANATE 875-125 MG PO TABS: 1.0000 | ORAL_TABLET | Freq: Two times a day (BID) | ORAL | 0 refills | Status: AC

## 2018-10-23 MED ORDER — PREDNISONE 20 MG PO TABS: 40.0000 mg | ORAL_TABLET | Freq: Every day | ORAL | 0 refills | Status: DC

## 2018-10-23 NOTE — Patient Instructions (Signed)
  Prednisone 40 mg daily for 10 days Augmentin 875 PO BID Diflucan for yeast infection prevention as requested by patient

## 2018-10-23 NOTE — Progress Notes (Signed)
Jamie Hardin Consultation     I connected with the patient by video/telephone enabled telemedicine visit and verified that I am speaking with the correct person using two identifiers.    I discussed the limitations, risks, security and privacy concerns of performing an evaluation and management service by telemedicine and the availability of in-person appointments. I also discussed with the patient that there may be a patient responsible charge related to this service. The patient expressed understanding and agreed to proceed.  PATIENT AGREES AND CONFIRMS -YES   Other persons participating in the visit and their role in the encounter: Patient, nursing   Patient's location: Home Provider's location: Clinic   I discussed the limitations, risks, security and privacy concerns of performing an evaluation and management service by telephone and the availability of in person appointments. I also discussed with the patient that there may be a patient responsible charge related to this service. The patient expressed understanding and agreed to proceed.  This visit type was conducted due to national recommendations for restrictions regarding the COVID-19 Pandemic (e.g. social distancing).  This format is felt to be most appropriate for this patient at this time.  All issues noted in this document were discussed and addressed.        Date: 10/23/2018  MRN# [id number] Jamie Hardin 1970-04-14    Jamie Hardin is a 48 y.o. old female seen in consultation for chief complaint of: dyspnea and cough.      HPI:  +cough +wheezing +SOB +DOE +humid weather +diarrhea No fevers  She lost 100 pounds  +asthma exacerbation +sinus pain and pressure  COVID test pending    CBC 01/14/16; eosinophil count equals 100.   Review of Systems:  Gen:  Denies  fever, sweats, chills weight loss  HEENT: Denies blurred vision, double vision, ear pain, eye pain, hearing loss, nose  bleeds, sore throat Cardiac:  No dizziness, chest pain or heaviness, chest tightness,edema, No JVD Resp:  +cough, +sputum production, +shortness of breath,+wheezing, -hemoptysis,  Gi: Denies swallowing difficulty, , +diarrhea Other:  All other systems negative  Social Hx:   Social History   Tobacco Use  . Smoking status: Former Smoker    Packs/day: 1.00    Years: 20.00    Pack years: 20.00    Types: Cigarettes    Quit date: 04/04/2003    Years since quitting: 15.5  . Smokeless tobacco: Never Used  Substance Use Topics  . Alcohol use: Yes    Comment: Very Rare  . Drug use: No   Medication:    Current Outpatient Medications:  .  albuterol (ACCUNEB) 0.63 MG/3ML nebulizer solution, USE 1 VIAL VIA NEBULIZER EVERY 4 HOURS AS NEEDED FOR WHEEZING, Disp: 75 mL, Rfl: 3 .  albuterol (VENTOLIN HFA) 108 (90 Base) MCG/ACT inhaler, Inhale 2 puffs into the lungs every 4 (four) hours as needed for wheezing or shortness of breath., Disp: 6.7 g, Rfl: 0 .  baclofen (LIORESAL) 10 MG tablet, Take 10 mg by mouth 2 (two) times daily as needed., Disp: , Rfl:  .  benzonatate (TESSALON) 100 MG capsule, Take 1 capsule (100 mg total) by mouth 3 (three) times daily as needed for cough., Disp: 20 capsule, Rfl: 0 .  Beta Carotene (VITAMIN A) 25000 UNIT capsule, Take 25,000 Units by mouth daily., Disp: , Rfl:  .  Biotin 1 MG CAPS, Take 1 tablet by mouth daily., Disp: , Rfl:  .  calcium-vitamin D 250-100 MG-UNIT tablet, Take 1 tablet by  mouth 2 (two) times daily., Disp: , Rfl:  .  clonazePAM (KLONOPIN) 0.5 MG tablet, Take 0.5 mg by mouth 2 (two) times daily as needed., Disp: , Rfl: 0 .  DM-Doxylamine-Acetaminophen (NYQUIL COLD & FLU PO), Take by mouth Nightly., Disp: , Rfl:  .  fluticasone-salmeterol (ADVAIR HFA) 230-21 MCG/ACT inhaler, Inhale 2 puffs into the lungs 2 (two) times daily. 2 puffs twice daily. Rinse mouth after use., Disp: 1 Inhaler, Rfl: 12 .  ibuprofen (ADVIL) 200 MG tablet, Take 1-2 tablets (200-400  mg total) by mouth every 6 (six) hours as needed., Disp: , Rfl:  .  levocetirizine (XYZAL) 5 MG tablet, Take 1 tablet (5 mg total) by mouth daily., Disp: , Rfl:  .  montelukast (SINGULAIR) 10 MG tablet, Take 1 tablet (10 mg total) by mouth at bedtime., Disp: 30 tablet, Rfl: 11 .  Multiple Vitamins-Minerals (MULTIVITAMIN WITH MINERALS) tablet, Take 1 tablet by mouth daily., Disp: , Rfl:  .  omeprazole (PRILOSEC) 20 MG capsule, Take 20 mg by mouth as needed. , Disp: , Rfl:  .  ondansetron (ZOFRAN-ODT) 4 MG disintegrating tablet, DIS ONE T PO Q 8 H PRN N FOR UP TO 7 DAYS, Disp: , Rfl: 0 .  pantoprazole (PROTONIX) 20 MG tablet, Take 20 mg by mouth daily., Disp: , Rfl:  .  PROAIR HFA 108 (90 Base) MCG/ACT inhaler, INHALE 2 PUFFS BY MOUTH INTO THE LUNGS EVERY 4 HOURS AS NEEDED FOR WHEEZING OR SHORTNESS OF BREATH, Disp: 8.5 g, Rfl: 2 .  promethazine (PHENERGAN) 25 MG tablet, TAKE ONE TABLET BY MOUTH EVERY 6 HOURS AS NEEDED FOR NAUSEA FOR UP TO 7 DAYS., Disp: , Rfl: 0 .  thiamine (VITAMIN B-1) 100 MG tablet, Take 100 mg by mouth daily., Disp: , Rfl:  .  valACYclovir (VALTREX) 1000 MG tablet, TAKE 2 TABLETS BY MOUTH AT FIRST SIGN OF OUTBREAK FOLLOWED BY 2 TABLETS MORE 12 HOURS LATER, Disp: 4 tablet, Rfl: 5 .  venlafaxine XR (EFFEXOR XR) 37.5 MG 24 hr capsule, Take 1 capsule (37.5 mg total) by mouth daily with breakfast., Disp: 30 capsule, Rfl: 0 .  vitamin B-12 (CYANOCOBALAMIN) 100 MCG tablet, Take 100 mcg by mouth daily., Disp: , Rfl:  .  vitamin E 100 UNIT capsule, Take 100 Units by mouth daily., Disp: , Rfl:  .  vitamin k 100 MCG tablet, Take 100 mcg by mouth daily., Disp: , Rfl:    Allergies:  Codeine and Naproxen       ACUTE ASTHMA EXACERBATION with sinus infection  Prednisone 40 mg daily for 10 days Augmentin 875 PO BID Diflucan for yeast infection prevention as requested by patient  Continue CPAP as tolerated  Follow up with Dr Juanell Fairly   COVID-19 EDUCATION: The signs and symptoms of  COVID-19 were discussed with the patient and how to seek care for testing.  The importance of social distancing was discussed today. Hand Washing Techniques and avoid touching face was advised.  MEDICATION ADJUSTMENTS/LABS AND TESTS ORDERED:  Prednisone 40 mg daily for 10 days Augmentin 875 PO BID Diflucan for yeast infection prevention as requested by patient     CURRENT MEDICATIONS REVIEWED AT [facility]   Patient satisfied with Plan of action and management. All questions answered  Follow up in 6 months   Total time spent 23 mins  Maretta Bees Patricia Pesa, M.D.  [facility]  Medical Director [place] Director [facility] Cardio-Pulmonary Department

## 2018-10-24 ENCOUNTER — Telehealth: Payer: Self-pay | Admitting: *Deleted

## 2018-10-24 ENCOUNTER — Encounter (INDEPENDENT_AMBULATORY_CARE_PROVIDER_SITE_OTHER): Payer: Self-pay

## 2018-10-24 LAB — NOVEL CORONAVIRUS, NAA: SARS-CoV-2, NAA: NOT DETECTED

## 2018-10-24 NOTE — Telephone Encounter (Signed)
Will you have her contact the provider that prescribed this or go to urgent care as we do not have availability today to see her for drug reaction and if she was prescribed and antibiotic we may need to switch it. Stop medication and Can take 25mg  of benadryl for rash

## 2018-10-24 NOTE — Telephone Encounter (Signed)
Contacted pt regarding MyChart Companion response of diarrhea; the pt says that she has been diarrhea since 10/22/2018; she is having 2 episodes daily; she has also been having severe cramping; the pt also says that this morning she woke up with a rash on both arms; she started Augmentin on 10/23/2018, and has taken it numerous times before without difficulty; recommendations for pt to contact her PCP for further recommendations; she verbalized understanding.

## 2018-10-24 NOTE — Telephone Encounter (Signed)
DK please advise. Please see attached images.

## 2018-10-24 NOTE — Telephone Encounter (Signed)
Pt has taken Augmentin several times and has not had any reaction. Stated shes had a reaction before to naproxen which left hives on her abdomen. Gave her the instructions provided and she stated if things got worse she would go to Urgent Care or the ER.

## 2018-10-25 ENCOUNTER — Encounter (INDEPENDENT_AMBULATORY_CARE_PROVIDER_SITE_OTHER): Payer: Self-pay

## 2018-10-26 ENCOUNTER — Encounter (INDEPENDENT_AMBULATORY_CARE_PROVIDER_SITE_OTHER): Payer: Self-pay

## 2018-10-27 ENCOUNTER — Encounter (INDEPENDENT_AMBULATORY_CARE_PROVIDER_SITE_OTHER): Payer: Self-pay

## 2018-10-29 ENCOUNTER — Encounter (INDEPENDENT_AMBULATORY_CARE_PROVIDER_SITE_OTHER): Payer: Self-pay

## 2018-10-30 ENCOUNTER — Encounter (INDEPENDENT_AMBULATORY_CARE_PROVIDER_SITE_OTHER): Payer: Self-pay

## 2018-10-31 ENCOUNTER — Other Ambulatory Visit: Payer: Self-pay

## 2018-10-31 ENCOUNTER — Encounter (INDEPENDENT_AMBULATORY_CARE_PROVIDER_SITE_OTHER): Payer: Self-pay

## 2018-10-31 DIAGNOSIS — Z20822 Contact with and (suspected) exposure to covid-19: Secondary | ICD-10-CM

## 2018-10-31 DIAGNOSIS — R6889 Other general symptoms and signs: Secondary | ICD-10-CM | POA: Diagnosis not present

## 2018-11-01 ENCOUNTER — Encounter (INDEPENDENT_AMBULATORY_CARE_PROVIDER_SITE_OTHER): Payer: Self-pay

## 2018-11-02 ENCOUNTER — Encounter (INDEPENDENT_AMBULATORY_CARE_PROVIDER_SITE_OTHER): Payer: Self-pay

## 2018-11-03 ENCOUNTER — Encounter (INDEPENDENT_AMBULATORY_CARE_PROVIDER_SITE_OTHER): Payer: Self-pay

## 2018-11-03 LAB — NOVEL CORONAVIRUS, NAA: SARS-CoV-2, NAA: NOT DETECTED

## 2018-11-12 DIAGNOSIS — G894 Chronic pain syndrome: Secondary | ICD-10-CM | POA: Diagnosis not present

## 2018-11-12 DIAGNOSIS — M542 Cervicalgia: Secondary | ICD-10-CM | POA: Diagnosis not present

## 2018-11-15 DIAGNOSIS — M9901 Segmental and somatic dysfunction of cervical region: Secondary | ICD-10-CM | POA: Diagnosis not present

## 2018-11-15 DIAGNOSIS — M545 Low back pain: Secondary | ICD-10-CM | POA: Diagnosis not present

## 2018-11-15 DIAGNOSIS — M62830 Muscle spasm of back: Secondary | ICD-10-CM | POA: Diagnosis not present

## 2018-11-15 DIAGNOSIS — M9903 Segmental and somatic dysfunction of lumbar region: Secondary | ICD-10-CM | POA: Diagnosis not present

## 2018-11-18 DIAGNOSIS — Z9884 Bariatric surgery status: Secondary | ICD-10-CM | POA: Diagnosis not present

## 2018-11-26 DIAGNOSIS — Z9884 Bariatric surgery status: Secondary | ICD-10-CM | POA: Diagnosis not present

## 2018-11-26 DIAGNOSIS — Z713 Dietary counseling and surveillance: Secondary | ICD-10-CM | POA: Diagnosis not present

## 2018-11-26 DIAGNOSIS — Z6841 Body Mass Index (BMI) 40.0 and over, adult: Secondary | ICD-10-CM | POA: Diagnosis not present

## 2018-12-10 DIAGNOSIS — M47814 Spondylosis without myelopathy or radiculopathy, thoracic region: Secondary | ICD-10-CM | POA: Diagnosis not present

## 2018-12-13 DIAGNOSIS — G4733 Obstructive sleep apnea (adult) (pediatric): Secondary | ICD-10-CM | POA: Diagnosis not present

## 2018-12-18 DIAGNOSIS — M47814 Spondylosis without myelopathy or radiculopathy, thoracic region: Secondary | ICD-10-CM | POA: Diagnosis not present

## 2018-12-24 DIAGNOSIS — Z23 Encounter for immunization: Secondary | ICD-10-CM | POA: Diagnosis not present

## 2018-12-30 DIAGNOSIS — M722 Plantar fascial fibromatosis: Secondary | ICD-10-CM | POA: Diagnosis not present

## 2018-12-30 DIAGNOSIS — M9903 Segmental and somatic dysfunction of lumbar region: Secondary | ICD-10-CM | POA: Diagnosis not present

## 2018-12-30 DIAGNOSIS — M62830 Muscle spasm of back: Secondary | ICD-10-CM | POA: Diagnosis not present

## 2018-12-30 DIAGNOSIS — M545 Low back pain: Secondary | ICD-10-CM | POA: Diagnosis not present

## 2018-12-31 DIAGNOSIS — F411 Generalized anxiety disorder: Secondary | ICD-10-CM | POA: Diagnosis not present

## 2018-12-31 DIAGNOSIS — F331 Major depressive disorder, recurrent, moderate: Secondary | ICD-10-CM | POA: Diagnosis not present

## 2019-01-07 DIAGNOSIS — M47814 Spondylosis without myelopathy or radiculopathy, thoracic region: Secondary | ICD-10-CM | POA: Diagnosis not present

## 2019-01-07 DIAGNOSIS — G894 Chronic pain syndrome: Secondary | ICD-10-CM | POA: Diagnosis not present

## 2019-01-07 DIAGNOSIS — G5621 Lesion of ulnar nerve, right upper limb: Secondary | ICD-10-CM | POA: Diagnosis not present

## 2019-01-07 DIAGNOSIS — M542 Cervicalgia: Secondary | ICD-10-CM | POA: Diagnosis not present

## 2019-01-09 ENCOUNTER — Encounter: Payer: Self-pay | Admitting: Family Medicine

## 2019-01-09 ENCOUNTER — Ambulatory Visit (INDEPENDENT_AMBULATORY_CARE_PROVIDER_SITE_OTHER): Payer: BC Managed Care – PPO | Admitting: Family Medicine

## 2019-01-09 ENCOUNTER — Other Ambulatory Visit: Payer: Self-pay

## 2019-01-09 DIAGNOSIS — J310 Chronic rhinitis: Secondary | ICD-10-CM

## 2019-01-09 DIAGNOSIS — J329 Chronic sinusitis, unspecified: Secondary | ICD-10-CM

## 2019-01-09 MED ORDER — FLUCONAZOLE 150 MG PO TABS: 150.0000 mg | ORAL_TABLET | ORAL | 0 refills | Status: DC | PRN

## 2019-01-09 MED ORDER — IPRATROPIUM BROMIDE 0.06 % NA SOLN: 2.0000 | Freq: Four times a day (QID) | NASAL | 12 refills | Status: DC

## 2019-01-09 MED ORDER — AMOXICILLIN-POT CLAVULANATE 875-125 MG PO TABS: 1.0000 | ORAL_TABLET | Freq: Two times a day (BID) | ORAL | 0 refills | Status: AC

## 2019-01-09 NOTE — Progress Notes (Signed)
Name: Jamie Hardin   MRN: [id number]    DOB: 1970/06/06   Date:01/09/2019       Progress Note  Subjective:    Chief Complaint  Chief Complaint  Patient presents with  . Allergies    has postnasal drip at night that makes pt cough.    I connected with  Jamie Hardin  on 01/09/19 at  2:40 PM EDT by a video enabled telemedicine application and verified that I am speaking with the correct person using two identifiers.  I discussed the limitations of evaluation and management by telemedicine and the availability of in person appointments. The patient expressed understanding and agreed to proceed. Staff also discussed with the patient that there may be a patient responsible charge related to this service. 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  She has a history of seasonal allergies and a long history of sinusitis with prior surgeries.  She typically is on antihistamines year-round and has worsening symptoms in fall and and spring.  She switched earlier this year to [place] it does seem to help her more than other antihistamines.  She is having worsening symptoms for the past 2 weeks.  She wants to add on a nasal spray.  She is unable to tolerate Flonase because it makes her feel like she has a sinus infection is very irritating to her.  She would like to try Atrovent nasal spray which she is done before has been very effective.  She is also using a Nettie pot type system to irrigate her sinuses.  She is having worse congestion, left maxillary sinus pain mildly tender.  She is having cough due to postnasal drip which is worse at night where she has to Hardin up for several hours. She does have a history of asthma but is not having to use her inhaler.  She denies associated headaches, fever, chills, sweats, body aches, sore throat, wheeze, chest  tightness, productive cough, chest pain or shortness of breath. She's had two sinus surgeries and long hx of rhinitis Her left maxillary sinus is ttp, but she doesn't think she needs abx yet, she does typically need antibiotics 1-2 times a year usually in the spring and fall.  She would like to increase her medicines and see if she can get it to improve first but when she does use antibiotics typically 10 days of Augmentin, when taking antibiotic she does need Diflucan for secondary yeast infections.  Patient Active Problem List   Diagnosis Date Noted  . Hot flashes 07/25/2018  . Onychomycosis 07/25/2018  . DDD (degenerative disc disease), cervical 07/11/2017  . Pain in joint involving ankle and foot 07/11/2017  . Low back pain 07/11/2017  . 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 Influenza vaccination declined by patient 01/15/2017  . Contusion of right knee 11/15/2016  . Subluxation of shoulder joint 11/15/2016  . Abnormal mammogram of both breasts 02/04/2016  . History of pneumonia as indication for 23-polyvalent pneumococcal polysaccharide vaccine 01/14/2016  . Preventative health care 01/14/2016  . Facial pain, atypical 04/27/2015  . Eustachian tube dysfunction 04/27/2015  . 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 Abnormal thyroid function test 02/15/2015  . Rash 02/15/2015  . Breast cancer screening 01/12/2015  . Elevated blood-pressure reading without diagnosis of hypertension 01/12/2015  . GAD (generalized anxiety disorder)  01/12/2015  . Obstructive sleep apnea on CPAP 01/12/2015  . Allergy   . Asthma   . Morbid obesity ([place])   . Dysthymic disorder   . Peripheral neuropathy   . GERD (gastroesophageal reflux disease)   . Fibromyalgia 07/22/2014  . 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 Morbid obesity with BMI of 50.0-59.9, adult ([place]) 07/22/2014    Social History   Tobacco Use  .  Smoking status: Former Smoker    Packs/day: 1.00    Years: 20.00    Pack years: 20.00    Types: Cigarettes    Quit date: 04/04/2003    Years since quitting: 15.7  . Smokeless tobacco: Never Used  Substance Use Topics  . Alcohol use: Yes    Comment: Very Rare     Current Outpatient Medications:  .  albuterol (ACCUNEB) 0.63 MG/3ML nebulizer solution, USE 1 VIAL VIA NEBULIZER EVERY 4 HOURS AS NEEDED FOR WHEEZING, Disp: 75 mL, Rfl: 3 .  albuterol (VENTOLIN HFA) 108 (90 Base) MCG/ACT inhaler, Inhale 2 puffs into the lungs every 4 (four) hours as needed for wheezing or shortness of breath., Disp: 6.7 g, Rfl: 0 .  Beta Carotene (VITAMIN A) 25000 UNIT capsule, Take 25,000 Units by mouth daily., Disp: , Rfl:  .  Biotin 1 MG CAPS, Take 1 tablet by mouth daily., Disp: , Rfl:  .  calcium-vitamin D 250-100 MG-UNIT tablet, Take 1 tablet by mouth 2 (two) times daily., Disp: , Rfl:  .  clonazePAM (KLONOPIN) 0.5 MG tablet, Take 0.5 mg by mouth 2 (two) times daily as needed., Disp: , Rfl: 0 .  fluticasone-salmeterol (ADVAIR HFA) 230-21 MCG/ACT inhaler, Inhale 2 puffs into the lungs 2 (two) times daily. 2 puffs twice daily. Rinse mouth after use., Disp: 1 Inhaler, Rfl: 12 .  ibuprofen (ADVIL) 200 MG tablet, Take 1-2 tablets (200-400 mg total) by mouth every 6 (six) hours as needed., Disp: , Rfl:  .  levocetirizine (XYZAL) 5 MG tablet, Take 1 tablet (5 mg total) by mouth daily., Disp: , Rfl:  .  Liraglutide -Weight Management (SAXENDA) 18 MG/3ML SOPN, Inject into the skin 1 day or 1 dose., Disp: , Rfl:  .  Multiple Vitamins-Minerals (MULTIVITAMIN WITH MINERALS) tablet, Take 1 tablet by mouth daily., Disp: , Rfl:  .  ondansetron (ZOFRAN-ODT) 4 MG disintegrating tablet, DIS ONE T PO Q 8 H PRN N FOR UP TO 7 DAYS, Disp: , Rfl: 0 .  PROAIR HFA 108 (90 Base) MCG/ACT inhaler, INHALE 2 PUFFS BY MOUTH INTO THE LUNGS EVERY 4 HOURS AS NEEDED FOR WHEEZING OR SHORTNESS OF BREATH, Disp: 8.5 g, Rfl: 2 .  promethazine  (PHENERGAN) 25 MG tablet, TAKE ONE TABLET BY MOUTH EVERY 6 HOURS AS NEEDED FOR NAUSEA FOR UP TO 7 DAYS., Disp: , Rfl: 0 .  thiamine (VITAMIN B-1) 100 MG tablet, Take 100 mg by mouth daily., Disp: , Rfl:  .  valACYclovir (VALTREX) 1000 MG tablet, TAKE 2 TABLETS BY MOUTH AT FIRST SIGN OF OUTBREAK FOLLOWED BY 2 TABLETS MORE 12 HOURS LATER, Disp: 4 tablet, Rfl: 5 .  vitamin B-12 (CYANOCOBALAMIN) 100 MCG tablet, Take 100 mcg by mouth daily., Disp: , Rfl:  .  vitamin E 100 UNIT capsule, Take 100 Units by mouth daily., Disp: , Rfl:  .  vitamin k 100 MCG tablet, Take 100 mcg by mouth daily., Disp: , Rfl:  .  baclofen (LIORESAL) 10 MG tablet, Take 10 mg by mouth 2 (two) times  daily as needed., Disp: , Rfl:  .  benzonatate (TESSALON) 100 MG capsule, Take 1 capsule (100 mg total) by mouth 3 (three) times daily as needed for cough. (Patient not taking: Reported on 01/09/2019), Disp: 20 capsule, Rfl: 0 .  DM-Doxylamine-Acetaminophen (NYQUIL COLD & FLU PO), Take by mouth Nightly., Disp: , Rfl:  .  montelukast (SINGULAIR) 10 MG tablet, Take 1 tablet (10 mg total) by mouth at bedtime. (Patient not taking: Reported on 01/09/2019), Disp: 30 tablet, Rfl: 11 .  omeprazole (PRILOSEC) 20 MG capsule, Take 20 mg by mouth as needed. , Disp: , Rfl:  .  pantoprazole (PROTONIX) 20 MG tablet, Take 20 mg by mouth daily., Disp: , Rfl:  .  predniSONE (DELTASONE) 20 MG tablet, Take 2 tablets (40 mg total) by mouth daily with breakfast. 10 days (Patient not taking: Reported on 01/09/2019), Disp: 20 tablet, Rfl: 0 .  venlafaxine XR (EFFEXOR XR) 37.5 MG 24 hr capsule, Take 1 capsule (37.5 mg total) by mouth daily with breakfast. (Patient not taking: Reported on 01/09/2019), Disp: 30 capsule, Rfl: 0  Allergies  Allergen Reactions  . Codeine   . Naproxen     I personally reviewed active problem list, medication list, allergies, health maintenance, notes from last encounter, lab results with the patient/caregiver today.  Review of  Systems  Constitutional: Negative.   HENT: Negative.   Eyes: Negative.   Respiratory: Negative.   Cardiovascular: Negative.   Gastrointestinal: Negative.   Endocrine: Negative.   Genitourinary: Negative.   Musculoskeletal: Negative.   Skin: Negative.   Allergic/Immunologic: Negative.   Neurological: Negative.   Hematological: Negative.   Psychiatric/Behavioral: Negative.   All other systems reviewed and are negative.    Objective:   Virtual encounter, vitals limited, only able to obtain the following There were no vitals filed for this visit. There is no height or weight on file to calculate BMI. Nursing Note and Vital Signs reviewed.  Physical Exam Vitals signs and nursing note reviewed.  Constitutional:      General: She is not in acute distress.    Appearance: Normal appearance. She is well-developed. She is not ill-appearing, toxic-appearing or diaphoretic.  HENT:     Head: Normocephalic and atraumatic.     Nose: Nose normal.     Comments: Normal-appearing and video encounter normal phonation, she does press on her face and notes a little mild tenderness to palpation to left maxillary sinus Eyes:     General:        Right eye: No discharge.        Left eye: No discharge.     Conjunctiva/sclera: Conjunctivae normal.  Neck:     Trachea: No tracheal deviation.  Cardiovascular:     Rate and Rhythm: Normal rate and regular rhythm.  Pulmonary:     Effort: Pulmonary effort is normal. No respiratory distress.     Breath sounds: No stridor.  Musculoskeletal: Normal range of motion.  Skin:    General: Skin is warm and dry.     Findings: No rash.  Neurological:     Mental Status: She is alert.     Motor: No abnormal muscle tone.     Coordination: Coordination normal.  Psychiatric:        Mood and Affect: Mood normal.        Behavior: Behavior normal.     PE limited by telephone encounter  No results found for this or any previous visit (from the past 72  hour(s)).  Assessment  and Plan:     ICD-10-CM   1. 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  She has a long history of sinus infections in past sinus surgeries, she does not feel like she needs antibiotics right now but it has been more than 2 weeks of worsening symptoms.  Augmentin was sent to the pharmacy for her to hold in case she develops any severe worsening of facial/sinus pain or fever indicative of acute bacterial sinusitis.  Patient requires Diflucan when taking antibiotics of those were also sent in.   - I discussed the assessment and treatment plan with the patient. The patient was provided an opportunity to ask questions and all were answered. The patient agreed with the plan and demonstrated an understanding of the instructions.  I provided 15 minutes of non-face-to-face time during this encounter.  Delsa Grana, PA-C 01/09/19 3:13 PM

## 2019-01-20 DIAGNOSIS — K912 Postsurgical malabsorption, not elsewhere classified: Secondary | ICD-10-CM | POA: Diagnosis not present

## 2019-02-04 DIAGNOSIS — M542 Cervicalgia: Secondary | ICD-10-CM | POA: Diagnosis not present

## 2019-02-04 DIAGNOSIS — M47814 Spondylosis without myelopathy or radiculopathy, thoracic region: Secondary | ICD-10-CM | POA: Diagnosis not present

## 2019-02-04 DIAGNOSIS — G894 Chronic pain syndrome: Secondary | ICD-10-CM | POA: Diagnosis not present

## 2019-02-04 DIAGNOSIS — M5412 Radiculopathy, cervical region: Secondary | ICD-10-CM | POA: Diagnosis not present

## 2019-02-07 DIAGNOSIS — H04123 Dry eye syndrome of bilateral lacrimal glands: Secondary | ICD-10-CM | POA: Diagnosis not present

## 2019-02-12 ENCOUNTER — Other Ambulatory Visit: Payer: Self-pay

## 2019-02-12 DIAGNOSIS — Z20822 Contact with and (suspected) exposure to covid-19: Secondary | ICD-10-CM

## 2019-02-14 LAB — NOVEL CORONAVIRUS, NAA: SARS-CoV-2, NAA: NOT DETECTED

## 2019-03-05 DIAGNOSIS — M47814 Spondylosis without myelopathy or radiculopathy, thoracic region: Secondary | ICD-10-CM | POA: Diagnosis not present

## 2019-03-05 DIAGNOSIS — M5412 Radiculopathy, cervical region: Secondary | ICD-10-CM | POA: Diagnosis not present

## 2019-03-05 DIAGNOSIS — M542 Cervicalgia: Secondary | ICD-10-CM | POA: Diagnosis not present

## 2019-03-05 DIAGNOSIS — G894 Chronic pain syndrome: Secondary | ICD-10-CM | POA: Diagnosis not present

## 2019-03-14 DIAGNOSIS — M5412 Radiculopathy, cervical region: Secondary | ICD-10-CM | POA: Diagnosis not present

## 2019-03-14 DIAGNOSIS — Z79899 Other long term (current) drug therapy: Secondary | ICD-10-CM | POA: Diagnosis not present

## 2019-03-14 DIAGNOSIS — M545 Low back pain: Secondary | ICD-10-CM | POA: Diagnosis not present

## 2019-03-14 DIAGNOSIS — Z5181 Encounter for therapeutic drug level monitoring: Secondary | ICD-10-CM | POA: Diagnosis not present

## 2019-03-14 DIAGNOSIS — M47814 Spondylosis without myelopathy or radiculopathy, thoracic region: Secondary | ICD-10-CM | POA: Diagnosis not present

## 2019-03-14 DIAGNOSIS — G894 Chronic pain syndrome: Secondary | ICD-10-CM | POA: Diagnosis not present

## 2019-03-19 DIAGNOSIS — G4733 Obstructive sleep apnea (adult) (pediatric): Secondary | ICD-10-CM | POA: Diagnosis not present

## 2019-03-25 DIAGNOSIS — H04123 Dry eye syndrome of bilateral lacrimal glands: Secondary | ICD-10-CM | POA: Diagnosis not present

## 2019-04-02 DIAGNOSIS — J45909 Unspecified asthma, uncomplicated: Secondary | ICD-10-CM | POA: Diagnosis not present

## 2019-04-02 DIAGNOSIS — M5412 Radiculopathy, cervical region: Secondary | ICD-10-CM | POA: Diagnosis not present

## 2019-04-09 DIAGNOSIS — M545 Low back pain: Secondary | ICD-10-CM | POA: Diagnosis not present

## 2019-04-09 DIAGNOSIS — M47814 Spondylosis without myelopathy or radiculopathy, thoracic region: Secondary | ICD-10-CM | POA: Diagnosis not present

## 2019-04-09 DIAGNOSIS — G894 Chronic pain syndrome: Secondary | ICD-10-CM | POA: Diagnosis not present

## 2019-04-09 DIAGNOSIS — M5412 Radiculopathy, cervical region: Secondary | ICD-10-CM | POA: Diagnosis not present

## 2019-04-10 DIAGNOSIS — Z20828 Contact with and (suspected) exposure to other viral communicable diseases: Secondary | ICD-10-CM | POA: Diagnosis not present

## 2019-04-14 ENCOUNTER — Encounter: Payer: Self-pay | Admitting: Family Medicine

## 2019-04-17 DIAGNOSIS — D229 Melanocytic nevi, unspecified: Secondary | ICD-10-CM | POA: Diagnosis not present

## 2019-04-17 DIAGNOSIS — D1801 Hemangioma of skin and subcutaneous tissue: Secondary | ICD-10-CM | POA: Diagnosis not present

## 2019-04-18 ENCOUNTER — Ambulatory Visit (INDEPENDENT_AMBULATORY_CARE_PROVIDER_SITE_OTHER): Payer: BC Managed Care – PPO | Admitting: Family Medicine

## 2019-04-18 ENCOUNTER — Encounter: Payer: Self-pay | Admitting: Family Medicine

## 2019-04-18 VITALS — BP 117/70 | HR 80 | Ht 65.0 in | Wt 244.0 lb

## 2019-04-18 DIAGNOSIS — B351 Tinea unguium: Secondary | ICD-10-CM | POA: Diagnosis not present

## 2019-04-18 MED ORDER — TERBINAFINE HCL 250 MG PO TABS: 250.0000 mg | ORAL_TABLET | Freq: Every day | ORAL | 1 refills | Status: DC

## 2019-04-18 NOTE — Patient Instructions (Signed)
This is the current plan:    ICD-10-CM   1. Onychomycosis  B35.1 terbinafine (LAMISIL) 250 MG tablet   right great and 5th toenail, onset over a year ago, failed topical, start oral after baseline labs if LFTs normal, 3 month f/up, sooner if RUQ or abd pain    Onychomycosis toenail - We will start doing daily medications for the next 3 months after I have a chance to check and verify your lab work through [place] is normal and there is no liver problems preventing this treatment.  Please schedule a 52-month follow-up for me to check in person and physically examine for your progress to see if we needed to do 3 more months of treatment.  If we continue treatment we will repeat the lab work at that time.  We will not do any other monitoring labs in the interim, if you have any abdominal pain especially to the right upper quadrant or under your right ribs please notify me immediately we will need to see you sooner and check labs.   Fungal Nail Infection A fungal nail infection is a common infection of the toenails or fingernails. This condition affects toenails more often than fingernails. It often affects the great, or big, toes. More than one nail may be infected. The condition can be passed from person to person (is contagious). What are the causes? This condition is caused by a fungus. Several types of fungi can cause the infection. These fungi are common in moist and warm areas. If your hands or feet come into contact with the fungus, it may get into a crack in your fingernail or toenail and cause the infection. What increases the risk? The following factors may make you more likely to develop this condition:  Being female.  Being of older age.  Living with someone who has the fungus.  Walking barefoot in areas where the fungus thrives, such as showers or locker rooms.  Wearing shoes and socks that cause your feet to sweat.  Having a nail injury or a recent nail surgery.  Having certain  medical conditions, such as: ? Athlete's foot. ? Diabetes. ? Psoriasis. ? Poor circulation. ? A weak body defense system (immune system). What are the signs or symptoms? Symptoms of this condition include:  A pale spot on the nail.  Thickening of the nail.  A nail that becomes yellow or brown.  A brittle or ragged nail edge.  A crumbling nail.  A nail that has lifted away from the nail bed. How is this diagnosed? This condition is diagnosed with a physical exam. Your health care provider may take a scraping or clipping from your nail to test for the fungus. How is this treated? Treatment is not needed for mild infections. If you have significant nail changes, treatment may include:  Antifungal medicines taken by mouth (orally). You may need to take the medicine for several weeks or several months, and you may not see the results for a long time. These medicines can cause side effects. Ask your health care provider what problems to watch for.  Antifungal nail polish or nail cream. These may be used along with oral antifungal medicines.  Laser treatment of the nail.  Surgery to remove the nail. This may be needed for the most severe infections. It can take a long time, usually up to a year, for the infection to go away. The infection may also come back. Follow these instructions at home: Medicines  Take or apply  over-the-counter and prescription medicines only as told by your health care provider.  Ask your health care provider about using over-the-counter mentholated ointment on your nails. Nail care  Trim your nails often.  Wash and dry your hands and feet every day.  Keep your feet dry: ? Wear absorbent socks, and change your socks frequently. ? Wear shoes that allow air to circulate, such as sandals or canvas tennis shoes. Throw out old shoes.  Do not use artificial nails.  If you go to a nail salon, make sure you choose one that uses clean instruments.  Use  antifungal foot powder on your feet and in your shoes. General instructions  Do not share personal items, such as towels or nail clippers.  Do not walk barefoot in shower rooms or locker rooms.  Wear rubber gloves if you are working with your hands in wet areas.  Keep all follow-up visits as told by your health care provider. This is important. Contact a health care provider if: Your infection is not getting better or it is getting worse after several months. Summary  A fungal nail infection is a common infection of the toenails or fingernails.  Treatment is not needed for mild infections. If you have significant nail changes, treatment may include taking medicine orally and applying medicine to your nails.  It can take a long time, usually up to a year, for the infection to go away. The infection may also come back.  Take or apply over-the-counter and prescription medicines only as told by your health care provider.  Follow instructions for taking care of your nails to help prevent infection from coming back or spreading. This information is not intended to replace advice given to you by your health care provider. Make sure you discuss any questions you have with your health care provider. Document Revised: 07/11/2018 Document Reviewed: 08/24/2017 Elsevier Patient Education  [place].

## 2019-04-18 NOTE — Progress Notes (Signed)
Name: Jamie Hardin   MRN: [id number]    DOB: [birth year]   Date:04/18/2019       Progress Note  Subjective:    Chief Complaint  Chief Complaint  Patient presents with  . Nail Problem    over 1 year from pedicure has been on otc liquid for 1 year.  Wants to see about getting rx    I connected with  [name]  on 04/18/19 at 10:00 AM EST by a video enabled telemedicine application and verified that I am speaking with the correct person using two identifiers.  I discussed the limitations of evaluation and management by telemedicine and the availability of in person appointments. The patient expressed understanding and agreed to proceed. Staff also discussed with the patient that there may be a patient responsible charge related to this service. Patient Location: car parked at work, private Provider Location: [facility] Additional Individuals present: none  HPI Onychomycosis to right great toenail and 5th toenail for over the past year after having a pedicure she has tried treatment at home also for the past year with topical medicines to her nails they do look somewhat improved but there was some breaking on her great toenail about halfway down the nail, her nail does have continued thickness and white and yellow and she would like to do oral treatment for it to get it to clear up all the way.  She has not been seen in person here and on lab work for most of the last year, she does see medical weight management for bariatric surgery and they do a lot of labs she states so she says they should be "on file."  In reviewing care everywhere through [facility] last labs were about 10 months ago and she has been overdue for their labs in September she states.  She will get those done this week  Works 2 jobs, hard to do appt or get labs done, weekdays works hours 8-5, need labs with other hours available  No hx of elevated LFT's alcohol abuse   Patient Active Problem List   Diagnosis Date Noted    . Hot flashes 07/25/2018  . Onychomycosis 07/25/2018  . DDD (degenerative disc disease), cervical 07/11/2017  . Pain in joint involving ankle and foot 07/11/2017  . Low back pain 07/11/2017  . 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 Influenza vaccination declined by patient 01/15/2017  . Contusion of right knee 11/15/2016  . Subluxation of shoulder joint 11/15/2016  . Abnormal mammogram of both breasts 02/04/2016  . History of pneumonia as indication for 23-polyvalent pneumococcal polysaccharide vaccine 01/14/2016  . Preventative health care 01/14/2016  . Facial pain, atypical 04/27/2015  . Eustachian tube dysfunction 04/27/2015  . 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 Abnormal thyroid function test 02/15/2015  . Rash 02/15/2015  . Breast cancer screening 01/12/2015  . Elevated blood-pressure reading without diagnosis of hypertension 01/12/2015  . GAD (generalized anxiety disorder) 01/12/2015  . Obstructive sleep apnea on CPAP 01/12/2015  . Allergy   . Asthma   . Morbid obesity ([place])   . Dysthymic disorder   . Peripheral neuropathy   . GERD (gastroesophageal reflux disease)   . Fibromyalgia 07/22/2014  . 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 Morbid obesity with BMI of 50.0-59.9, adult ([place]) 07/22/2014    Social History   Tobacco Use  . Smoking status: Former Smoker    Packs/day:  1.00    Years: 20.00    Pack years: 20.00    Types: Cigarettes    Quit date: 04/04/2003    Years since quitting: 16.0  . Smokeless tobacco: Never Used  Substance Use Topics  . Alcohol use: Yes    Comment: Very Rare     Current Outpatient Medications:  .  albuterol (ACCUNEB) 0.63 MG/3ML nebulizer solution, USE 1 VIAL VIA NEBULIZER EVERY 4 HOURS AS NEEDED FOR WHEEZING, Disp: 75 mL, Rfl: 3 .  albuterol (VENTOLIN HFA) 108 (90 Base) MCG/ACT inhaler, Inhale 2 puffs into the lungs every 4 (four) hours as needed  for wheezing or shortness of breath., Disp: 6.7 g, Rfl: 0 .  Beta Carotene (VITAMIN A) 25000 UNIT capsule, Take 25,000 Units by mouth daily., Disp: , Rfl:  .  Biotin 1 MG CAPS, Take 1 tablet by mouth daily., Disp: , Rfl:  .  calcium-vitamin D 250-100 MG-UNIT tablet, Take 1 tablet by mouth 2 (two) times daily., Disp: , Rfl:  .  clonazePAM (KLONOPIN) 0.5 MG tablet, Take 0.5 mg by mouth 2 (two) times daily as needed., Disp: , Rfl: 0 .  ibuprofen (ADVIL) 200 MG tablet, Take 1-2 tablets (200-400 mg total) by mouth every 6 (six) hours as needed., Disp: , Rfl:  .  ipratropium (ATROVENT) 0.06 % nasal spray, Place 2 sprays into both nostrils 4 (four) times daily., Disp: 15 mL, Rfl: 12 .  Multiple Vitamins-Minerals (MULTIVITAMIN WITH MINERALS) tablet, Take 1 tablet by mouth daily., Disp: , Rfl:  .  pantoprazole (PROTONIX) 20 MG tablet, Take 20 mg by mouth daily., Disp: , Rfl:  .  thiamine (VITAMIN B-1) 100 MG tablet, Take 100 mg by mouth daily., Disp: , Rfl:  .  valACYclovir (VALTREX) 1000 MG tablet, TAKE 2 TABLETS BY MOUTH AT FIRST SIGN OF OUTBREAK FOLLOWED BY 2 TABLETS MORE 12 HOURS LATER, Disp: 4 tablet, Rfl: 5 .  vitamin B-12 (CYANOCOBALAMIN) 100 MCG tablet, Take 100 mcg by mouth daily., Disp: , Rfl:  .  vitamin E 100 UNIT capsule, Take 100 Units by mouth daily., Disp: , Rfl:  .  vitamin k 100 MCG tablet, Take 100 mcg by mouth daily., Disp: , Rfl:   Allergies  Allergen Reactions  . Codeine   . Naproxen     Review patient's last labs available in this EMR and in care everywhere through [place] labs, reviewed past office visits, allergies, med list  Review of Systems  Constitutional: Negative.   HENT: Negative.   Eyes: Negative.   Respiratory: Negative.   Cardiovascular: Negative.   Gastrointestinal: Negative.   Endocrine: Negative.   Genitourinary: Negative.   Musculoskeletal: Negative.   Skin: Negative.   Allergic/Immunologic: Negative.   Neurological: Negative.   Hematological: Negative.    Psychiatric/Behavioral: Negative.   All other systems reviewed and are negative.    Objective:   Virtual encounter, vitals limited, only able to obtain the following Today's Vitals   04/18/19 0948  BP: 117/70  Pulse: 80  Weight: 244 lb (110.7 kg)  Height: 5\' 5"  (1.651 m)   Body mass index is 40.6 kg/m. Nursing Note and Vital Signs reviewed.  Physical Exam Pt well appearing, alert, NAD PE limited by telephone encounter  No results found for this or any previous visit (from the past 72 hour(s)).  Assessment and Plan:     ICD-10-CM   1. Onychomycosis  B35.1 terbinafine (LAMISIL) 250 MG tablet   right great and 5th toenail, onset over a year ago, failed  topical, start oral after baseline labs if LFTs normal, 3 month f/up, sooner if RUQ or abd pain    Onychomycosis toenail - three months of meds after labs (if normal) pt should f/up if any concerns for RUQ pains.   3 month re-assessment, if need to continue,, will recheck labs then if continuing meds.  - I discussed the assessment and treatment plan with the patient. The patient was provided an opportunity to ask questions and all were answered. The patient agreed with the plan and demonstrated an understanding of the instructions.  I provided 12 minutes of non-face-to-face time during this encounter.  Delsa Grana, PA-C 04/18/19 10:39 AM

## 2019-04-24 DIAGNOSIS — Z713 Dietary counseling and surveillance: Secondary | ICD-10-CM | POA: Diagnosis not present

## 2019-04-24 DIAGNOSIS — K912 Postsurgical malabsorption, not elsewhere classified: Secondary | ICD-10-CM | POA: Diagnosis not present

## 2019-04-24 DIAGNOSIS — Z9884 Bariatric surgery status: Secondary | ICD-10-CM | POA: Diagnosis not present

## 2019-04-28 DIAGNOSIS — Z03818 Encounter for observation for suspected exposure to other biological agents ruled out: Secondary | ICD-10-CM | POA: Diagnosis not present

## 2019-05-06 DIAGNOSIS — H04123 Dry eye syndrome of bilateral lacrimal glands: Secondary | ICD-10-CM | POA: Diagnosis not present

## 2019-05-09 DIAGNOSIS — M5412 Radiculopathy, cervical region: Secondary | ICD-10-CM | POA: Diagnosis not present

## 2019-05-09 DIAGNOSIS — G894 Chronic pain syndrome: Secondary | ICD-10-CM | POA: Diagnosis not present

## 2019-05-09 DIAGNOSIS — M545 Low back pain: Secondary | ICD-10-CM | POA: Diagnosis not present

## 2019-05-12 ENCOUNTER — Ambulatory Visit (INDEPENDENT_AMBULATORY_CARE_PROVIDER_SITE_OTHER): Payer: BC Managed Care – PPO | Admitting: Internal Medicine

## 2019-05-12 ENCOUNTER — Encounter: Payer: Self-pay | Admitting: Internal Medicine

## 2019-05-12 DIAGNOSIS — J4520 Mild intermittent asthma, uncomplicated: Secondary | ICD-10-CM | POA: Diagnosis not present

## 2019-05-12 DIAGNOSIS — G4733 Obstructive sleep apnea (adult) (pediatric): Secondary | ICD-10-CM

## 2019-05-12 NOTE — Progress Notes (Signed)
[facility] Consultation     I connected with the patient by telephone enabled telemedicine visit and verified that I am speaking with the correct person using two identifiers.    I discussed the limitations, risks, security and privacy concerns of performing an evaluation and management service by telemedicine and the availability of in-person appointments. I also discussed with the patient that there may be a patient responsible charge related to this service. The patient expressed understanding and agreed to proceed.  PATIENT AGREES AND CONFIRMS -YES   Other persons participating in the visit and their role in the encounter: Patient, nursing  This visit type was conducted due to national recommendations for restrictions regarding the COVID-19 Pandemic (e.g. social distancing).  This format is felt to be most appropriate for this patient at this time.  All issues noted in this document were discussed and addressed.            Date: 05/12/2019  MRN# [id number] Jamie Hardin 1970-04-26    Jamie Hardin is a 49 y.o. old female seen in consultation for chief complaint of: dyspnea and cough.     CC FOLLOW UP ASTHMA   HPI:   DX OF PNEUMONIA LAST YEAR  No exacerbation at this time No evidence of heart failure at this time No evidence or signs of infection at this time No respiratory distress No fevers, chills, nausea, vomiting, diarrhea No evidence of lower extremity edema No evidence hemoptysis  LOST 80 POUNDS IN LAST YEAR ALBUTEROL AS NEEDED  CBC 01/14/16; eosinophil count equals 100.  TRIGGERS-DUST, POLLEN HAY FEVER, PERFUMES,CANDLES 1 OUTDOOR CAT, 8 DOGS IN THE HOUSE HARD WOOD FLOORS   DX WITH OSA 5 YEARS ON CPAP NASAL PILLOWS   Review of Systems:  Gen:  Denies  fever, sweats, chills weight loss  HEENT: Denies blurred vision, double vision, ear pain, eye pain, hearing loss, nose bleeds, sore throat Cardiac:  No dizziness, chest pain or  heaviness, chest tightness,edema, No JVD Resp:   No cough, -sputum production, -shortness of breath,-wheezing, -hemoptysis,  Gi: Denies swallowing difficulty, stomach pain, nausea or vomiting, diarrhea, constipation, bowel incontinence Gu:  Denies bladder incontinence, burning urine Ext:   Denies Joint pain, stiffness or swelling Skin: Denies  skin rash, easy bruising or bleeding or hives Endoc:  Denies polyuria, polydipsia , polyphagia or weight change Psych:   Denies depression, insomnia or hallucinations  Other:  All other systems negative  Social Hx:   Social History   Tobacco Use  . Smoking status: Former Smoker    Packs/day: 1.00    Years: 20.00    Pack years: 20.00    Types: Cigarettes    Quit date: 04/04/2003    Years since quitting: 16.1  . Smokeless tobacco: Never Used  Substance Use Topics  . Alcohol use: Yes    Comment: Very Rare  . Drug use: No   Medication:    Current Outpatient Medications:  .  albuterol (ACCUNEB) 0.63 MG/3ML nebulizer solution, USE 1 VIAL VIA NEBULIZER EVERY 4 HOURS AS NEEDED FOR WHEEZING, Disp: 75 mL, Rfl: 3 .  albuterol (VENTOLIN HFA) 108 (90 Base) MCG/ACT inhaler, Inhale 2 puffs into the lungs every 4 (four) hours as needed for wheezing or shortness of breath., Disp: 6.7 g, Rfl: 0 .  Beta Carotene (VITAMIN A) 25000 UNIT capsule, Take 25,000 Units by mouth daily., Disp: , Rfl:  .  Biotin 1 MG CAPS, Take 1 tablet by mouth daily., Disp: , Rfl:  .  calcium-vitamin D 250-100 MG-UNIT tablet, Take 1 tablet by mouth 2 (two) times daily., Disp: , Rfl:  .  clonazePAM (KLONOPIN) 0.5 MG tablet, Take 0.5 mg by mouth 2 (two) times daily as needed., Disp: , Rfl: 0 .  ibuprofen (ADVIL) 200 MG tablet, Take 1-2 tablets (200-400 mg total) by mouth every 6 (six) hours as needed., Disp: , Rfl:  .  ipratropium (ATROVENT) 0.06 % nasal spray, Place 2 sprays into both nostrils 4 (four) times daily., Disp: 15 mL, Rfl: 12 .  Multiple Vitamins-Minerals (MULTIVITAMIN WITH  MINERALS) tablet, Take 1 tablet by mouth daily., Disp: , Rfl:  .  pantoprazole (PROTONIX) 20 MG tablet, Take 20 mg by mouth daily., Disp: , Rfl:  .  terbinafine (LAMISIL) 250 MG tablet, Take 1 tablet (250 mg total) by mouth daily., Disp: 90 tablet, Rfl: 1 .  thiamine (VITAMIN B-1) 100 MG tablet, Take 100 mg by mouth daily., Disp: , Rfl:  .  valACYclovir (VALTREX) 1000 MG tablet, TAKE 2 TABLETS BY MOUTH AT FIRST SIGN OF OUTBREAK FOLLOWED BY 2 TABLETS MORE 12 HOURS LATER, Disp: 4 tablet, Rfl: 5 .  vitamin B-12 (CYANOCOBALAMIN) 100 MCG tablet, Take 100 mcg by mouth daily., Disp: , Rfl:  .  vitamin E 100 UNIT capsule, Take 100 Units by mouth daily., Disp: , Rfl:  .  vitamin k 100 MCG tablet, Take 100 mcg by mouth daily., Disp: , Rfl:    Allergies:  Codeine and Naproxen     ASTHMA WELL CONTROLLED ALBUTEROL AS NEEDED AVOID TRIGGERS   FOLLOW UP BARIATRIC SURGERY S/P SURGERY 1.5 YEARS AGO   OSA CONTINUE CPAP NEED COMPLIANCE REPORT and next office visit  Obesity-260 POUNDS  -recommend significant weight loss -recommend changing diet  Deconditioned state -Recommend increased daily activity and exercise    COVID-19 EDUCATION: The signs and symptoms of COVID-19 were discussed with the patient and how to seek care for testing.  The importance of social distancing was discussed today. Hand Washing Techniques and avoid touching face was advised.     MEDICATION ADJUSTMENTS/LABS AND TESTS ORDERED: AVOID ALLERGENS  CONTINUE CPAP   CURRENT MEDICATIONS REVIEWED AT LENGTH WITH PATIENT TODAY   Patient satisfied with Plan of action and management. All questions answered  Follow up in [place], M.D.  [facility]  Medical Director [place] Director [facility] Cardio-Pulmonary Department

## 2019-05-12 NOTE — Patient Instructions (Signed)
AVOID ALLERGENS  CONTINUE CPAP

## 2019-05-18 ENCOUNTER — Ambulatory Visit: Payer: BC Managed Care – PPO

## 2019-05-23 DIAGNOSIS — Z03818 Encounter for observation for suspected exposure to other biological agents ruled out: Secondary | ICD-10-CM | POA: Diagnosis not present

## 2019-06-05 DIAGNOSIS — G4733 Obstructive sleep apnea (adult) (pediatric): Secondary | ICD-10-CM | POA: Diagnosis not present

## 2019-06-05 DIAGNOSIS — M7918 Myalgia, other site: Secondary | ICD-10-CM | POA: Diagnosis not present

## 2019-06-05 DIAGNOSIS — G43719 Chronic migraine without aura, intractable, without status migrainosus: Secondary | ICD-10-CM | POA: Diagnosis not present

## 2019-06-05 DIAGNOSIS — M5481 Occipital neuralgia: Secondary | ICD-10-CM | POA: Diagnosis not present

## 2019-06-06 DIAGNOSIS — G894 Chronic pain syndrome: Secondary | ICD-10-CM | POA: Diagnosis not present

## 2019-06-06 DIAGNOSIS — M5412 Radiculopathy, cervical region: Secondary | ICD-10-CM | POA: Diagnosis not present

## 2019-06-06 DIAGNOSIS — M47814 Spondylosis without myelopathy or radiculopathy, thoracic region: Secondary | ICD-10-CM | POA: Diagnosis not present

## 2019-06-06 DIAGNOSIS — M545 Low back pain: Secondary | ICD-10-CM | POA: Diagnosis not present

## 2019-06-09 ENCOUNTER — Other Ambulatory Visit: Payer: Self-pay | Admitting: Neurology

## 2019-06-09 DIAGNOSIS — G43719 Chronic migraine without aura, intractable, without status migrainosus: Secondary | ICD-10-CM

## 2019-06-09 NOTE — Progress Notes (Signed)
Pt called 06/09/19 to arrive on 06/16/19 @0730  for an LP. Instructed pt to have someone drive her home. Pt stated that she does not take blood thinners. Pt does not need urine pregnancy d/t hysterectomy.

## 2019-06-16 ENCOUNTER — Ambulatory Visit
Admission: RE | Admit: 2019-06-16 | Discharge: 2019-06-16 | Disposition: A | Discharge status: Home / Self Care | Payer: BC Managed Care – PPO | Source: Ambulatory Visit | Attending: Neurology | Admitting: Neurology

## 2019-06-16 DIAGNOSIS — G43719 Chronic migraine without aura, intractable, without status migrainosus: Secondary | ICD-10-CM | POA: Insufficient documentation

## 2019-06-16 LAB — CSF CELL COUNT WITH DIFFERENTIAL
RBC Count, CSF: 271 /mm3 — ABNORMAL HIGH (ref 0–3)
Tube #: 3
WBC, CSF: 0 /mm3 (ref 0–5)

## 2019-06-16 LAB — PROTEIN, CSF: Total  Protein, CSF: 22 mg/dL (ref 15–45)

## 2019-06-16 LAB — CBC
HCT: 39.4 % (ref 36.0–46.0)
Hemoglobin: 12.9 g/dL (ref 12.0–15.0)
MCH: 28.4 pg (ref 26.0–34.0)
MCHC: 32.7 g/dL (ref 30.0–36.0)
MCV: 86.6 fL (ref 80.0–100.0)
Platelets: 220 10*3/uL (ref 150–400)
RBC: 4.55 MIL/uL (ref 3.87–5.11)
RDW: 12.8 % (ref 11.5–15.5)
WBC: 5.1 10*3/uL (ref 4.0–10.5)
nRBC: 0 % (ref 0.0–0.2)

## 2019-06-16 LAB — PROTIME-INR
INR: 1 (ref 0.8–1.2)
Prothrombin Time: 13.4 seconds (ref 11.4–15.2)

## 2019-06-16 LAB — GLUCOSE, RANDOM: Glucose, Bld: 86 mg/dL (ref 70–99)

## 2019-06-16 LAB — GLUCOSE, CSF: Glucose, CSF: 51 mg/dL (ref 40–70)

## 2019-06-16 LAB — APTT: aPTT: 36 seconds (ref 24–36)

## 2019-06-16 MED ORDER — ACETAMINOPHEN 500 MG PO TABS
1000.0000 mg | ORAL_TABLET | Freq: Four times a day (QID) | ORAL | Status: DC | PRN
  Administered 2019-06-16: 1000 mg via ORAL

## 2019-06-16 MED ORDER — ACETAMINOPHEN 500 MG PO TABS
ORAL_TABLET | ORAL | Status: AC
  Filled 2019-06-16: qty 2

## 2019-06-17 LAB — IGG CSF INDEX
Albumin CSF-mCnc: 12 mg/dL (ref 11–48)
Albumin: 4.2 g/dL (ref 3.8–4.8)
CSF IgG Index: 0.5 (ref 0.0–0.7)
IgG (Immunoglobin G), Serum: 676 mg/dL (ref 586–1602)
IgG, CSF: 1 mg/dL (ref 0.0–8.6)
IgG/Alb Ratio, CSF: 0.08 (ref 0.00–0.25)

## 2019-06-18 DIAGNOSIS — G4733 Obstructive sleep apnea (adult) (pediatric): Secondary | ICD-10-CM | POA: Diagnosis not present

## 2019-06-18 LAB — OLIGOCLONAL BANDS, CSF + SERM

## 2019-06-23 DIAGNOSIS — G43719 Chronic migraine without aura, intractable, without status migrainosus: Secondary | ICD-10-CM | POA: Diagnosis not present

## 2019-07-09 DIAGNOSIS — G43719 Chronic migraine without aura, intractable, without status migrainosus: Secondary | ICD-10-CM | POA: Diagnosis not present

## 2019-07-17 ENCOUNTER — Encounter: Payer: Self-pay | Admitting: Family Medicine

## 2019-07-17 ENCOUNTER — Ambulatory Visit: Payer: BC Managed Care – PPO | Admitting: Family Medicine

## 2019-07-17 ENCOUNTER — Other Ambulatory Visit: Payer: Self-pay

## 2019-07-17 VITALS — BP 128/78 | HR 97 | Temp 97.1°F | Resp 14 | Ht 65.0 in | Wt 284.1 lb

## 2019-07-17 DIAGNOSIS — R519 Headache, unspecified: Secondary | ICD-10-CM

## 2019-07-17 DIAGNOSIS — B351 Tinea unguium: Secondary | ICD-10-CM | POA: Diagnosis not present

## 2019-07-17 DIAGNOSIS — G43719 Chronic migraine without aura, intractable, without status migrainosus: Secondary | ICD-10-CM

## 2019-07-17 MED ORDER — SUMATRIPTAN SUCCINATE 50 MG PO TABS: 50.0000 mg | ORAL_TABLET | ORAL | 2 refills | Status: AC | PRN

## 2019-07-17 NOTE — Patient Instructions (Signed)
Imitrex.  Can use 25-100 mg by mouth once at the onset of headache, and if it doesn't resolve can take a second dose one hour later.  Max dose in 24 hours is 200 mg

## 2019-07-17 NOTE — Progress Notes (Signed)
Name: Jamie Hardin   MRN: [id number]    DOB: [birth year]   Date:07/17/2019       Progress Note  Chief Complaint  Patient presents with  . Nail Problem    Onychomycosis, terbinafine working well for patient     Subjective:   Jamie Hardin is a 49 y.o. female, presents to clinic for routine follow up on the conditions listed above.  Nail fungal disease - here for f/up, her nails are getting better.  She has been taking terbinafine for 3 months w/o SE or concerns.  Her thickened yellow nails have grown out quite a bit and there is mostly healthy nail.  Several mm remaining to the distal edge of a few toenails.    Neurologist - would like a new referral for hx of HA's she believes she has IIH, she is requesting referral for a second opinion.  She has many years hx of HA's, has had MRI recently, seeing neurology but she says they are not doing anything for her, even after MRIs and multiple visits with reporting severe frequent HA's there has been no change to her tx or no attempts to get new or additional medicines.  She reports having a spinal tap in the past that was negative.   Pt wishes to get referral to [facility] in [place], she has called them herself to see if she can get an appt, currently seeing [facility] which is part of [facility] as well.  She reports HA's at least 2-3 x a week  Tried toradol, no other Rx meds  [phone] fax for referral with the PA    Patient Active Problem List   Diagnosis Date Noted  . Hot flashes 07/25/2018  . Onychomycosis 07/25/2018  . DDD (degenerative disc disease), cervical 07/11/2017  . Pain in joint involving ankle and foot 07/11/2017  . Low back pain 07/11/2017  . 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 Influenza vaccination declined by patient 01/15/2017  . Contusion of right knee 11/15/2016  . Subluxation of shoulder joint 11/15/2016  . Abnormal mammogram of both breasts 02/04/2016  . History  of pneumonia as indication for 23-polyvalent pneumococcal polysaccharide vaccine 01/14/2016  . Preventative health care 01/14/2016  . Facial pain, atypical 04/27/2015  . Eustachian tube dysfunction 04/27/2015  . 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 Abnormal thyroid function test 02/15/2015  . Rash 02/15/2015  . Breast cancer screening 01/12/2015  . Elevated blood-pressure reading without diagnosis of hypertension 01/12/2015  . GAD (generalized anxiety disorder) 01/12/2015  . Obstructive sleep apnea on CPAP 01/12/2015  . Allergy   . Asthma   . Morbid obesity ([place])   . Dysthymic disorder   . Peripheral neuropathy   . GERD (gastroesophageal reflux disease)   . Fibromyalgia 07/22/2014  . 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 Morbid obesity with BMI of 50.0-59.9, adult ([place]) 07/22/2014    Past Surgical History:  Procedure Laterality Date  . ABDOMINAL HYSTERECTOMY  2007   complete due to endometriosis  . CHOLECYSTECTOMY  01/2018  . 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 NASAL SINUS SURGERY  1996 and 1999   x 2    Family History  Problem Relation Age of Onset  . Fibromyalgia Mother   . Diabetes Mother   . Cancer Father        prostate  . Diabetes Maternal Aunt   . Diabetes Paternal Aunt   .  Scleroderma Maternal Grandmother   . Heart disease Maternal Grandfather   . Hypertension Neg Hx   . Hyperlipidemia Neg Hx   . Stroke Neg Hx   . COPD Neg Hx     Social History   Tobacco Use  . Smoking status: Former Smoker    Packs/day: 1.00    Years: 20.00    Pack years: 20.00    Types: Cigarettes    Quit date: 04/04/2003    Years since quitting: 16.2  . Smokeless tobacco: Never Used  Substance Use Topics  . Alcohol use: Yes    Comment: Very Rare  . Drug use: No      Current Outpatient Medications:  .  albuterol (ACCUNEB) 0.63 MG/3ML nebulizer solution, USE 1 VIAL VIA NEBULIZER EVERY 4 HOURS AS NEEDED FOR WHEEZING,  Disp: 75 mL, Rfl: 3 .  albuterol (VENTOLIN HFA) 108 (90 Base) MCG/ACT inhaler, Inhale 2 puffs into the lungs every 4 (four) hours as needed for wheezing or shortness of breath., Disp: 6.7 g, Rfl: 0 .  calcium-vitamin D 250-100 MG-UNIT tablet, Take 1 tablet by mouth 2 (two) times daily., Disp: , Rfl:  .  clonazePAM (KLONOPIN) 0.5 MG tablet, Take 0.5 mg by mouth 2 (two) times daily as needed., Disp: , Rfl: 0 .  ibuprofen (ADVIL) 200 MG tablet, Take 1-2 tablets (200-400 mg total) by mouth every 6 (six) hours as needed., Disp: , Rfl:  .  ipratropium (ATROVENT) 0.06 % nasal spray, Place 2 sprays into both nostrils 4 (four) times daily., Disp: 15 mL, Rfl: 12 .  Multiple Vitamins-Minerals (MULTIVITAMIN WITH MINERALS) tablet, Take 1 tablet by mouth daily., Disp: , Rfl:  .  pantoprazole (PROTONIX) 20 MG tablet, Take 20 mg by mouth daily., Disp: , Rfl:  .  terbinafine (LAMISIL) 250 MG tablet, Take 1 tablet (250 mg total) by mouth daily., Disp: 90 tablet, Rfl: 1 .  valACYclovir (VALTREX) 1000 MG tablet, TAKE 2 TABLETS BY MOUTH AT FIRST SIGN OF OUTBREAK FOLLOWED BY 2 TABLETS MORE 12 HOURS LATER, Disp: 4 tablet, Rfl: 5 .  Beta Carotene (VITAMIN A) 25000 UNIT capsule, Take 25,000 Units by mouth daily., Disp: , Rfl:  .  Biotin 1 MG CAPS, Take 1 tablet by mouth daily., Disp: , Rfl:  .  thiamine (VITAMIN B-1) 100 MG tablet, Take 100 mg by mouth daily., Disp: , Rfl:  .  vitamin B-12 (CYANOCOBALAMIN) 100 MCG tablet, Take 100 mcg by mouth daily., Disp: , Rfl:  .  vitamin E 100 UNIT capsule, Take 100 Units by mouth daily., Disp: , Rfl:  .  vitamin k 100 MCG tablet, Take 100 mcg by mouth daily., Disp: , Rfl:   Allergies  Allergen Reactions  . Codeine   . Naproxen     Chart Review Today: I personally reviewed active problem list, medication list, allergies, family history, social history, health maintenance, notes from last encounter, lab results, imaging with the patient/caregiver today.   Review of Systems    10 Systems reviewed and are negative for acute change except as noted in the HPI.  Objective:    Vitals:   07/17/19 0800  BP: 128/78  Pulse: 97  Resp: 14  Temp: (!) 97.1 F (36.2 C)  TempSrc: Temporal  SpO2: 97%  Weight: 284 lb 1.6 oz (128.9 kg)  Height: 5\' 5"  (1.651 m)    Body mass index is 47.28 kg/m.  Physical Exam Vitals and nursing note reviewed.  Constitutional:      General: She is  not in acute distress.    Appearance: Normal appearance. She is well-developed. She is obese. She is not ill-appearing, toxic-appearing or diaphoretic.  HENT:     Head: Normocephalic and atraumatic.     Right Ear: External ear normal.     Left Ear: External ear normal.  Eyes:     General: No scleral icterus.       Right eye: No discharge.        Left eye: No discharge.     Conjunctiva/sclera: Conjunctivae normal.  Neck:     Trachea: No tracheal deviation.  Cardiovascular:     Rate and Rhythm: Normal rate.     Pulses: Normal pulses.     Heart sounds: Normal heart sounds.  Pulmonary:     Effort: Pulmonary effort is normal. No respiratory distress.     Breath sounds: Normal breath sounds. No stridor.  Musculoskeletal:     Right lower leg: No edema.     Left lower leg: No edema.  Feet:     Right foot:     Toenail Condition: Fungal disease present.    Left foot:     Toenail Condition: Fungal disease present.    Comments: Improving nail fungal disease  Skin:    Coloration: Skin is not jaundiced or pale.     Findings: No rash.  Neurological:     Mental Status: She is alert.     Cranial Nerves: No dysarthria or facial asymmetry.     Motor: Motor function is intact.     Coordination: Coordination is intact.     Gait: Gait is intact. Gait normal.  Psychiatric:        Mood and Affect: Mood normal.        Behavior: Behavior normal.       PHQ2/9: Depression screen [facility] 2/9 07/17/2019 04/18/2019 01/09/2019 01/16/2018 01/16/2018  Decreased Interest 0 0 0 0 0  Down, Depressed,  Hopeless 0 0 0 0 0  PHQ - 2 Score 0 0 0 0 0  Altered sleeping 0 0 0 3 -  Tired, decreased energy 0 0 0 0 -  Change in appetite 0 0 0 0 -  Feeling bad or failure about yourself  0 0 0 0 -  Trouble concentrating 0 0 0 0 -  Moving slowly or fidgety/restless 0 0 0 0 -  Suicidal thoughts 0 0 0 0 -  PHQ-9 Score 0 0 0 3 -  Difficult doing work/chores Not difficult at all Not difficult at all Not difficult at all - -    phq 9 is neg, reviewed today  Fall Risk: Fall Risk  07/17/2019 04/18/2019 01/09/2019 07/25/2018 01/16/2018  Falls in the past year? 0 0 0 0 No  Number falls in past yr: 0 0 0 0 -  Injury with Fall? 0 0 0 - -    Functional Status Survey: Is the patient deaf or have difficulty hearing?: No Does the patient have difficulty seeing, even when wearing glasses/contacts?: No Does the patient have difficulty concentrating, remembering, or making decisions?: No Does the patient have difficulty walking or climbing stairs?: No Does the patient have difficulty dressing or bathing?: No Does the patient have difficulty doing errands alone such as visiting a doctor's office or shopping?: No   Assessment & Plan:     ICD-10-CM   1. Onychomycosis  B35.1    continue terbinafine x 3 more months, f/up PRN   2. Intractable chronic migraine without aura and without status migrainosus  [id number] Ambulatory referral to Neurology   has been seeing neuro, requests referral to other neuro provider who works with [facility]  3. Nonintractable episodic headache, unspecified headache type  R51.9 Ambulatory referral to Neurology    SUMAtriptan (IMITREX) 50 MG tablet   2nd opinion requested, has daily HA's and debilitating HA's 2-3 x a week, sent in Rx for imitrex for pt to try     Delsa Grana, PA-C 07/17/19 8:27 AM

## 2019-07-18 ENCOUNTER — Encounter: Payer: Self-pay | Admitting: Family Medicine

## 2019-07-28 ENCOUNTER — Encounter: Payer: Self-pay | Admitting: Family Medicine

## 2019-07-30 ENCOUNTER — Encounter: Payer: Self-pay | Admitting: Family Medicine

## 2019-08-20 ENCOUNTER — Ambulatory Visit: Payer: Self-pay | Admitting: Neurology

## 2019-09-16 DIAGNOSIS — G4733 Obstructive sleep apnea (adult) (pediatric): Secondary | ICD-10-CM | POA: Diagnosis not present

## 2019-09-30 DIAGNOSIS — S7002XA Contusion of left hip, initial encounter: Secondary | ICD-10-CM | POA: Diagnosis not present

## 2019-09-30 DIAGNOSIS — S8001XA Contusion of right knee, initial encounter: Secondary | ICD-10-CM | POA: Diagnosis not present

## 2019-10-16 DIAGNOSIS — Z79891 Long term (current) use of opiate analgesic: Secondary | ICD-10-CM | POA: Diagnosis not present

## 2019-10-16 DIAGNOSIS — M5412 Radiculopathy, cervical region: Secondary | ICD-10-CM | POA: Diagnosis not present

## 2019-10-16 DIAGNOSIS — M47814 Spondylosis without myelopathy or radiculopathy, thoracic region: Secondary | ICD-10-CM | POA: Diagnosis not present

## 2019-10-16 DIAGNOSIS — G894 Chronic pain syndrome: Secondary | ICD-10-CM | POA: Diagnosis not present

## 2019-10-16 DIAGNOSIS — M545 Low back pain: Secondary | ICD-10-CM | POA: Diagnosis not present

## 2019-10-21 DIAGNOSIS — R200 Anesthesia of skin: Secondary | ICD-10-CM | POA: Diagnosis not present

## 2019-10-21 DIAGNOSIS — R110 Nausea: Secondary | ICD-10-CM | POA: Diagnosis not present

## 2019-10-21 DIAGNOSIS — G43719 Chronic migraine without aura, intractable, without status migrainosus: Secondary | ICD-10-CM | POA: Diagnosis not present

## 2019-11-11 DIAGNOSIS — Z03818 Encounter for observation for suspected exposure to other biological agents ruled out: Secondary | ICD-10-CM | POA: Diagnosis not present

## 2019-11-16 ENCOUNTER — Other Ambulatory Visit: Payer: Self-pay | Admitting: Internal Medicine

## 2019-11-16 DIAGNOSIS — J4521 Mild intermittent asthma with (acute) exacerbation: Secondary | ICD-10-CM

## 2019-11-17 DIAGNOSIS — Z03818 Encounter for observation for suspected exposure to other biological agents ruled out: Secondary | ICD-10-CM | POA: Diagnosis not present

## 2019-11-18 DIAGNOSIS — Z20828 Contact with and (suspected) exposure to other viral communicable diseases: Secondary | ICD-10-CM | POA: Diagnosis not present

## 2019-11-25 DIAGNOSIS — Z03818 Encounter for observation for suspected exposure to other biological agents ruled out: Secondary | ICD-10-CM | POA: Diagnosis not present

## 2019-11-26 DIAGNOSIS — G43719 Chronic migraine without aura, intractable, without status migrainosus: Secondary | ICD-10-CM | POA: Diagnosis not present

## 2019-11-26 DIAGNOSIS — M7918 Myalgia, other site: Secondary | ICD-10-CM | POA: Diagnosis not present

## 2019-11-26 DIAGNOSIS — M542 Cervicalgia: Secondary | ICD-10-CM | POA: Diagnosis not present

## 2019-11-26 DIAGNOSIS — G609 Hereditary and idiopathic neuropathy, unspecified: Secondary | ICD-10-CM | POA: Insufficient documentation

## 2019-11-26 DIAGNOSIS — M5481 Occipital neuralgia: Secondary | ICD-10-CM | POA: Diagnosis not present

## 2019-11-26 DIAGNOSIS — F5101 Primary insomnia: Secondary | ICD-10-CM | POA: Diagnosis not present

## 2019-11-27 DIAGNOSIS — G4700 Insomnia, unspecified: Secondary | ICD-10-CM | POA: Insufficient documentation

## 2019-11-27 DIAGNOSIS — F5101 Primary insomnia: Secondary | ICD-10-CM | POA: Insufficient documentation

## 2019-12-01 DIAGNOSIS — Z03818 Encounter for observation for suspected exposure to other biological agents ruled out: Secondary | ICD-10-CM | POA: Diagnosis not present

## 2019-12-04 DIAGNOSIS — Z03818 Encounter for observation for suspected exposure to other biological agents ruled out: Secondary | ICD-10-CM | POA: Diagnosis not present

## 2019-12-08 DIAGNOSIS — J019 Acute sinusitis, unspecified: Secondary | ICD-10-CM | POA: Diagnosis not present

## 2019-12-11 DIAGNOSIS — M5412 Radiculopathy, cervical region: Secondary | ICD-10-CM | POA: Diagnosis not present

## 2019-12-11 DIAGNOSIS — M545 Low back pain: Secondary | ICD-10-CM | POA: Diagnosis not present

## 2019-12-11 DIAGNOSIS — G894 Chronic pain syndrome: Secondary | ICD-10-CM | POA: Diagnosis not present

## 2019-12-11 DIAGNOSIS — M47814 Spondylosis without myelopathy or radiculopathy, thoracic region: Secondary | ICD-10-CM | POA: Diagnosis not present

## 2019-12-16 DIAGNOSIS — Z006 Encounter for examination for normal comparison and control in clinical research program: Secondary | ICD-10-CM | POA: Diagnosis not present

## 2019-12-16 DIAGNOSIS — Z1231 Encounter for screening mammogram for malignant neoplasm of breast: Secondary | ICD-10-CM | POA: Diagnosis not present

## 2019-12-18 DIAGNOSIS — G4733 Obstructive sleep apnea (adult) (pediatric): Secondary | ICD-10-CM | POA: Diagnosis not present

## 2019-12-24 DIAGNOSIS — M79672 Pain in left foot: Secondary | ICD-10-CM | POA: Diagnosis not present

## 2019-12-24 DIAGNOSIS — M79671 Pain in right foot: Secondary | ICD-10-CM | POA: Diagnosis not present

## 2019-12-24 DIAGNOSIS — R200 Anesthesia of skin: Secondary | ICD-10-CM | POA: Diagnosis not present

## 2020-01-07 DIAGNOSIS — G43719 Chronic migraine without aura, intractable, without status migrainosus: Secondary | ICD-10-CM | POA: Diagnosis not present

## 2020-01-07 DIAGNOSIS — M7918 Myalgia, other site: Secondary | ICD-10-CM | POA: Diagnosis not present

## 2020-01-07 DIAGNOSIS — F5101 Primary insomnia: Secondary | ICD-10-CM | POA: Diagnosis not present

## 2020-01-07 DIAGNOSIS — G609 Hereditary and idiopathic neuropathy, unspecified: Secondary | ICD-10-CM | POA: Diagnosis not present

## 2020-01-07 DIAGNOSIS — M542 Cervicalgia: Secondary | ICD-10-CM | POA: Diagnosis not present

## 2020-01-07 DIAGNOSIS — M5481 Occipital neuralgia: Secondary | ICD-10-CM | POA: Diagnosis not present

## 2020-01-19 DIAGNOSIS — Z03818 Encounter for observation for suspected exposure to other biological agents ruled out: Secondary | ICD-10-CM | POA: Diagnosis not present

## 2020-01-23 DIAGNOSIS — G629 Polyneuropathy, unspecified: Secondary | ICD-10-CM | POA: Diagnosis not present

## 2020-01-23 DIAGNOSIS — G43719 Chronic migraine without aura, intractable, without status migrainosus: Secondary | ICD-10-CM | POA: Diagnosis not present

## 2020-02-16 DIAGNOSIS — Z20822 Contact with and (suspected) exposure to covid-19: Secondary | ICD-10-CM | POA: Diagnosis not present

## 2020-02-19 ENCOUNTER — Other Ambulatory Visit: Payer: Self-pay | Admitting: Family Medicine

## 2020-02-19 NOTE — Telephone Encounter (Signed)
   Notes to clinic: script is expired  Review for refill   Requested Prescriptions  Pending Prescriptions Disp Refills   valACYclovir (VALTREX) 1000 MG tablet [Pharmacy Med Name: VALACYCLOVIR 1GM TABLETS] 4 tablet 5    Sig: TAKE 2 TABLETS BY MOUTH AT FIRST SIGN OF OUTBREAK FOLLOWED BY 2 TABLETS MORE 12 HOURS LATER      Antimicrobials:  Antiviral Agents - Anti-Herpetic Passed - 02/19/2020  7:59 AM      Passed - Valid encounter within last 12 months    Recent Outpatient Visits           7 months ago Onychomycosis   [facility] Delsa Grana, PA-C   10 months ago Onychomycosis   [facility] Delsa Grana, PA-C   1 year ago [facility] Delsa Grana, PA-C   1 year ago Hot flashes   [facility] Lada, Satira Anis, MD   2 years ago Annual physical exam   [facility], Bethel Born, NP       Future Appointments             In 1 month Delsa Grana, PA-C [facility]

## 2020-02-23 DIAGNOSIS — Z03818 Encounter for observation for suspected exposure to other biological agents ruled out: Secondary | ICD-10-CM | POA: Diagnosis not present

## 2020-02-23 DIAGNOSIS — Z20822 Contact with and (suspected) exposure to covid-19: Secondary | ICD-10-CM | POA: Diagnosis not present

## 2020-02-27 DIAGNOSIS — Z20822 Contact with and (suspected) exposure to covid-19: Secondary | ICD-10-CM | POA: Diagnosis not present

## 2020-03-01 ENCOUNTER — Ambulatory Visit
Admission: EM | Admit: 2020-03-01 | Discharge: 2020-03-01 | Disposition: A | Discharge status: Home / Self Care | Payer: BC Managed Care – PPO | Attending: Emergency Medicine | Admitting: Emergency Medicine

## 2020-03-01 ENCOUNTER — Other Ambulatory Visit: Payer: Self-pay

## 2020-03-01 DIAGNOSIS — Z20822 Contact with and (suspected) exposure to covid-19: Secondary | ICD-10-CM | POA: Insufficient documentation

## 2020-03-01 DIAGNOSIS — R0982 Postnasal drip: Secondary | ICD-10-CM | POA: Diagnosis not present

## 2020-03-01 DIAGNOSIS — R0600 Dyspnea, unspecified: Secondary | ICD-10-CM | POA: Diagnosis not present

## 2020-03-01 DIAGNOSIS — M7918 Myalgia, other site: Secondary | ICD-10-CM | POA: Diagnosis not present

## 2020-03-01 DIAGNOSIS — R509 Fever, unspecified: Secondary | ICD-10-CM | POA: Diagnosis not present

## 2020-03-01 DIAGNOSIS — J069 Acute upper respiratory infection, unspecified: Secondary | ICD-10-CM | POA: Insufficient documentation

## 2020-03-01 LAB — RESP PANEL BY RT-PCR (FLU A&B, COVID) ARPGX2
Influenza A by PCR: NEGATIVE
Influenza B by PCR: NEGATIVE
SARS Coronavirus 2 by RT PCR: NEGATIVE

## 2020-03-01 MED ORDER — PROMETHAZINE-DM 6.25-15 MG/5ML PO SYRP: 5.0000 mL | ORAL_SOLUTION | Freq: Four times a day (QID) | ORAL | 0 refills | Status: DC | PRN

## 2020-03-01 MED ORDER — ONDANSETRON 8 MG PO TBDP: 8.0000 mg | ORAL_TABLET | Freq: Three times a day (TID) | ORAL | 0 refills | Status: AC | PRN

## 2020-03-01 MED ORDER — BENZONATATE 100 MG PO CAPS: 200.0000 mg | ORAL_CAPSULE | Freq: Three times a day (TID) | ORAL | 0 refills | Status: DC

## 2020-03-01 NOTE — ED Triage Notes (Signed)
Pt reports cough x 8 days then body aches and chills x 4 days. Fever has been running up to 101.  Had COVID test 3 days ago at [facility] and was negative.  Husband currently has COVID, tested positive a week ago today.  Husband has not been quarantining from her.  Open to another test today.  Fully vaccinated for COVID.   Takes Ibuprofen every 6 hours.  Most recent dose approx 1130 today.

## 2020-03-01 NOTE — Discharge Instructions (Addendum)
Your respiratory panel was negative for Covid and flu.  Use your albuterol nebulizer every 4-6 hours as needed for shortness of breath.  Use the Tessalon Perles during the day for cough.  Take them with a small sip of water.  They may give you some numbness to the base of your tongue and a metallic taste in your mouth which is normal.  Use the Promethazine DM at bedtime as it will make you sleepy.  Use the Zofran every 8 hours as needed for nausea.  Rest and drink plenty of fluids.  Use Tylenol and ibuprofen as needed for pain and fever.  If your symptoms continue then follow-up with your primary care provider.

## 2020-03-01 NOTE — ED Provider Notes (Signed)
[facility]    CSN: [id number] Arrival date & time: 03/01/20  1122      History   Chief Complaint Chief Complaint  Patient presents with  . Cough  . Fever  . Covid Exposure    HPI Jamie Hardin is a 49 y.o. female.   HPI   49 year old female here for evaluation of cough and fever.  Patient ports that she has had a cough the last 8 days and then developed a fever 4 days ago that also been associated with body aches and chills.  She has had some sinus pressure, dizziness, runny nose, burning in her chest, and nausea.  Patient denies vomiting or diarrhea.  Patient has been vaccinated against Covid but has not had a flu shot.  She was recently on a road trip with her husband who just tested positive for Covid himself.  He too has been vaccinated.  Past Medical History:  Diagnosis Date  . Allergy   . Asthma   . Chronic sinusitis   . Dysthymic disorder   . GERD (gastroesophageal reflux disease)   . 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 Obesity   . Ovarian endometriosis   . Peripheral neuropathy   . 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 Torn meniscus     Patient Active Problem List   Diagnosis Date Noted  . Hot flashes 07/25/2018  . Onychomycosis 07/25/2018  . DDD (degenerative disc disease), cervical 07/11/2017  . Pain in joint involving ankle and foot 07/11/2017  . Low back pain 07/11/2017  . 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 Influenza vaccination declined by patient 01/15/2017  . Contusion of right knee 11/15/2016  . Subluxation of shoulder joint 11/15/2016  . Abnormal mammogram of both breasts 02/04/2016  . History of pneumonia as indication for 23-polyvalent pneumococcal polysaccharide vaccine 01/14/2016  . Preventative health care 01/14/2016  . Facial pain, atypical 04/27/2015  . Eustachian tube dysfunction 04/27/2015  . 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 Abnormal  thyroid function test 02/15/2015  . Rash 02/15/2015  . Breast cancer screening 01/12/2015  . Elevated blood-pressure reading without diagnosis of hypertension 01/12/2015  . GAD (generalized anxiety disorder) 01/12/2015  . Obstructive sleep apnea on CPAP 01/12/2015  . Allergy   . Asthma   . Morbid obesity ([place])   . Dysthymic disorder   . Peripheral neuropathy   . GERD (gastroesophageal reflux disease)   . Fibromyalgia 07/22/2014  . 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 Morbid obesity with BMI of 50.0-59.9, adult ([place]) 07/22/2014    Past Surgical History:  Procedure Laterality Date  . ABDOMINAL HYSTERECTOMY  2007   complete due to endometriosis  . CHOLECYSTECTOMY  01/2018  . 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 NASAL SINUS SURGERY  1996 and 1999   x 2    OB History   No obstetric history on file.      Home Medications    Prior to Admission medications   Medication Sig Start Date End Date Taking? Authorizing Provider  albuterol (ACCUNEB) 0.63 MG/3ML nebulizer solution USE 1 VIAL VIA NEBULIZER EVERY 4 HOURS AS NEEDED FOR WHEEZING 09/16/18  Yes Laverle Hobby, MD  albuterol (VENTOLIN HFA) 108 (90 Base) MCG/ACT inhaler Inhale 2 puffs into the lungs every 4 (four) hours as needed for wheezing or shortness of breath. 10/21/18  Yes Lacy Duverney M,  PA-C  amitriptyline (ELAVIL) 10 MG tablet Take 10 mg by mouth at bedtime.   Yes [provider]  calcium-vitamin D 250-100 MG-UNIT tablet Take 1 tablet by mouth 2 (two) times daily.   Yes [provider]  ibuprofen (ADVIL) 200 MG tablet Take 1-2 tablets (200-400 mg total) by mouth every 6 (six) hours as needed. 07/25/18  Yes Lada, Satira Anis, MD  ipratropium (ATROVENT) 0.06 % nasal spray Place 2 sprays into both nostrils 4 (four) times daily. 01/09/19  Yes Delsa Grana, PA-C  Multiple Vitamins-Minerals (MULTIVITAMIN WITH MINERALS) tablet Take 1 tablet by mouth  daily.   Yes [provider]  SUMAtriptan (IMITREX) 50 MG tablet Take 1-2 tablets (50-100 mg total) by mouth every 2 (two) hours as needed for migraine. May repeat in 2 hours if headache persists or recurs. 07/17/19  Yes Delsa Grana, PA-C  valACYclovir (VALTREX) 1000 MG tablet TAKE 2 TABLETS BY MOUTH AT FIRST SIGN OF OUTBREAK FOLLOWED BY 2 TABLETS MORE 12 HOURS LATER 02/19/20  Yes Delsa Grana, PA-C  benzonatate (TESSALON) 100 MG capsule Take 2 capsules (200 mg total) by mouth every 8 (eight) hours. 03/01/20   Margarette Canada, NP  ondansetron (ZOFRAN ODT) 8 MG disintegrating tablet Take 1 tablet (8 mg total) by mouth every 8 (eight) hours as needed for nausea or vomiting. 03/01/20   Margarette Canada, NP  promethazine-dextromethorphan (PROMETHAZINE-DM) 6.25-15 MG/5ML syrup Take 5 mLs by mouth 4 (four) times daily as needed. 03/01/20   Margarette Canada, NP  clonazePAM (KLONOPIN) 0.5 MG tablet Take 0.5 mg by mouth 2 (two) times daily as needed. 05/19/15 03/01/20  [provider]    Family History Family History  Problem Relation Age of Onset  . Fibromyalgia Mother   . Diabetes Mother   . Cancer Father        prostate  . Diabetes Maternal Aunt   . Diabetes Paternal Aunt   . Scleroderma Maternal Grandmother   . Heart disease Maternal Grandfather   . Hypertension Neg Hx   . Hyperlipidemia Neg Hx   . Stroke Neg Hx   . COPD Neg Hx     Social History Social History   Tobacco Use  . Smoking status: Former Smoker    Packs/day: 1.00    Years: 20.00    Pack years: 20.00    Types: Cigarettes    Quit date: 04/04/2003    Years since quitting: 16.9  . Smokeless tobacco: Never Used  Vaping Use  . Vaping Use: Never used  Substance Use Topics  . Alcohol use: Yes    Comment: Very Rare  . Drug use: No     Allergies   Codeine and Naproxen   Review of Systems Review of Systems  Constitutional: Positive for fever. Negative for activity change and appetite change.  HENT: Positive for  congestion, ear pain, rhinorrhea and sinus pressure. Negative for sore throat.   Respiratory: Positive for cough and shortness of breath.   Cardiovascular: Negative for chest pain.  Gastrointestinal: Positive for nausea. Negative for diarrhea and vomiting.  Musculoskeletal: Positive for arthralgias and myalgias.  Skin: Negative for rash.  Neurological: Positive for dizziness. Negative for headaches.  Hematological: Negative.   Psychiatric/Behavioral: Negative.      Physical Exam Triage Vital Signs ED Triage Vitals  Enc Vitals Group     BP 03/01/20 1408 129/81     Pulse Rate 03/01/20 1408 71     Resp 03/01/20 1408 18     Temp 03/01/20 1408  98.4 F (36.9 C)     Temp Source 03/01/20 1408 Oral     SpO2 03/01/20 1408 99 %     Weight 03/01/20 1408 285 lb (129.3 kg)     Height --      Head Circumference --      Peak Flow --      Pain Score 03/01/20 1239 3     Pain Loc --      Pain Edu? --      Excl. in [place]? --    No data found.  Updated Vital Signs BP 129/81 (BP Location: Left Arm)   Pulse 71   Temp 98.4 F (36.9 C) (Oral)   Resp 18   Wt 285 lb (129.3 kg)   SpO2 99%   BMI 47.43 kg/m   Visual Acuity Right Eye Distance:   Left Eye Distance:   Bilateral Distance:    Right Eye Near:   Left Eye Near:    Bilateral Near:     Physical Exam Vitals and nursing note reviewed.  Constitutional:      General: She is not in acute distress.    Appearance: Normal appearance. She is obese. She is not toxic-appearing.  HENT:     Head: Normocephalic and atraumatic.     Right Ear: Tympanic membrane, ear canal and external ear normal. There is no impacted cerumen.     Left Ear: Tympanic membrane, ear canal and external ear normal. There is no impacted cerumen.     Nose: Congestion and rhinorrhea present.     Comments: Nasal mucosa is mildly erythematous and edematous with clear discharge.  Maxillary sinuses are tender to percussion but frontal or not.    Mouth/Throat:     Mouth:  Mucous membranes are moist.     Pharynx: Oropharynx is clear. No oropharyngeal exudate or posterior oropharyngeal erythema.     Comments: Patient has mild clear postnasal drip without erythema or injection of posterior oropharynx. Eyes:     General: No scleral icterus.    Extraocular Movements: Extraocular movements intact.     Conjunctiva/sclera: Conjunctivae normal.     Pupils: Pupils are equal, round, and reactive to light.  Cardiovascular:     Rate and Rhythm: Normal rate and regular rhythm.     Pulses: Normal pulses.     Heart sounds: Normal heart sounds. No murmur heard.  No gallop.   Pulmonary:     Effort: Pulmonary effort is normal.     Breath sounds: Normal breath sounds. No wheezing, rhonchi or rales.  Musculoskeletal:        General: No swelling or tenderness. Normal range of motion.     Cervical back: Normal range of motion and neck supple.  Lymphadenopathy:     Cervical: No cervical adenopathy.  Skin:    General: Skin is warm and dry.     Capillary Refill: Capillary refill takes less than 2 seconds.     Findings: No erythema or rash.  Neurological:     General: No focal deficit present.     Mental Status: She is alert and oriented to person, place, and time.  Psychiatric:        Mood and Affect: Mood normal.        Behavior: Behavior normal.        Thought Content: Thought content normal.        Judgment: Judgment normal.      UC Treatments / Results  Labs (all labs ordered are listed, but  only abnormal results are displayed) Labs Reviewed  RESP PANEL BY RT-PCR (FLU A&B, COVID) [id number]    EKG   Radiology No results found.  Procedures Procedures (including critical care time)  Medications Ordered in UC Medications - No data to display  Initial Impression / Assessment and Plan / UC Course  I have reviewed the triage vital signs and the nursing notes.  Pertinent labs & imaging results that were available during my care of the patient were reviewed  by me and considered in my medical decision making (see chart for details).   Patient is here for evaluation of Covid-like symptoms.  Patient has been fully vaccinated, as is her husband but her husband just turned up positive and they were on a road trip for 10 hours together.  Patient is nontoxic-appearing.  Will send [facility] respiratory panel.  If patient is Covid positive will refer her to the infusion clinic for monoclonal antibody infusion and will treat her symptomatically with Tessalon Perles, Promethazine DM, albuterol inhaler with spacer.  Will give Zofran for nausea.  [facility] respiratory panel is negative.   Final Clinical Impressions(s) / UC Diagnoses   Final diagnoses:  Viral URI with cough     Discharge Instructions     Your respiratory panel was negative for Covid and flu.  Use your albuterol nebulizer every 4-6 hours as needed for shortness of breath.  Use the Tessalon Perles during the day for cough.  Take them with a small sip of water.  They may give you some numbness to the base of your tongue and a metallic taste in your mouth which is normal.  Use the Promethazine DM at bedtime as it will make you sleepy.  Use the Zofran every 8 hours as needed for nausea.  Rest and drink plenty of fluids.  Use Tylenol and ibuprofen as needed for pain and fever.  If your symptoms continue then follow-up with your primary care provider.    ED Prescriptions    Medication Sig Dispense Auth. Provider   benzonatate (TESSALON) 100 MG capsule Take 2 capsules (200 mg total) by mouth every 8 (eight) hours. 21 capsule Margarette Canada, NP   promethazine-dextromethorphan (PROMETHAZINE-DM) 6.25-15 MG/5ML syrup Take 5 mLs by mouth 4 (four) times daily as needed. 118 mL Margarette Canada, NP   ondansetron (ZOFRAN ODT) 8 MG disintegrating tablet Take 1 tablet (8 mg total) by mouth every 8 (eight) hours as needed for nausea or vomiting. 20 tablet Margarette Canada, NP     PDMP not reviewed this  encounter.   Margarette Canada, NP 03/01/20 1507

## 2020-03-02 DIAGNOSIS — M542 Cervicalgia: Secondary | ICD-10-CM | POA: Diagnosis not present

## 2020-03-02 DIAGNOSIS — G894 Chronic pain syndrome: Secondary | ICD-10-CM | POA: Diagnosis not present

## 2020-03-02 DIAGNOSIS — M47814 Spondylosis without myelopathy or radiculopathy, thoracic region: Secondary | ICD-10-CM | POA: Diagnosis not present

## 2020-03-02 DIAGNOSIS — M5412 Radiculopathy, cervical region: Secondary | ICD-10-CM | POA: Diagnosis not present

## 2020-03-07 ENCOUNTER — Ambulatory Visit (INDEPENDENT_AMBULATORY_CARE_PROVIDER_SITE_OTHER): Payer: BC Managed Care – PPO

## 2020-03-07 ENCOUNTER — Ambulatory Visit
Admission: EM | Admit: 2020-03-07 | Discharge: 2020-03-07 | Disposition: A | Discharge status: Home / Self Care | Payer: BC Managed Care – PPO | Attending: Family Medicine | Admitting: Family Medicine

## 2020-03-07 ENCOUNTER — Encounter: Payer: Self-pay | Admitting: Emergency Medicine

## 2020-03-07 ENCOUNTER — Other Ambulatory Visit: Payer: Self-pay

## 2020-03-07 DIAGNOSIS — Z20822 Contact with and (suspected) exposure to covid-19: Secondary | ICD-10-CM | POA: Insufficient documentation

## 2020-03-07 DIAGNOSIS — R059 Cough, unspecified: Secondary | ICD-10-CM | POA: Diagnosis not present

## 2020-03-07 DIAGNOSIS — J45901 Unspecified asthma with (acute) exacerbation: Secondary | ICD-10-CM | POA: Diagnosis not present

## 2020-03-07 DIAGNOSIS — R0602 Shortness of breath: Secondary | ICD-10-CM | POA: Diagnosis not present

## 2020-03-07 DIAGNOSIS — Z79899 Other long term (current) drug therapy: Secondary | ICD-10-CM | POA: Diagnosis not present

## 2020-03-07 DIAGNOSIS — R053 Chronic cough: Secondary | ICD-10-CM | POA: Insufficient documentation

## 2020-03-07 DIAGNOSIS — R0789 Other chest pain: Secondary | ICD-10-CM | POA: Diagnosis not present

## 2020-03-07 DIAGNOSIS — R918 Other nonspecific abnormal finding of lung field: Secondary | ICD-10-CM | POA: Diagnosis not present

## 2020-03-07 DIAGNOSIS — Z87891 Personal history of nicotine dependence: Secondary | ICD-10-CM | POA: Diagnosis not present

## 2020-03-07 DIAGNOSIS — J9811 Atelectasis: Secondary | ICD-10-CM | POA: Diagnosis not present

## 2020-03-07 DIAGNOSIS — Z8701 Personal history of pneumonia (recurrent): Secondary | ICD-10-CM | POA: Diagnosis not present

## 2020-03-07 LAB — RESP PANEL BY RT-PCR (FLU A&B, COVID) ARPGX2
Influenza A by PCR: NEGATIVE
Influenza B by PCR: NEGATIVE
SARS Coronavirus 2 by RT PCR: NEGATIVE

## 2020-03-07 MED ORDER — AMOXICILLIN-POT CLAVULANATE 875-125 MG PO TABS: 1.0000 | ORAL_TABLET | Freq: Two times a day (BID) | ORAL | 0 refills | Status: DC

## 2020-03-07 MED ORDER — HYDROCOD POLST-CPM POLST ER 10-8 MG/5ML PO SUER: 5.0000 mL | Freq: Two times a day (BID) | ORAL | 0 refills | Status: DC | PRN

## 2020-03-07 MED ORDER — PREDNISONE 50 MG PO TABS: ORAL_TABLET | ORAL | 0 refills | Status: DC

## 2020-03-07 MED ORDER — DOXYCYCLINE HYCLATE 100 MG PO CAPS: 100.0000 mg | ORAL_CAPSULE | Freq: Two times a day (BID) | ORAL | 0 refills | Status: DC

## 2020-03-07 MED ORDER — ALBUTEROL SULFATE HFA 108 (90 BASE) MCG/ACT IN AERS: 2.0000 | INHALATION_SPRAY | Freq: Four times a day (QID) | RESPIRATORY_TRACT | 0 refills | Status: DC | PRN

## 2020-03-07 NOTE — ED Triage Notes (Signed)
Patient c/o cough and chest congestion that started over a week ago.  Patient denies recent fevers.

## 2020-03-07 NOTE — Discharge Instructions (Signed)
Medication as prescribed.  Take care  Dr. Dyron Kawano  

## 2020-03-07 NOTE — ED Provider Notes (Signed)
[facility]    CSN: [id number] Arrival date & time: 03/07/20  1044      History   Chief Complaint Chief Complaint  Patient presents with  . Cough   HPI  49 year old female presents with persistent cough.  Patient was seen here on 11/29.  Testing was negative.  She was discharged home with [facility].  Promethazine DM as well.  At that visit, patient had been sick for 8 days.  Patient presents today with persisting cough and associated congestion.  She reports her cough is slightly productive.  Associated chest tightness.  Pain 3/10 in severity.  No documented fever in the past few days.  She reports associated shortness of breath.  Patient states that she has a history of pneumonia and is concerned that she may have pneumonia.  Past Medical History:  Diagnosis Date  . Allergy   . Asthma   . Chronic sinusitis   . Dysthymic disorder   . GERD (gastroesophageal reflux disease)   . 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 Obesity   . Ovarian endometriosis   . Peripheral neuropathy   . 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 Torn meniscus     Patient Active Problem List   Diagnosis Date Noted  . Hot flashes 07/25/2018  . Onychomycosis 07/25/2018  . DDD (degenerative disc disease), cervical 07/11/2017  . Pain in joint involving ankle and foot 07/11/2017  . Low back pain 07/11/2017  . 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 Influenza vaccination declined by patient 01/15/2017  . Contusion of right knee 11/15/2016  . Subluxation of shoulder joint 11/15/2016  . Abnormal mammogram of both breasts 02/04/2016  . History of pneumonia as indication for 23-polyvalent pneumococcal polysaccharide vaccine 01/14/2016  . Preventative health care 01/14/2016  . Facial pain, atypical 04/27/2015  . Eustachian tube dysfunction 04/27/2015  . 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 Abnormal thyroid function test  02/15/2015  . Rash 02/15/2015  . Breast cancer screening 01/12/2015  . Elevated blood-pressure reading without diagnosis of hypertension 01/12/2015  . GAD (generalized anxiety disorder) 01/12/2015  . Obstructive sleep apnea on CPAP 01/12/2015  . Allergy   . Asthma   . Morbid obesity ([place])   . Dysthymic disorder   . Peripheral neuropathy   . GERD (gastroesophageal reflux disease)   . Fibromyalgia 07/22/2014  . 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 Morbid obesity with BMI of 50.0-59.9, adult ([place]) 07/22/2014    Past Surgical History:  Procedure Laterality Date  . ABDOMINAL HYSTERECTOMY  2007   complete due to endometriosis  . CHOLECYSTECTOMY  01/2018  . 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 NASAL SINUS SURGERY  1996 and 1999   x 2    OB History   No obstetric history on file.      Home Medications    Prior to Admission medications   Medication Sig Start Date End Date Taking? Authorizing Provider  albuterol (ACCUNEB) 0.63 MG/3ML nebulizer solution USE 1 VIAL VIA NEBULIZER EVERY 4 HOURS AS NEEDED FOR WHEEZING 09/16/18   Laverle Hobby, MD  albuterol (VENTOLIN HFA) 108 (90 Base) MCG/ACT inhaler Inhale 2 puffs into the lungs every 6 (six) hours as needed for wheezing or shortness of breath. 03/07/20   Coral Spikes, DO  amitriptyline (ELAVIL) 10 MG tablet Take 10 mg by mouth at bedtime.  [provider]  amoxicillin-clavulanate (AUGMENTIN) 875-125 MG tablet Take 1 tablet by mouth 2 (two) times daily. 03/07/20   Coral Spikes, DO  benzonatate (TESSALON) 100 MG capsule Take 2 capsules (200 mg total) by mouth every 8 (eight) hours. 03/01/20   Margarette Canada, NP  calcium-vitamin D 250-100 MG-UNIT tablet Take 1 tablet by mouth 2 (two) times daily.    [provider]  chlorpheniramine-HYDROcodone (TUSSIONEX PENNKINETIC ER) 10-8 MG/5ML SUER Take 5 mLs by mouth every 12 (twelve) hours as needed. 03/07/20    Coral Spikes, DO  doxycycline (VIBRAMYCIN) 100 MG capsule Take 1 capsule (100 mg total) by mouth 2 (two) times daily. 03/07/20   Coral Spikes, DO  ibuprofen (ADVIL) 200 MG tablet Take 1-2 tablets (200-400 mg total) by mouth every 6 (six) hours as needed. 07/25/18   Arnetha Courser, MD  ipratropium (ATROVENT) 0.06 % nasal spray Place 2 sprays into both nostrils 4 (four) times daily. 01/09/19   Delsa Grana, PA-C  Multiple Vitamins-Minerals (MULTIVITAMIN WITH MINERALS) tablet Take 1 tablet by mouth daily.    [provider]  ondansetron (ZOFRAN ODT) 8 MG disintegrating tablet Take 1 tablet (8 mg total) by mouth every 8 (eight) hours as needed for nausea or vomiting. 03/01/20   Margarette Canada, NP  predniSONE (DELTASONE) 50 MG tablet 1 tablet daily x 5 days 03/07/20   Coral Spikes, DO  SUMAtriptan (IMITREX) 50 MG tablet Take 1-2 tablets (50-100 mg total) by mouth every 2 (two) hours as needed for migraine. May repeat in 2 hours if headache persists or recurs. 07/17/19   Delsa Grana, PA-C  valACYclovir (VALTREX) 1000 MG tablet TAKE 2 TABLETS BY MOUTH AT FIRST SIGN OF OUTBREAK FOLLOWED BY 2 TABLETS MORE 12 HOURS LATER 02/19/20   Delsa Grana, PA-C  clonazePAM (KLONOPIN) 0.5 MG tablet Take 0.5 mg by mouth 2 (two) times daily as needed. 05/19/15 03/01/20  [provider]    Family History Family History  Problem Relation Age of Onset  . Fibromyalgia Mother   . Diabetes Mother   . Cancer Father        prostate  . Diabetes Maternal Aunt   . Diabetes Paternal Aunt   . Scleroderma Maternal Grandmother   . Heart disease Maternal Grandfather   . Hypertension Neg Hx   . Hyperlipidemia Neg Hx   . Stroke Neg Hx   . COPD Neg Hx     Social History Social History   Tobacco Use  . Smoking status: Former Smoker    Packs/day: 1.00    Years: 20.00    Pack years: 20.00    Types: Cigarettes    Quit date: 04/04/2003    Years since quitting: 16.9  . Smokeless tobacco: Never Used  Vaping Use    . Vaping Use: Never used  Substance Use Topics  . Alcohol use: Yes    Comment: Very Rare  . Drug use: No     Allergies   Codeine and Naproxen   Review of Systems Review of Systems  Respiratory: Positive for cough, chest tightness and shortness of breath.    Physical Exam Triage Vital Signs ED Triage Vitals  Enc Vitals Group     BP 03/07/20 1233 112/83     Pulse Rate 03/07/20 1233 86     Resp 03/07/20 1233 16     Temp 03/07/20 1233 98.2 F (36.8 C)     Temp Source 03/07/20 1233 Oral     SpO2 03/07/20  1233 97 %     Weight 03/07/20 1230 280 lb (127 kg)     Height 03/07/20 1230 5\' 5"  (1.651 m)     Head Circumference --      Peak Flow --      Pain Score 03/07/20 1230 3     Pain Loc --      Pain Edu? --      Excl. in [place]? --    Updated Vital Signs BP 112/83 (BP Location: Left Arm)   Pulse 86   Temp 98.2 F (36.8 C) (Oral)   Resp 16   Ht 5\' 5"  (1.651 m)   Wt 127 kg   SpO2 97%   BMI 46.59 kg/m   Visual Acuity Right Eye Distance:   Left Eye Distance:   Bilateral Distance:    Right Eye Near:   Left Eye Near:    Bilateral Near:     Physical Exam Vitals and nursing note reviewed.  Constitutional:      General: She is not in acute distress.    Appearance: Normal appearance. She is obese. She is not ill-appearing.  HENT:     Head: Normocephalic and atraumatic.  Eyes:     General:        Right eye: No discharge.        Left eye: No discharge.     Conjunctiva/sclera: Conjunctivae normal.  Cardiovascular:     Rate and Rhythm: Normal rate and regular rhythm.  Pulmonary:     Effort: Pulmonary effort is normal.     Breath sounds: Normal breath sounds. No wheezing or rales.  Neurological:     Mental Status: She is alert.  Psychiatric:        Mood and Affect: Mood normal.        Behavior: Behavior normal.    UC Treatments / Results  Labs (all labs ordered are listed, but only abnormal results are displayed) Labs Reviewed  RESP PANEL BY RT-PCR (FLU A&B,  COVID) [id number]    EKG   Radiology DG Chest 2 View  Result Date: 03/07/2020 CLINICAL DATA:  Cough and shortness of breath.  No fever. EXAM: CHEST - 2 VIEW COMPARISON:  July 11, 2017 FINDINGS: The heart, hila, and mediastinum are normal. No pneumothorax. A calcified granuloma is seen in the right lung. The right lung is otherwise clear. Minimal opacity in the lateral left mid lung, not seen previously. No other evidence of infiltrate or abnormality. IMPRESSION: 1. Minimal opacity in the lateral left mid lung may represent atelectasis. A very early infiltrate is considered less likely. Recommend attention on short-term follow-up imaging to ensure resolution. No other abnormalities. Electronically Signed   By: Dorise Bullion III M.D   On: 03/07/2020 13:26    Procedures Procedures (including critical care time)  Medications Ordered in UC Medications - No data to display  Initial Impression / Assessment and Plan / UC Course  I have reviewed the triage vital signs and the nursing notes.  Pertinent labs & imaging results that were available during my care of the patient were reviewed by me and considered in my medical decision making (see chart for details).    49 year old female presents with persistent cough and congestion.  Chest x-ray was obtained.  I did not appreciate an infiltrate.  Radiology noted a small opacity which was thought to be related to atelectasis.  Possible infiltrate.  Given persistent symptoms and radiological findings (read after discharge of patient), I am placing the patient  on doxycycline as well as Augmentin to cover for community-acquired pneumonia.  I believe that this is flaring her bronchitis as well.  Also sent in prednisone and Tussionex.  Final Clinical Impressions(s) / UC Diagnoses   Final diagnoses:  Asthmatic bronchitis with acute exacerbation, unspecified asthma severity, unspecified whether persistent     Discharge Instructions     Medication as  prescribed.  Take care  Dr. Lacinda Axon    ED Prescriptions    Medication Sig Dispense Auth. Provider   predniSONE (DELTASONE) 50 MG tablet 1 tablet daily x 5 days 5 tablet Adylin Hankey G, DO   doxycycline (VIBRAMYCIN) 100 MG capsule Take 1 capsule (100 mg total) by mouth 2 (two) times daily. 14 capsule [place], [place] G, DO   chlorpheniramine-HYDROcodone (TUSSIONEX PENNKINETIC ER) 10-8 MG/5ML SUER Take 5 mLs by mouth every 12 (twelve) hours as needed. 115 mL Hallie Ishida G, DO   albuterol (VENTOLIN HFA) 108 (90 Base) MCG/ACT inhaler Inhale 2 puffs into the lungs every 6 (six) hours as needed for wheezing or shortness of breath. 18 g Rawan Riendeau G, DO   amoxicillin-clavulanate (AUGMENTIN) 875-125 MG tablet Take 1 tablet by mouth 2 (two) times daily. 14 tablet Coral Spikes, DO     PDMP not reviewed this encounter.   Coral Spikes, [place] 03/07/20 1335

## 2020-03-12 DIAGNOSIS — Z20822 Contact with and (suspected) exposure to covid-19: Secondary | ICD-10-CM | POA: Diagnosis not present

## 2020-03-15 ENCOUNTER — Ambulatory Visit (INDEPENDENT_AMBULATORY_CARE_PROVIDER_SITE_OTHER): Payer: BC Managed Care – PPO | Admitting: Family Medicine

## 2020-03-15 ENCOUNTER — Other Ambulatory Visit: Payer: Self-pay

## 2020-03-15 ENCOUNTER — Encounter: Payer: Self-pay | Admitting: Family Medicine

## 2020-03-15 VITALS — BP 120/76 | HR 98 | Temp 98.2°F | Resp 16 | Ht 65.0 in | Wt 294.1 lb

## 2020-03-15 DIAGNOSIS — M5481 Occipital neuralgia: Secondary | ICD-10-CM

## 2020-03-15 DIAGNOSIS — Z23 Encounter for immunization: Secondary | ICD-10-CM | POA: Diagnosis not present

## 2020-03-15 DIAGNOSIS — J329 Chronic sinusitis, unspecified: Secondary | ICD-10-CM

## 2020-03-15 DIAGNOSIS — Z5181 Encounter for therapeutic drug level monitoring: Secondary | ICD-10-CM | POA: Diagnosis not present

## 2020-03-15 DIAGNOSIS — R918 Other nonspecific abnormal finding of lung field: Secondary | ICD-10-CM

## 2020-03-15 DIAGNOSIS — J310 Chronic rhinitis: Secondary | ICD-10-CM

## 2020-03-15 DIAGNOSIS — Z1211 Encounter for screening for malignant neoplasm of colon: Secondary | ICD-10-CM

## 2020-03-15 DIAGNOSIS — J4521 Mild intermittent asthma with (acute) exacerbation: Secondary | ICD-10-CM

## 2020-03-15 DIAGNOSIS — Z0000 Encounter for general adult medical examination without abnormal findings: Secondary | ICD-10-CM

## 2020-03-15 DIAGNOSIS — R946 Abnormal results of thyroid function studies: Secondary | ICD-10-CM

## 2020-03-15 MED ORDER — BECLOMETHASONE DIPROP HFA 40 MCG/ACT IN AERB: 2.0000 | INHALATION_SPRAY | Freq: Two times a day (BID) | RESPIRATORY_TRACT | 1 refills | Status: AC

## 2020-03-15 MED ORDER — ALBUTEROL SULFATE HFA 108 (90 BASE) MCG/ACT IN AERS: 2.0000 | INHALATION_SPRAY | Freq: Four times a day (QID) | RESPIRATORY_TRACT | 2 refills | Status: AC | PRN

## 2020-03-15 MED ORDER — IPRATROPIUM-ALBUTEROL 0.5-2.5 (3) MG/3ML IN SOLN: 3.0000 mL | Freq: Three times a day (TID) | RESPIRATORY_TRACT | 1 refills | Status: AC | PRN

## 2020-03-15 NOTE — Patient Instructions (Signed)

## 2020-03-15 NOTE — Progress Notes (Signed)
Patient: Jamie Hardin, Female    DOB: 1971-04-02, 49 y.o.   MRN: [id number] Delsa Grana, PA-C Visit Date: 03/15/2020  Today's Provider: Delsa Grana, PA-C   Chief Complaint  Patient presents with  . Annual Exam   Subjective:   Annual physical exam:  Jamie Hardin is a 49 y.o. female who presents today for complete physical exam:  Exercise/Activity:  Exercising less than her normal 1 d a week  Diet/nutrition:    Appetite different now with medication Sleep: sleeping ok  Recently ill at home with URI sx and asthma exacerbation, her husband had COVID and she spent most of the last 3 weeks quarantined at home.  Asthma - mild intermittent, used to have a daily inhaler for when she was sick - now she only has albuterol She has done 2 rounds of abx and 2 rounds of steroids, she still cannot get rid of the cough and has some SOB for example if she is going up stairs.  She was COVID neg Reviewed chart she had 2 rounds of prednisone she had a Z-Pak, doxycycline and Augmentin  She has occipital neuralgia managed by neurology, Dr. Tasia Catchings, she has been using muscle relaxers, NSAIDs, Celebrex, Voltaren gel to manage neck pain but now that occipital neuralgia has been diagnosed and is being managed she is no longer on these medicines for cervical radiculopathy She was put on amitriptyline, Aimovig, uses Zofran as needed, Imitrex abortive After starting amitriptyline she gained a lot of weight and has had an uncontrollable appetite is craved a lot of junk food and gained a lot of weight, she has discussed this with the neurologist and they have decreased this medicine from 25 mg down to 10, and she does have an appointment to follow-up with her weight management bariatric specialist at [facility] She does have labs ordered including iron panel, thyroid labs, basic cell counts, chemistry and lipid panel -will have her complete her labs with a specialist and we will review their results, patient agrees to  send [place] a message when they are done  Colonoscopy in 2016 09/07/2014 - per chart review and care everywhere only done to splenic flexure - pt ok to go to local GI for screening colonoscopy  USPSTF grade A and B recommendations - reviewed and addressed today  Depression:  Phq 9 completed today by patient, was reviewed by me with patient in the room PHQ score is neg, pt feels good PHQ 2/9 Scores 03/15/2020 07/17/2019 04/18/2019 01/09/2019  PHQ - 2 Score 0 0 0 0  PHQ- 9 Score - 0 0 0   Depression screen [facility] 2/9 03/15/2020 07/17/2019 04/18/2019 01/09/2019 01/16/2018  Decreased Interest 0 0 0 0 0  Down, Depressed, Hopeless 0 0 0 0 0  PHQ - 2 Score 0 0 0 0 0  Altered sleeping - 0 0 0 3  Tired, decreased energy - 0 0 0 0  Change in appetite - 0 0 0 0  Feeling bad or failure about yourself  - 0 0 0 0  Trouble concentrating - 0 0 0 0  Moving slowly or fidgety/restless - 0 0 0 0  Suicidal thoughts - 0 0 0 0  PHQ-9 Score - 0 0 0 3  Difficult doing work/chores - Not difficult at all Not difficult at all Not difficult at all -   Alcohol screening: [place] Office Visit from 03/15/2020 in [facility]  AUDIT-C Score 0     Immunizations and  Health Maintenance: Health Maintenance  Topic Date Due  . COVID-19 Vaccine (3 - Moderna risk 4-dose series) 07/25/2019  . MAMMOGRAM  12/15/2020  . TETANUS/TDAP  09/16/2026  . INFLUENZA VACCINE  Completed  . Hepatitis C Screening  Completed  . HIV Screening  Completed  . PAP SMEAR-Modifier  Discontinued     Hep C Screening: done  STD testing and prevention (HIV/chl/gon/syphilis):  see above, no additional testing desired by pt today  - HIV  Intimate partner violence:  Safe   Sexual History/Pain during Intercourse: Married, no pelvic complaints  Menstrual History/LMP/Abnormal Bleeding: s/p total hysterectomy No LMP recorded. Patient has had a hysterectomy.  Incontinence Symptoms:   Mild and occasional stress  incontinence  Breast cancer: mammogram recently done Last Mammogram: *see HM list above BRCA gene screening: unknown   Cervical cancer screening: hysterectomy  Pt denies family hx of cancers - breast, ovarian, uterine, colon:     Osteoporosis:   Discussion on osteoporosis per age, including high calcium and vitamin D supplementation, weight bearing exercises Not on her supplements   Skin cancer:  Hx of skin CA -  NO Discussed atypical lesions   Colorectal cancer:   Colonoscopy is due Discussed concerning signs and sx of CRC, pt denies melena, hematochezia  Lung cancer:   Low Dose CT Chest recommended if Age 18-80 years, 30 pack-year currently smoking OR have quit w/in 15years. Patient does not qualify.    Social History   Tobacco Use  . Smoking status: Former Smoker    Packs/day: 1.00    Years: 20.00    Pack years: 20.00    Types: Cigarettes    Quit date: 04/04/2003    Years since quitting: 16.9  . Smokeless tobacco: Never Used  Vaping Use  . Vaping Use: Never used  Substance Use Topics  . Alcohol use: Yes    Comment: Very Rare  . Drug use: No     Flowsheet Row Office Visit from 03/15/2020 in [facility]  AUDIT-C Score 0      Family History  Problem Relation Age of Onset  . Fibromyalgia Mother   . Diabetes Mother   . Cancer Father        prostate  . Diabetes Maternal Aunt   . Diabetes Paternal Aunt   . Scleroderma Maternal Grandmother   . Heart disease Maternal Grandfather   . Hypertension Neg Hx   . Hyperlipidemia Neg Hx   . Stroke Neg Hx   . COPD Neg Hx      Blood pressure/Hypertension: BP Readings from Last 3 Encounters:  03/15/20 120/76  03/07/20 112/83  03/01/20 129/81    Weight/Obesity: Wt Readings from Last 3 Encounters:  03/15/20 294 lb 1.6 oz (133.4 kg)  03/07/20 280 lb (127 kg)  03/01/20 285 lb (129.3 kg)   BMI Readings from Last 3 Encounters:  03/15/20 48.94 kg/m  03/07/20 46.59 kg/m  03/01/20 47.43 kg/m      Lipids:  Lab Results  Component Value Date   CHOL 142 01/16/2018   CHOL 175 01/15/2017   CHOL 152 01/14/2016   Lab Results  Component Value Date   HDL 38 (L) 01/16/2018   HDL 64 01/15/2017   HDL 51 01/14/2016   Lab Results  Component Value Date   LDLCALC 84 01/16/2018   LDLCALC 92 01/15/2017   LDLCALC 81 01/14/2016   Lab Results  Component Value Date   TRIG 104 01/16/2018   TRIG 91 01/15/2017   TRIG 101  01/14/2016   Lab Results  Component Value Date   CHOLHDL 3.7 01/16/2018   CHOLHDL 2.7 01/15/2017   CHOLHDL 3.0 01/14/2016   No results found for: LDLDIRECT Based on the results of lipid panel his/her cardiovascular risk factor ( using [facility] )  in the next 10 years is: The 10-year ASCVD risk score Mikey Bussing DC Brooke Bonito., et al., 2013) is: 0.6%   Values used to calculate the score:     Age: 58 years     Sex: Female     Is Non-Hispanic African American: No     Diabetic: No     Tobacco smoker: No     Systolic Blood Pressure: 212 mmHg     Is BP treated: No     HDL Cholesterol: 63 mg/dL     Total Cholesterol: 184 mg/dL Glucose:  Glucose  Date Value Ref Range Status  04/13/2012 95 65 - 99 mg/dL Final   Glucose, Bld  Date Value Ref Range Status  06/16/2019 86 70 - 99 mg/dL Final    Comment:    Glucose reference range applies only to samples taken after fasting for at least 8 hours. Performed at [facility], [address] [place], [place] [address]   01/16/2018 89 65 - 99 mg/dL Final    Comment:    .            Fasting reference interval .   01/15/2017 99 65 - 99 mg/dL Final    Comment:    .            Fasting reference interval .     Advanced Care Planning:  A voluntary discussion about advance care planning including the explanation and discussion of advance directives.   Discussed health care proxy and Living will, and the patient was able to identify a health care proxy as husband.   Patient does not have a living will at present time.    Social History      She        Social History   Socioeconomic History  . Marital status: Married    Spouse name: Dominica Severin  . Number of children: 3  . Years of education: 62  . Highest education level: Associate degree: occupational, Hotel manager, or vocational program  Occupational History  . Not on file  Tobacco Use  . Smoking status: Former Smoker    Packs/day: 1.00    Years: 20.00    Pack years: 20.00    Types: Cigarettes    Quit date: 04/04/2003    Years since quitting: 16.9  . Smokeless tobacco: Never Used  Vaping Use  . Vaping Use: Never used  Substance and Sexual Activity  . Alcohol use: Yes    Comment: Very Rare  . Drug use: No  . Sexual activity: Yes    Birth control/protection: Surgical    Comment: Total hystertomy   Other Topics Concern  . Not on file  Social History Narrative  . Not on file   Social Determinants of Health   Financial Resource Strain: Low Risk   . Difficulty of Paying Living Expenses: Not hard at all  Food Insecurity: No Food Insecurity  . Worried About Charity fundraiser in the Last Year: Never true  . Ran Out of Food in the Last Year: Never true  Transportation Needs: No Transportation Needs  . Lack of Transportation (Medical): No  . Lack of Transportation (Non-Medical): No  Physical Activity: Insufficiently Active  . Days  of Exercise per Week: 1 day  . Minutes of Exercise per Session: 20 min  Stress: Stress Concern Present  . Feeling of Stress : Rather much  Social Connections: Moderately Isolated  . Frequency of Communication with Friends and Family: Once a week  . Frequency of Social Gatherings with Friends and Family: Once a week  . Attends Religious Services: 1 to 4 times per year  . Active Member of Clubs or Organizations: No  . Attends Archivist Meetings: Never  . Marital Status: Married    Family History        Family History  Problem Relation Age of Onset  . Fibromyalgia Mother   . Diabetes Mother   .  Cancer Father        prostate  . Diabetes Maternal Aunt   . Diabetes Paternal Aunt   . Scleroderma Maternal Grandmother   . Heart disease Maternal Grandfather   . Hypertension Neg Hx   . Hyperlipidemia Neg Hx   . Stroke Neg Hx   . COPD Neg Hx     Patient Active Problem List   Diagnosis Date Noted  . Hot flashes 07/25/2018  . Onychomycosis 07/25/2018  . DDD (degenerative disc disease), cervical 07/11/2017  . Pain in joint involving ankle and foot 07/11/2017  . Low back pain 07/11/2017  . 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 Influenza vaccination declined by patient 01/15/2017  . Contusion of right knee 11/15/2016  . Subluxation of shoulder joint 11/15/2016  . Abnormal mammogram of both breasts 02/04/2016  . History of pneumonia as indication for 23-polyvalent pneumococcal polysaccharide vaccine 01/14/2016  . Preventative health care 01/14/2016  . Facial pain, atypical 04/27/2015  . Eustachian tube dysfunction 04/27/2015  . 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 Abnormal thyroid function test 02/15/2015  . Rash 02/15/2015  . Breast cancer screening 01/12/2015  . Elevated blood-pressure reading without diagnosis of hypertension 01/12/2015  . GAD (generalized anxiety disorder) 01/12/2015  . Obstructive sleep apnea on CPAP 01/12/2015  . Allergy   . Asthma   . Morbid obesity ([place])   . Dysthymic disorder   . Peripheral neuropathy   . GERD (gastroesophageal reflux disease)   . Fibromyalgia 07/22/2014  . 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 Morbid obesity with BMI of 50.0-59.9, adult ([place]) 07/22/2014    Past Surgical History:  Procedure Laterality Date  . ABDOMINAL HYSTERECTOMY  2007   complete due to endometriosis  . CHOLECYSTECTOMY  01/2018  . 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 NASAL SINUS SURGERY  1996 and 1999   x 2     Current Outpatient Medications:  .   albuterol (ACCUNEB) 0.63 MG/3ML nebulizer solution, USE 1 VIAL VIA NEBULIZER EVERY 4 HOURS AS NEEDED FOR WHEEZING, Disp: 75 mL, Rfl: 3 .  albuterol (VENTOLIN HFA) 108 (90 Base) MCG/ACT inhaler, Inhale 2 puffs into the lungs every 6 (six) hours as needed for wheezing or shortness of breath., Disp: 18 g, Rfl: 0 .  amitriptyline (ELAVIL) 10 MG tablet, Take 10 mg by mouth at bedtime., Disp: , Rfl:  .  calcium-vitamin D 250-100 MG-UNIT tablet, Take 1 tablet by mouth 2 (two) times daily., Disp: , Rfl:  .  ibuprofen (ADVIL) 200 MG tablet, Take 1-2 tablets (200-400 mg total) by mouth every 6 (six) hours as needed., Disp: , Rfl:  .  Multiple Vitamins-Minerals (MULTIVITAMIN WITH MINERALS) tablet, Take 1 tablet by mouth daily.,  Disp: , Rfl:  .  SUMAtriptan (IMITREX) 50 MG tablet, Take 1-2 tablets (50-100 mg total) by mouth every 2 (two) hours as needed for migraine. May repeat in 2 hours if headache persists or recurs., Disp: 10 tablet, Rfl: 2 .  valACYclovir (VALTREX) 1000 MG tablet, TAKE 2 TABLETS BY MOUTH AT FIRST SIGN OF OUTBREAK FOLLOWED BY 2 TABLETS MORE 12 HOURS LATER, Disp: 4 tablet, Rfl: 5 .  amoxicillin-clavulanate (AUGMENTIN) 875-125 MG tablet, Take 1 tablet by mouth 2 (two) times daily., Disp: 14 tablet, Rfl: 0 .  benzonatate (TESSALON) 100 MG capsule, Take 2 capsules (200 mg total) by mouth every 8 (eight) hours., Disp: 21 capsule, Rfl: 0 .  chlorpheniramine-HYDROcodone (TUSSIONEX PENNKINETIC ER) 10-8 MG/5ML SUER, Take 5 mLs by mouth every 12 (twelve) hours as needed., Disp: 115 mL, Rfl: 0 .  ipratropium (ATROVENT) 0.06 % nasal spray, Place 2 sprays into both nostrils 4 (four) times daily., Disp: 15 mL, Rfl: 12 .  ondansetron (ZOFRAN ODT) 8 MG disintegrating tablet, Take 1 tablet (8 mg total) by mouth every 8 (eight) hours as needed for nausea or vomiting., Disp: 20 tablet, Rfl: 0 .  predniSONE (DELTASONE) 50 MG tablet, 1 tablet daily x 5 days, Disp: 5 tablet, Rfl: 0  Allergies  Allergen Reactions   . Codeine   . Naproxen     Patient Care Team: Delsa Grana, PA-C as PCP - General (Family Medicine)  Review of Systems  Constitutional: Negative.   HENT: Negative.   Eyes: Negative.   Respiratory: Positive for cough and shortness of breath. Negative for choking, chest tightness and wheezing.   Cardiovascular: Negative.  Negative for chest pain, palpitations and leg swelling.  Gastrointestinal: Negative.   Endocrine: Negative.   Genitourinary: Negative.   Musculoskeletal: Negative.   Skin: Negative.   Allergic/Immunologic: Negative.   Neurological: Negative.   Hematological: Negative.   Psychiatric/Behavioral: Negative.   All other systems reviewed and are negative.    I personally reviewed active problem list, medication list, allergies, family history, social history, health maintenance, notes from last encounter, lab results, imaging with the patient/caregiver today.        Objective:   Vitals:  Vitals:   03/15/20 1344  BP: 120/76  Pulse: 98  Resp: 16  Temp: 98.2 F (36.8 C)  TempSrc: Oral  SpO2: 97%  Weight: 294 lb 1.6 oz (133.4 kg)  Height: '5\' 5"'  (1.651 m)    Body mass index is 48.94 kg/m.  Physical Exam Vitals and nursing note reviewed.  Constitutional:      General: She is not in acute distress.    Appearance: Normal appearance. She is well-developed. She is obese. She is not ill-appearing, toxic-appearing or diaphoretic.     Interventions: Face mask in place.  HENT:     Head: Normocephalic and atraumatic.     Right Ear: Hearing, tympanic membrane, ear canal and external ear normal.     Left Ear: Hearing, tympanic membrane, ear canal and external ear normal.     Nose: Mucosal edema, congestion and rhinorrhea present. Rhinorrhea is clear.     Mouth/Throat:     Mouth: Mucous membranes are moist.     Pharynx: Oropharynx is clear. Uvula midline. No uvula swelling.  Eyes:     General: Lids are normal. No scleral icterus.       Right eye: No  discharge.        Left eye: No discharge.     Conjunctiva/sclera: Conjunctivae normal.  Neck:  Trachea: Phonation normal. No tracheal deviation.  Cardiovascular:     Rate and Rhythm: Normal rate and regular rhythm.     Pulses: Normal pulses.          Radial pulses are 2+ on the right side and 2+ on the left side.       Posterior tibial pulses are 2+ on the right side and 2+ on the left side.     Heart sounds: Normal heart sounds. No murmur heard. No friction rub. No gallop.   Pulmonary:     Effort: Pulmonary effort is normal. No tachypnea, accessory muscle usage, respiratory distress or retractions.     Breath sounds: Normal breath sounds. No stridor, decreased air movement or transmitted upper airway sounds. No decreased breath sounds, wheezing, rhonchi or rales.     Comments: Lung CTA A&P, intermittent coughing Chest:     Chest wall: No tenderness.  Abdominal:     General: Bowel sounds are normal. There is no distension.     Palpations: Abdomen is soft.  Musculoskeletal:     Right lower leg: No edema.     Left lower leg: No edema.  Skin:    General: Skin is warm and dry.     Capillary Refill: Capillary refill takes less than 2 seconds.     Coloration: Skin is not jaundiced or pale.     Findings: No rash.  Neurological:     Mental Status: She is alert.     Motor: No abnormal muscle tone.     Gait: Gait normal.  Psychiatric:        Mood and Affect: Mood normal.        Speech: Speech normal.        Behavior: Behavior normal.       Fall Risk: Fall Risk  03/15/2020 07/17/2019 04/18/2019 01/09/2019 07/25/2018  Falls in the past year? 1 0 0 0 0  Number falls in past yr: 1 0 0 0 0  Injury with Fall? 1 0 0 0 -  Risk for fall due to : History of fall(s) - - - -  Follow up Falls evaluation completed - - - -    Functional Status Survey: Is the patient deaf or have difficulty hearing?: No Does the patient have difficulty seeing, even when wearing glasses/contacts?: No Does  the patient have difficulty concentrating, remembering, or making decisions?: No Does the patient have difficulty walking or climbing stairs?: No Does the patient have difficulty dressing or bathing?: No Does the patient have difficulty doing errands alone such as visiting a doctor's office or shopping?: No   Assessment & Plan:    CPE completed today  . USPSTF grade A and B recommendations reviewed with patient; age-appropriate recommendations, preventive care, screening tests, etc discussed and encouraged; healthy living encouraged; see AVS for patient education given to patient  . Discussed importance of 150 minutes of physical activity weekly, AHA exercise recommendations given to pt in AVS/handout  . Discussed importance of healthy diet:  eating lean meats and proteins, avoiding trans fats and saturated fats, avoid simple sugars and excessive carbs in diet, eat 6 servings of fruit/vegetables daily and drink plenty of water and avoid sweet beverages.    . Recommended pt to do annual eye exam and routine dental exams/cleanings  . Depression, alcohol, fall screening completed as documented above and per flowsheets  . Reviewed Health Maintenance: Health Maintenance  Topic Date Due  . COVID-19 Vaccine (3 - Moderna risk 4-dose series) 07/25/2019  .  MAMMOGRAM  12/15/2020  . TETANUS/TDAP  09/16/2026  . INFLUENZA VACCINE  Completed  . Hepatitis C Screening  Completed  . HIV Screening  Completed  . PAP SMEAR-Modifier  Discontinued    . Immunizations: Immunization History  Administered Date(s) Administered  . Influenza,inj,Quad PF,6+ Mos 01/12/2015, 01/14/2016, 01/16/2018, 03/15/2020  . Moderna Sars-Covid-2 Vaccination 06/02/2019, 06/27/2019  . Pneumococcal Polysaccharide-23 02/13/2014, 01/14/2016  . Td 09/15/2016     Orders Placed This Encounter  Procedures  . Flu Vaccine QUAD 6+ mos PF IM (Fluarix Quad PF)      ICD-10-CM   1. Adult general medical exam  Z00.00   2. Need for  influenza vaccination  Z23 Flu Vaccine QUAD 6+ mos PF IM (Fluarix Quad PF)  3. Encounter for medication monitoring  Z51.81   4. Mild intermittent asthma with acute exacerbation  J45.21    No wheeze, continue rescue inhaler, add Mucinex, cough suppressants and work on management of upper URI symptoms  5. Rhinosinusitis  J31.0    J32.9    Instructed patient to resume her allergy and sinus medication she has edema erythema and profuse nasal discharge  6. Morbid obesity ([place])  E66.01    She is following up with bariatric specialist  7. Bilateral occipital neuralgia  M54.81    Managed by neurology  8. Abnormal thyroid function test  R94.6    Bariatric specialist are rechecking thyroid labs  9. Screening for malignant neoplasm of colon  Z12.11 Ambulatory referral to Gastroenterology   Refer to [facility] for colonoscopy for colorectal cancer screening  10. Infiltrate of left lung present on chest x-ray  R91.8 DG Chest 2 View   reviewed UC visits and imaging results - recommend short term interval f/up imaging - will notify pt to get done in the next 1-2 weeks       Delsa Grana, PA-C 03/15/20 2:02 PM  [place]

## 2020-03-16 ENCOUNTER — Telehealth: Payer: Self-pay | Admitting: Emergency Medicine

## 2020-03-16 NOTE — Telephone Encounter (Signed)
Copied from [place] [id number]. Topic: Quick Communication - See Telephone Encounter >> Mar 16, 2020  9:59 AM Loma Boston wrote: CRM for notification. See Telephone encounter for: 03/16/20. Pt had appt yesterday and did get test results today, test was positive for covid . Pls fu as wants an infusion appt sch. Call pt [phone] Request ASAP >> Mar 16, 2020 10:30 AM [place], Lenna Sciara wrote: Hulen Skains and spoke to pt to get further information, pt states she tested for Covid on Friday Dec 10,2021at [facility] and got her results back this morning around 9 and they were positive for Covid. Pt states she has been sick for 3 and a half weeks     Spoke with patient and informed her that we have put in a referral to the [facility] group via secure chat.  Also informed patient that they will reach out to the patient to screen for the service.  Patient verbalized understanding.

## 2020-03-17 ENCOUNTER — Telehealth: Payer: Self-pay | Admitting: Unknown Physician Specialty

## 2020-03-17 DIAGNOSIS — G4733 Obstructive sleep apnea (adult) (pediatric): Secondary | ICD-10-CM | POA: Diagnosis not present

## 2020-03-17 NOTE — Telephone Encounter (Signed)
Called to discuss with [name] about Covid symptoms and the use of  monoclonal antibody infusion for those with mild to moderate Covid symptoms and at a high risk of hospitalization.     Pt is not qualified for this infusion due to  Pt does not qualify for infusion therapy as her symptoms first presented > 10 days prior to timing of infusion. Symptoms tier reviewed as well as criteria for ending isolation. Preventative practices reviewed. Patient verbalized understanding    Patient Active Problem List   Diagnosis Date Noted  . Peripheral neuropathy, idiopathic 11/26/2019  . Bilateral occipital neuralgia 11/26/2019  . Hot flashes 07/25/2018  . Onychomycosis 07/25/2018  . DDD (degenerative disc disease), cervical 07/11/2017  . Pain in joint involving ankle and foot 07/11/2017  . Low back pain 07/11/2017  . 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 Influenza vaccination declined by patient 01/15/2017  . Contusion of right knee 11/15/2016  . Subluxation of shoulder joint 11/15/2016  . Abnormal mammogram of both breasts 02/04/2016  . History of pneumonia as indication for 23-polyvalent pneumococcal polysaccharide vaccine 01/14/2016  . Preventative health care 01/14/2016  . Facial pain, atypical 04/27/2015  . Eustachian tube dysfunction 04/27/2015  . 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 Abnormal thyroid function test 02/15/2015  . Rash 02/15/2015  . Breast cancer screening 01/12/2015  . Elevated blood-pressure reading without diagnosis of hypertension 01/12/2015  . GAD (generalized anxiety disorder) 01/12/2015  . OSA on CPAP 01/12/2015  . Allergy   . Asthma   . Morbid obesity ([place])   . Dysthymic disorder   . GERD (gastroesophageal reflux disease)   . Fibromyalgia 07/22/2014  . 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 Morbid obesity with BMI of 50.0-59.9, adult ([place]) 07/22/2014

## 2020-03-18 ENCOUNTER — Other Ambulatory Visit: Payer: Self-pay

## 2020-03-18 ENCOUNTER — Telehealth (INDEPENDENT_AMBULATORY_CARE_PROVIDER_SITE_OTHER): Payer: Self-pay | Admitting: Gastroenterology

## 2020-03-18 DIAGNOSIS — Z1211 Encounter for screening for malignant neoplasm of colon: Secondary | ICD-10-CM

## 2020-03-18 MED ORDER — NA SULFATE-K SULFATE-MG SULF 17.5-3.13-1.6 GM/177ML PO SOLN: 1.0000 | Freq: Once | ORAL | 0 refills | Status: AC

## 2020-03-18 NOTE — Progress Notes (Signed)
Gastroenterology Pre-Procedure Review  Request Date: 06/28/20 Requesting Physician: Dr. Vicente Males  PATIENT REVIEW QUESTIONS: The patient responded to the following health history questions as indicated:    1. Are you having any GI issues? Reflux, however contributes it to diet and sleeve surgery 2 years ago. Intestinal malrotation 2. Do you have a personal history of Polyps? no 3. Do you have a family history of Colon Cancer or Polyps? no 4. Diabetes Mellitus? no 5. Joint replacements in the past 12 months?no 6. Major health problems in the past 3 months?COVID Test Positive 03/12/20 via [facility]  7. Any artificial heart valves, MVP, or defibrillator?no    MEDICATIONS & ALLERGIES:    Patient reports the following regarding taking any anticoagulation/antiplatelet therapy:   Plavix, Coumadin, Eliquis, Xarelto, Lovenox, Pradaxa, Brilinta, or Effient? no Aspirin? no  Patient confirms/reports the following medications:  Current Outpatient Medications  Medication Sig Dispense Refill  . albuterol (ACCUNEB) 0.63 MG/3ML nebulizer solution USE 1 VIAL VIA NEBULIZER EVERY 4 HOURS AS NEEDED FOR WHEEZING 75 mL 3  . albuterol (VENTOLIN HFA) 108 (90 Base) MCG/ACT inhaler Inhale 2 puffs into the lungs every 6 (six) hours as needed for wheezing or shortness of breath. 18 g 2  . amitriptyline (ELAVIL) 10 MG tablet Take 10 mg by mouth at bedtime.    . beclomethasone (QVAR) 40 MCG/ACT inhaler Inhale 2 puffs into the lungs 2 (two) times daily. 1 each 1  . calcium-vitamin D 250-100 MG-UNIT tablet Take 1 tablet by mouth 2 (two) times daily.    Eduard Roux (AIMOVIG) 70 MG/ML SOAJ Inject 70 mg into the skin every 28 (twenty-eight) days.    Marland Kitchen ibuprofen (ADVIL) 200 MG tablet Take 1-2 tablets (200-400 mg total) by mouth every 6 (six) hours as needed.    Marland Kitchen ipratropium-albuterol (DUONEB) 0.5-2.5 (3) MG/3ML SOLN Take 3 mLs by nebulization 3 (three) times daily as needed. 180 mL 1  . Multiple Vitamins-Minerals  (MULTIVITAMIN WITH MINERALS) tablet Take 1 tablet by mouth daily.    . ondansetron (ZOFRAN ODT) 8 MG disintegrating tablet Take 1 tablet (8 mg total) by mouth every 8 (eight) hours as needed for nausea or vomiting. 20 tablet 0  . SUMAtriptan (IMITREX) 50 MG tablet Take 1-2 tablets (50-100 mg total) by mouth every 2 (two) hours as needed for migraine. May repeat in 2 hours if headache persists or recurs. 10 tablet 2  . valACYclovir (VALTREX) 1000 MG tablet TAKE 2 TABLETS BY MOUTH AT FIRST SIGN OF OUTBREAK FOLLOWED BY 2 TABLETS MORE 12 HOURS LATER 4 tablet 5   No current facility-administered medications for this visit.    Patient confirms/reports the following allergies:  Allergies  Allergen Reactions  . Codeine   . Naproxen     No orders of the defined types were placed in this encounter.   AUTHORIZATION INFORMATION Primary Insurance: 1D#: Group #:  Secondary Insurance: 1D#: Group #:  SCHEDULE INFORMATION: Date: 06/28/20 Time: Location:[facility]

## 2020-03-20 DIAGNOSIS — Z20822 Contact with and (suspected) exposure to covid-19: Secondary | ICD-10-CM | POA: Diagnosis not present

## 2020-03-22 ENCOUNTER — Ambulatory Visit
Admission: RE | Admit: 2020-03-22 | Discharge: 2020-03-22 | Disposition: A | Discharge status: Home / Self Care | Payer: BC Managed Care – PPO | Source: Ambulatory Visit | Attending: Family Medicine | Admitting: Family Medicine

## 2020-03-22 ENCOUNTER — Other Ambulatory Visit: Payer: Self-pay

## 2020-03-22 ENCOUNTER — Ambulatory Visit
Admission: RE | Admit: 2020-03-22 | Discharge: 2020-03-22 | Disposition: A | Discharge status: Home / Self Care | Payer: BC Managed Care – PPO | Attending: Family Medicine | Admitting: Family Medicine

## 2020-03-22 ENCOUNTER — Telehealth: Payer: Self-pay | Admitting: Internal Medicine

## 2020-03-22 ENCOUNTER — Encounter: Payer: Self-pay | Admitting: Family Medicine

## 2020-03-22 DIAGNOSIS — R918 Other nonspecific abnormal finding of lung field: Secondary | ICD-10-CM | POA: Diagnosis not present

## 2020-03-22 DIAGNOSIS — J90 Pleural effusion, not elsewhere classified: Secondary | ICD-10-CM | POA: Diagnosis not present

## 2020-03-22 DIAGNOSIS — R059 Cough, unspecified: Secondary | ICD-10-CM | POA: Diagnosis not present

## 2020-03-22 NOTE — Telephone Encounter (Signed)
Patient is aware of recommendations. She voiced her understanding and had no further questions.  Nothing further needed.  

## 2020-03-22 NOTE — Telephone Encounter (Signed)
Dr. Mortimer Fries patient last seen 05/2019 for asthma.   She has been seen at [facility] twice. First time was 4wk ago,  she was dx with adenovirus and given zpak and prednisone taper. Second time was 9 days ago, she was tested for covid and it was negative. She was given another round of prednisone and Augmentin.  She then tested at [facility] a few days later and was positive for covid. She had a repeat test on 03/20/2020, which was negative. She has had no relief in sx with anx and prednsione.  She did not have antibody infusion or admission.  She reports of productive cough with greenish to yellow mucus, nasal/chest congestion and increased sob. Last fever was 3 weeks ago.    JD, please advise as Dr. Mortimer Fries is unavailable. Thanks

## 2020-03-22 NOTE — Telephone Encounter (Signed)
Covid positive several days ago with worsening symptoms. Would advise she go to ED for CXR and check oxygen saturations and consider CTA chest given risk of VTE/PE with COVID. She has not improved with asthma therapies (steroids x 2) or abx courses (x2). As such, limited in what can be done in office.

## 2020-03-22 NOTE — Telephone Encounter (Signed)
Routing to DOD.

## 2020-03-23 ENCOUNTER — Telehealth: Payer: Self-pay | Admitting: Internal Medicine

## 2020-03-23 ENCOUNTER — Encounter: Payer: BC Managed Care – PPO | Admitting: Family Medicine

## 2020-03-23 ENCOUNTER — Other Ambulatory Visit: Payer: Self-pay

## 2020-03-23 ENCOUNTER — Emergency Department
Admission: EM | Admit: 2020-03-23 | Discharge: 2020-03-23 | Disposition: A | Discharge status: Home / Self Care | Payer: BC Managed Care – PPO | Attending: Emergency Medicine | Admitting: Emergency Medicine

## 2020-03-23 ENCOUNTER — Encounter: Payer: Self-pay | Admitting: Emergency Medicine

## 2020-03-23 DIAGNOSIS — J208 Acute bronchitis due to other specified organisms: Secondary | ICD-10-CM | POA: Diagnosis not present

## 2020-03-23 DIAGNOSIS — J45909 Unspecified asthma, uncomplicated: Secondary | ICD-10-CM | POA: Diagnosis not present

## 2020-03-23 DIAGNOSIS — Z87891 Personal history of nicotine dependence: Secondary | ICD-10-CM | POA: Diagnosis not present

## 2020-03-23 DIAGNOSIS — R0602 Shortness of breath: Secondary | ICD-10-CM | POA: Diagnosis not present

## 2020-03-23 LAB — CBC WITH DIFFERENTIAL/PLATELET
Abs Immature Granulocytes: 0.02 10*3/uL (ref 0.00–0.07)
Basophils Absolute: 0 10*3/uL (ref 0.0–0.1)
Basophils Relative: 0 %
Eosinophils Absolute: 0.1 10*3/uL (ref 0.0–0.5)
Eosinophils Relative: 2 %
HCT: 40.5 % (ref 36.0–46.0)
Hemoglobin: 13.4 g/dL (ref 12.0–15.0)
Immature Granulocytes: 0 %
Lymphocytes Relative: 28 %
Lymphs Abs: 2.1 10*3/uL (ref 0.7–4.0)
MCH: 28.7 pg (ref 26.0–34.0)
MCHC: 33.1 g/dL (ref 30.0–36.0)
MCV: 86.7 fL (ref 80.0–100.0)
Monocytes Absolute: 0.5 10*3/uL (ref 0.1–1.0)
Monocytes Relative: 6 %
Neutro Abs: 4.6 10*3/uL (ref 1.7–7.7)
Neutrophils Relative %: 64 %
Platelets: 218 10*3/uL (ref 150–400)
RBC: 4.67 MIL/uL (ref 3.87–5.11)
RDW: 12.7 % (ref 11.5–15.5)
WBC: 7.3 10*3/uL (ref 4.0–10.5)
nRBC: 0 % (ref 0.0–0.2)

## 2020-03-23 LAB — COMPREHENSIVE METABOLIC PANEL
ALT: 28 U/L (ref 0–44)
AST: 22 U/L (ref 15–41)
Albumin: 3.8 g/dL (ref 3.5–5.0)
Alkaline Phosphatase: 78 U/L (ref 38–126)
Anion gap: 9 (ref 5–15)
BUN: 17 mg/dL (ref 6–20)
CO2: 26 mmol/L (ref 22–32)
Calcium: 9 mg/dL (ref 8.9–10.3)
Chloride: 108 mmol/L (ref 98–111)
Creatinine, Ser: 0.65 mg/dL (ref 0.44–1.00)
GFR, Estimated: >60 mL/min (ref >60)
Glucose, Bld: 95 mg/dL (ref 70–99)
Potassium: 4 mmol/L (ref 3.5–5.1)
Sodium: 143 mmol/L (ref 135–145)
Total Bilirubin: 0.5 mg/dL (ref 0.3–1.2)
Total Protein: 7 g/dL (ref 6.5–8.1)

## 2020-03-23 MED ORDER — PREDNISONE 10 MG PO TABS: ORAL_TABLET | ORAL | 0 refills | Status: AC

## 2020-03-23 NOTE — ED Triage Notes (Signed)
Pt in via POV, complaints of ongoing and worsening shortness of breath.  Reports being Covid(+) on 12/13, going for chest xray, advised to be evaluated in ED due abnormal result.  Ambulatory to triage, NAD noted at this time.

## 2020-03-23 NOTE — Telephone Encounter (Signed)
Unfortunately not much more we can offer outpatient. I agree with Dr. Silas Flood, she need ED evaluation if symptoms are worse and not better better.  For cough she can take delsym and mucinex. Rest, stay well hydrated, tylenol for pain.

## 2020-03-23 NOTE — Telephone Encounter (Signed)
Called and spoke with patient about Beth's recs of going to the emergency room for further evaluation since her symptoms haven't improved. Advised that Dr. Silas Flood mentioned she may need a CT for rule out blood clot and it was also mentioned in her CXR to consider CT for further evaluation. Patient states that she can't afford to go to the ED and she already owes them a couple thousand dollars. Advised patient that there is nothing further to recommend than Beth's. She expressed understanding and I advised patient to call if she needed anything. Nothing further needed at this time.

## 2020-03-23 NOTE — Telephone Encounter (Signed)
Called and spoke with patient who states she went and got a chest xray as advised yesterday but cannot afford to go to ED. Would like to know what she can do now regarding her cough. Patient states that she is having more shortness of breath,pain on right side, dry/productive cough with yellow/green sputum. Patient tested positive for Covid 10th, before that she had Adenoviruses. No fevers for last 3 weeks.  IMPRESSION: Persistent small left lateral midlung opacity. Consider CT chest for further evaluation.   Beth please advise

## 2020-03-23 NOTE — ED Provider Notes (Signed)
[facility] Emergency Department Provider Note  ____________________________________________  Time seen: Approximately 8:24 PM  I have reviewed the triage vital signs and the nursing notes.   HISTORY  Chief Complaint Shortness of Breath    HPI Jamie Hardin is a 49 y.o. female with a history of asthma, chronic sinusitis, GERD who comes ED complaining of persistent shortness of breath for the past 2 weeks.  Reports recent Covid diagnosis about 8 days ago, had a negative Covid test 3 days ago.  Denies chest pain, has occasional nonproductive cough.  She was sent for repeat chest x-ray by her doctor and then told to come to the ED due to an abnormal result.  Reviewed EMR, noting finding of a small pulmonary nodule in the left chest.      Past Medical History:  Diagnosis Date  . Allergy   . Asthma   . Chronic sinusitis   . Dysthymic disorder   . GERD (gastroesophageal reflux disease)   . 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 Obesity   . Ovarian endometriosis   . Peripheral neuropathy   . 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 Torn meniscus      Patient Active Problem List   Diagnosis Date Noted  . Primary insomnia 11/27/2019  . Peripheral neuropathy, idiopathic 11/26/2019  . Bilateral occipital neuralgia 11/26/2019  . Myofascial pain syndrome 11/26/2019  . Hot flashes 07/25/2018  . Onychomycosis 07/25/2018  . 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 Newly recognized heart murmur 08/29/2017  . PAC (premature atrial contraction) 08/29/2017  . DDD (degenerative disc disease), cervical 07/11/2017  . Pain in joint involving ankle and foot 07/11/2017  . Low back pain 07/11/2017  . 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 Influenza vaccination declined by patient 01/15/2017  . Contusion of right knee 11/15/2016  . Subluxation of shoulder  joint 11/15/2016  . Abnormal mammogram of both breasts 02/04/2016  . History of pneumonia as indication for 23-polyvalent pneumococcal polysaccharide vaccine 01/14/2016  . Preventative health care 01/14/2016  . Facial pain, atypical 04/27/2015  . Eustachian tube dysfunction 04/27/2015  . 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 Abnormal thyroid function test 02/15/2015  . Rash 02/15/2015  . Breast cancer screening 01/12/2015  . Elevated blood-pressure reading without diagnosis of hypertension 01/12/2015  . GAD (generalized anxiety disorder) 01/12/2015  . OSA on CPAP 01/12/2015  . Allergy   . Asthma   . Morbid obesity ([place])   . Dysthymic disorder   . GERD (gastroesophageal reflux disease)   . Fibromyalgia 07/22/2014  . 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 Morbid obesity with BMI of 50.0-59.9, adult ([place]) 07/22/2014     Past Surgical History:  Procedure Laterality Date  . ABDOMINAL HYSTERECTOMY  2007   complete due to endometriosis  . CHOLECYSTECTOMY  01/2018  . 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 NASAL SINUS SURGERY  1996 and 1999   x 2     Prior to Admission medications   Medication Sig Start Date End Date Taking? Authorizing Provider  albuterol (ACCUNEB) 0.63 MG/3ML nebulizer solution USE 1 VIAL VIA NEBULIZER EVERY 4 HOURS AS NEEDED FOR WHEEZING 09/16/18   Laverle Hobby, MD  albuterol (VENTOLIN HFA) 108 (90 Base) MCG/ACT inhaler Inhale 2 puffs into the lungs every 6 (six) hours as needed for wheezing or shortness of  breath. 03/15/20   Delsa Grana, PA-C  amitriptyline (ELAVIL) 10 MG tablet Take 10 mg by mouth at bedtime.    [provider]  beclomethasone (QVAR) 40 MCG/ACT inhaler Inhale 2 puffs into the lungs 2 (two) times daily. 03/15/20   Delsa Grana, PA-C  calcium-vitamin D 250-100 MG-UNIT tablet Take 1 tablet by mouth 2 (two) times daily.    [provider]  Erenumab-aooe (AIMOVIG) 70  MG/ML SOAJ Inject 70 mg into the skin every 28 (twenty-eight) days. 11/26/19   [provider]  ibuprofen (ADVIL) 200 MG tablet Take 1-2 tablets (200-400 mg total) by mouth every 6 (six) hours as needed. 07/25/18   Lada, Satira Anis, MD  ipratropium-albuterol (DUONEB) 0.5-2.5 (3) MG/3ML SOLN Take 3 mLs by nebulization 3 (three) times daily as needed. 03/15/20   Delsa Grana, PA-C  Multiple Vitamins-Minerals (MULTIVITAMIN WITH MINERALS) tablet Take 1 tablet by mouth daily.    [provider]  ondansetron (ZOFRAN ODT) 8 MG disintegrating tablet Take 1 tablet (8 mg total) by mouth every 8 (eight) hours as needed for nausea or vomiting. 03/01/20   Margarette Canada, NP  predniSONE (DELTASONE) 10 MG tablet Take 6 tablets (60 mg total) by mouth daily for 2 days, THEN 5 tablets (50 mg total) daily for 2 days, THEN 4 tablets (40 mg total) daily for 2 days, THEN 3 tablets (30 mg total) daily for 2 days, THEN 2 tablets (20 mg total) daily for 2 days, THEN 1 tablet (10 mg total) daily for 2 days. 03/23/20 04/04/20  Carrie Mew, MD  SUMAtriptan (IMITREX) 50 MG tablet Take 1-2 tablets (50-100 mg total) by mouth every 2 (two) hours as needed for migraine. May repeat in 2 hours if headache persists or recurs. 07/17/19   Delsa Grana, PA-C  valACYclovir (VALTREX) 1000 MG tablet TAKE 2 TABLETS BY MOUTH AT FIRST SIGN OF OUTBREAK FOLLOWED BY 2 TABLETS MORE 12 HOURS LATER 02/19/20   Delsa Grana, PA-C  clonazePAM (KLONOPIN) 0.5 MG tablet Take 0.5 mg by mouth 2 (two) times daily as needed. 05/19/15 03/01/20  [provider]     Allergies Naproxen and Codeine   Family History  Problem Relation Age of Onset  . Fibromyalgia Mother   . Diabetes Mother   . Cancer Father        prostate  . Diabetes Maternal Aunt   . Diabetes Paternal Aunt   . Scleroderma Maternal Grandmother   . Heart disease Maternal Grandfather   . Hypertension Neg Hx   . Hyperlipidemia Neg Hx   . Stroke Neg Hx   . COPD Neg  Hx     Social History Social History   Tobacco Use  . Smoking status: Former Smoker    Packs/day: 1.00    Years: 20.00    Pack years: 20.00    Types: Cigarettes    Quit date: 04/04/2003    Years since quitting: 16.9  . Smokeless tobacco: Never Used  Vaping Use  . Vaping Use: Never used  Substance Use Topics  . Alcohol use: Yes    Comment: Very Rare  . Drug use: No    Review of Systems  Constitutional:   No fever or chills.  ENT:   No sore throat. No rhinorrhea. Cardiovascular:   No chest pain or syncope. Respiratory:   Positive shortness of breath and nonproductive cough. Gastrointestinal:   Negative for abdominal pain, vomiting and diarrhea.  Musculoskeletal:   Negative for focal pain or swelling All other systems reviewed  and are negative except as documented above in ROS and HPI.  ____________________________________________   PHYSICAL EXAM:  VITAL SIGNS: ED Triage Vitals  Enc Vitals Group     BP 03/23/20 1551 118/79     Pulse Rate 03/23/20 1551 (!) 107     Resp 03/23/20 1551 19     Temp 03/23/20 1551 98.4 F (36.9 C)     Temp Source 03/23/20 1551 Oral     SpO2 03/23/20 1551 97 %     Weight 03/23/20 1552 290 lb (131.5 kg)     Height 03/23/20 1552 5\' 5"  (1.651 m)     Head Circumference --      Peak Flow --      Pain Score 03/23/20 1552 0     Pain Loc --      Pain Edu? --      Excl. in [place]? --     Vital signs reviewed, nursing assessments reviewed.   Constitutional:   Alert and oriented. Non-toxic appearance. Eyes:   Conjunctivae are normal. EOMI. PERRL. ENT      Head:   Normocephalic and atraumatic.      Nose:   Wearing a mask.      Mouth/Throat:   Wearing a mask.      Neck:   No meningismus. Full ROM. Hematological/Lymphatic/Immunilogical:   No cervical lymphadenopathy. Cardiovascular:   RRR. Symmetric bilateral radial and DP pulses.  No murmurs. Cap refill less than 2 seconds. Respiratory:   Normal respiratory effort without  tachypnea/retractions. Breath sounds are clear and equal bilaterally. No wheezes/rales/rhonchi.  There is mild inducible wheezing and cough with FEV1 maneuver Gastrointestinal:   Soft and nontender. Non distended. There is no CVA tenderness.  No rebound, rigidity, or guarding. Musculoskeletal:   Normal range of motion in all extremities. No joint effusions.  No lower extremity tenderness.  No edema. Neurologic:   Normal speech and language.  Motor grossly intact. No acute focal neurologic deficits are appreciated.  Skin:    Skin is warm, dry and intact. No rash noted.  No petechiae, purpura, or bullae.  ____________________________________________    LABS (pertinent positives/negatives) (all labs ordered are listed, but only abnormal results are displayed) Labs Reviewed  COMPREHENSIVE METABOLIC PANEL  CBC WITH DIFFERENTIAL/PLATELET   ____________________________________________   EKG    ____________________________________________    RADIOLOGY  No results found.  ____________________________________________   PROCEDURES Procedures  ____________________________________________    CLINICAL IMPRESSION / ASSESSMENT AND PLAN / ED COURSE  Medications ordered in the ED: Medications - No data to display  Pertinent labs & imaging results that were available during my care of the patient were reviewed by me and considered in my medical decision making (see chart for details).  Jamie Hardin was evaluated in Emergency Department on 03/23/2020 for the symptoms described in the history of present illness. She was evaluated in the context of the global COVID-19 pandemic, which necessitated consideration that the patient might be at risk for infection with the SARS-CoV-2 virus that causes COVID-19. Institutional protocols and algorithms that pertain to the evaluation of patients at risk for COVID-19 are in a state of rapid change based on information released by regulatory bodies  including the CDC and federal and state organizations. These policies and algorithms were followed during the patient's care in the ED.   Patient presents with shortness of breath in the setting of recent viral respiratory illness with Covid. Considering the patient's symptoms, medical history, and physical examination today, I have  low suspicion for ACS, PE, TAD, pneumothorax, carditis, mediastinitis, pneumonia, CHF, or sepsis.  Vital signs are normal.  Low risk by Wells criteria and PERC rule.  Exam clinically consistent with bronchitis.  Will prescribe a prednisone taper, she will continue using her inhalers at home.  Chest x-ray did suggest possibly obtaining CT scan for further evaluation of the nodule.  Offered this to the patient but she is more comfortable treating her current symptoms, following up with her doctor.      ____________________________________________   FINAL CLINICAL IMPRESSION(S) / ED DIAGNOSES    Final diagnoses:  Acute viral bronchitis     ED Discharge Orders         Ordered    predniSONE (DELTASONE) 10 MG tablet        03/23/20 2024          Portions of this note were generated with dragon dictation software. Dictation errors may occur despite best attempts at proofreading.   Carrie Mew, MD 03/23/20 2027

## 2020-03-29 DIAGNOSIS — E669 Obesity, unspecified: Secondary | ICD-10-CM | POA: Diagnosis not present

## 2020-03-29 DIAGNOSIS — R1011 Right upper quadrant pain: Secondary | ICD-10-CM | POA: Diagnosis not present

## 2020-03-29 DIAGNOSIS — Z87891 Personal history of nicotine dependence: Secondary | ICD-10-CM | POA: Diagnosis not present

## 2020-03-29 DIAGNOSIS — D72829 Elevated white blood cell count, unspecified: Secondary | ICD-10-CM | POA: Diagnosis not present

## 2020-03-29 DIAGNOSIS — K529 Noninfective gastroenteritis and colitis, unspecified: Secondary | ICD-10-CM | POA: Diagnosis not present

## 2020-03-29 DIAGNOSIS — K219 Gastro-esophageal reflux disease without esophagitis: Secondary | ICD-10-CM | POA: Diagnosis not present

## 2020-03-30 DIAGNOSIS — K912 Postsurgical malabsorption, not elsewhere classified: Secondary | ICD-10-CM | POA: Diagnosis not present

## 2020-03-30 DIAGNOSIS — Z48815 Encounter for surgical aftercare following surgery on the digestive system: Secondary | ICD-10-CM | POA: Diagnosis not present

## 2020-03-30 DIAGNOSIS — Z713 Dietary counseling and surveillance: Secondary | ICD-10-CM | POA: Diagnosis not present

## 2020-03-30 DIAGNOSIS — Z9884 Bariatric surgery status: Secondary | ICD-10-CM | POA: Diagnosis not present

## 2020-04-09 DIAGNOSIS — Z23 Encounter for immunization: Secondary | ICD-10-CM | POA: Diagnosis not present

## 2020-04-13 DIAGNOSIS — Z8719 Personal history of other diseases of the digestive system: Secondary | ICD-10-CM | POA: Diagnosis not present

## 2020-04-13 DIAGNOSIS — G43909 Migraine, unspecified, not intractable, without status migrainosus: Secondary | ICD-10-CM | POA: Diagnosis not present

## 2020-04-13 DIAGNOSIS — J9811 Atelectasis: Secondary | ICD-10-CM | POA: Diagnosis not present

## 2020-04-13 DIAGNOSIS — J45909 Unspecified asthma, uncomplicated: Secondary | ICD-10-CM | POA: Diagnosis not present

## 2020-04-13 DIAGNOSIS — R1011 Right upper quadrant pain: Secondary | ICD-10-CM | POA: Diagnosis not present

## 2020-04-13 DIAGNOSIS — Z87891 Personal history of nicotine dependence: Secondary | ICD-10-CM | POA: Diagnosis not present

## 2020-04-13 DIAGNOSIS — Z9989 Dependence on other enabling machines and devices: Secondary | ICD-10-CM | POA: Diagnosis not present

## 2020-04-13 DIAGNOSIS — Z9049 Acquired absence of other specified parts of digestive tract: Secondary | ICD-10-CM | POA: Diagnosis not present

## 2020-04-13 DIAGNOSIS — R109 Unspecified abdominal pain: Secondary | ICD-10-CM | POA: Diagnosis not present

## 2020-04-13 DIAGNOSIS — Z6841 Body Mass Index (BMI) 40.0 and over, adult: Secondary | ICD-10-CM | POA: Diagnosis not present

## 2020-04-13 DIAGNOSIS — R112 Nausea with vomiting, unspecified: Secondary | ICD-10-CM | POA: Diagnosis not present

## 2020-04-13 DIAGNOSIS — Z9884 Bariatric surgery status: Secondary | ICD-10-CM | POA: Diagnosis not present

## 2020-04-13 DIAGNOSIS — R1013 Epigastric pain: Secondary | ICD-10-CM | POA: Diagnosis not present

## 2020-04-14 DIAGNOSIS — Z9884 Bariatric surgery status: Secondary | ICD-10-CM | POA: Diagnosis not present

## 2020-04-14 DIAGNOSIS — K449 Diaphragmatic hernia without obstruction or gangrene: Secondary | ICD-10-CM | POA: Diagnosis not present

## 2020-04-14 DIAGNOSIS — K567 Ileus, unspecified: Secondary | ICD-10-CM | POA: Diagnosis not present

## 2020-04-14 DIAGNOSIS — K219 Gastro-esophageal reflux disease without esophagitis: Secondary | ICD-10-CM | POA: Diagnosis not present

## 2020-05-04 DIAGNOSIS — K912 Postsurgical malabsorption, not elsewhere classified: Secondary | ICD-10-CM | POA: Diagnosis not present

## 2020-05-04 DIAGNOSIS — G8929 Other chronic pain: Secondary | ICD-10-CM | POA: Diagnosis not present

## 2020-05-04 DIAGNOSIS — Z9884 Bariatric surgery status: Secondary | ICD-10-CM | POA: Diagnosis not present

## 2020-05-04 DIAGNOSIS — R1011 Right upper quadrant pain: Secondary | ICD-10-CM | POA: Diagnosis not present

## 2020-05-04 DIAGNOSIS — Z6841 Body Mass Index (BMI) 40.0 and over, adult: Secondary | ICD-10-CM | POA: Diagnosis not present

## 2020-05-04 DIAGNOSIS — Z79899 Other long term (current) drug therapy: Secondary | ICD-10-CM | POA: Diagnosis not present

## 2020-05-04 DIAGNOSIS — Z87891 Personal history of nicotine dependence: Secondary | ICD-10-CM | POA: Diagnosis not present

## 2020-05-04 DIAGNOSIS — R634 Abnormal weight loss: Secondary | ICD-10-CM | POA: Diagnosis not present

## 2020-05-11 DIAGNOSIS — Z9884 Bariatric surgery status: Secondary | ICD-10-CM | POA: Diagnosis not present

## 2020-05-11 DIAGNOSIS — K912 Postsurgical malabsorption, not elsewhere classified: Secondary | ICD-10-CM | POA: Diagnosis not present

## 2020-05-11 DIAGNOSIS — Z713 Dietary counseling and surveillance: Secondary | ICD-10-CM | POA: Diagnosis not present

## 2020-05-12 DIAGNOSIS — R1310 Dysphagia, unspecified: Secondary | ICD-10-CM | POA: Diagnosis not present

## 2020-05-12 DIAGNOSIS — Z9884 Bariatric surgery status: Secondary | ICD-10-CM | POA: Diagnosis not present

## 2020-05-12 DIAGNOSIS — K449 Diaphragmatic hernia without obstruction or gangrene: Secondary | ICD-10-CM | POA: Diagnosis not present

## 2020-05-12 DIAGNOSIS — K2100 Gastro-esophageal reflux disease with esophagitis, without bleeding: Secondary | ICD-10-CM | POA: Diagnosis not present

## 2020-05-12 DIAGNOSIS — K219 Gastro-esophageal reflux disease without esophagitis: Secondary | ICD-10-CM | POA: Diagnosis not present

## 2020-05-12 IMAGING — RF DG FLUORO GUIDE SPINAL/SI JT INJ*L*
1 series · 1 of 1 positions shown · non-contrast
Comparison: none

CLINICAL DATA: Intractable chronic migraines

[Series 1: cp_standard · 0.18mm/px · 1 of 1 slices shown]
[im 1/1]
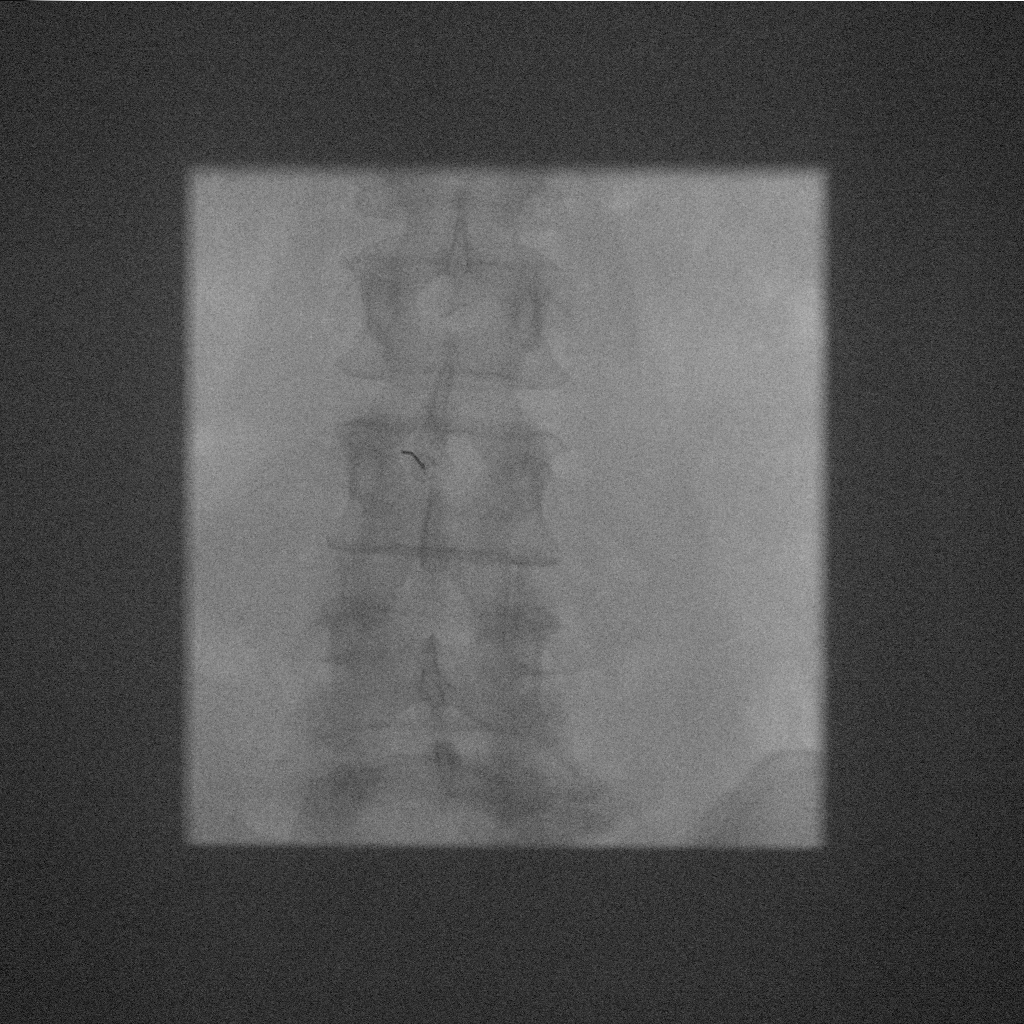

[1 of 1 positions shown; findings below may reference images not displayed]

EXAM:
DIAGNOSTIC LUMBAR PUNCTURE UNDER FLUOROSCOPIC GUIDANCE

FLUOROSCOPY TIME:  Fluoroscopy Time:  0.2 minute

Radiation Exposure Index (if provided by the fluoroscopic device):
3.4 mGy

Number of Acquired Spot Images: 0

PROCEDURE:
Informed consent was obtained from the patient prior to the
procedure, including potential complications of headache, allergy,
and pain. With the patient prone, the lower back was prepped with
Betadine. 1% Lidocaine was used for local anesthesia. Lumbar
puncture was performed at the L3-4 level using a 22 gauge needle
with return of clear CSF with an opening pressure of 15 cm water. 9
ml of CSF were obtained for laboratory studies. Closing pressure was
not measured as the patient was nauseous. The patient tolerated the
procedure well and there were no apparent complications.
IMPRESSION: Successful fluoroscopic guided diagnostic lumbar puncture.

## 2020-05-22 DIAGNOSIS — Z20822 Contact with and (suspected) exposure to covid-19: Secondary | ICD-10-CM | POA: Diagnosis not present

## 2020-05-24 DIAGNOSIS — Z48815 Encounter for surgical aftercare following surgery on the digestive system: Secondary | ICD-10-CM | POA: Diagnosis not present

## 2020-05-24 DIAGNOSIS — Z9884 Bariatric surgery status: Secondary | ICD-10-CM | POA: Diagnosis not present

## 2020-05-24 DIAGNOSIS — Z713 Dietary counseling and surveillance: Secondary | ICD-10-CM | POA: Diagnosis not present

## 2020-05-24 DIAGNOSIS — Q433 Congenital malformations of intestinal fixation: Secondary | ICD-10-CM | POA: Diagnosis not present

## 2020-05-29 DIAGNOSIS — Z20822 Contact with and (suspected) exposure to covid-19: Secondary | ICD-10-CM | POA: Diagnosis not present

## 2020-06-01 DIAGNOSIS — Z48815 Encounter for surgical aftercare following surgery on the digestive system: Secondary | ICD-10-CM | POA: Diagnosis not present

## 2020-06-01 DIAGNOSIS — Z9884 Bariatric surgery status: Secondary | ICD-10-CM | POA: Diagnosis not present

## 2020-06-01 DIAGNOSIS — K219 Gastro-esophageal reflux disease without esophagitis: Secondary | ICD-10-CM | POA: Diagnosis not present

## 2020-06-15 DIAGNOSIS — G4733 Obstructive sleep apnea (adult) (pediatric): Secondary | ICD-10-CM | POA: Diagnosis not present

## 2020-07-01 DIAGNOSIS — R140 Abdominal distension (gaseous): Secondary | ICD-10-CM | POA: Diagnosis not present

## 2020-07-01 DIAGNOSIS — R1011 Right upper quadrant pain: Secondary | ICD-10-CM | POA: Diagnosis not present

## 2020-07-01 DIAGNOSIS — R935 Abnormal findings on diagnostic imaging of other abdominal regions, including retroperitoneum: Secondary | ICD-10-CM | POA: Diagnosis not present

## 2020-07-09 DIAGNOSIS — Z9884 Bariatric surgery status: Secondary | ICD-10-CM | POA: Diagnosis not present

## 2020-07-09 DIAGNOSIS — R1011 Right upper quadrant pain: Secondary | ICD-10-CM | POA: Diagnosis not present

## 2020-07-09 DIAGNOSIS — K6389 Other specified diseases of intestine: Secondary | ICD-10-CM | POA: Diagnosis not present

## 2020-07-23 DIAGNOSIS — G243 Spasmodic torticollis: Secondary | ICD-10-CM | POA: Diagnosis not present

## 2020-07-23 DIAGNOSIS — G629 Polyneuropathy, unspecified: Secondary | ICD-10-CM | POA: Diagnosis not present

## 2020-07-23 DIAGNOSIS — G43719 Chronic migraine without aura, intractable, without status migrainosus: Secondary | ICD-10-CM | POA: Diagnosis not present

## 2020-08-18 DIAGNOSIS — Z48815 Encounter for surgical aftercare following surgery on the digestive system: Secondary | ICD-10-CM | POA: Diagnosis not present

## 2020-08-18 DIAGNOSIS — Z713 Dietary counseling and surveillance: Secondary | ICD-10-CM | POA: Diagnosis not present

## 2020-08-18 DIAGNOSIS — Z9884 Bariatric surgery status: Secondary | ICD-10-CM | POA: Diagnosis not present

## 2020-08-18 DIAGNOSIS — K912 Postsurgical malabsorption, not elsewhere classified: Secondary | ICD-10-CM | POA: Diagnosis not present

## 2020-08-19 DIAGNOSIS — Z1152 Encounter for screening for COVID-19: Secondary | ICD-10-CM | POA: Diagnosis not present

## 2020-08-24 DIAGNOSIS — Z1152 Encounter for screening for COVID-19: Secondary | ICD-10-CM | POA: Diagnosis not present

## 2020-09-01 DIAGNOSIS — R140 Abdominal distension (gaseous): Secondary | ICD-10-CM | POA: Diagnosis not present

## 2020-09-01 DIAGNOSIS — R1011 Right upper quadrant pain: Secondary | ICD-10-CM | POA: Diagnosis not present

## 2020-09-01 DIAGNOSIS — R935 Abnormal findings on diagnostic imaging of other abdominal regions, including retroperitoneum: Secondary | ICD-10-CM | POA: Diagnosis not present

## 2020-09-14 ENCOUNTER — Ambulatory Visit: Payer: BC Managed Care – PPO | Admitting: Family Medicine

## 2020-09-27 DIAGNOSIS — Z1152 Encounter for screening for COVID-19: Secondary | ICD-10-CM | POA: Diagnosis not present

## 2020-10-06 DIAGNOSIS — Z20822 Contact with and (suspected) exposure to covid-19: Secondary | ICD-10-CM | POA: Diagnosis not present

## 2020-11-10 DIAGNOSIS — J00 Acute nasopharyngitis [common cold]: Secondary | ICD-10-CM | POA: Diagnosis not present

## 2020-11-24 DIAGNOSIS — G43719 Chronic migraine without aura, intractable, without status migrainosus: Secondary | ICD-10-CM | POA: Diagnosis not present

## 2020-11-24 DIAGNOSIS — G629 Polyneuropathy, unspecified: Secondary | ICD-10-CM | POA: Diagnosis not present

## 2020-11-24 DIAGNOSIS — M797 Fibromyalgia: Secondary | ICD-10-CM | POA: Diagnosis not present

## 2020-11-24 DIAGNOSIS — F5101 Primary insomnia: Secondary | ICD-10-CM | POA: Diagnosis not present

## 2020-11-24 DIAGNOSIS — G243 Spasmodic torticollis: Secondary | ICD-10-CM | POA: Insufficient documentation

## 2020-12-13 DIAGNOSIS — Z9884 Bariatric surgery status: Secondary | ICD-10-CM | POA: Diagnosis not present

## 2020-12-13 DIAGNOSIS — K912 Postsurgical malabsorption, not elsewhere classified: Secondary | ICD-10-CM | POA: Diagnosis not present

## 2020-12-13 DIAGNOSIS — Z713 Dietary counseling and surveillance: Secondary | ICD-10-CM | POA: Diagnosis not present

## 2020-12-20 DIAGNOSIS — G4733 Obstructive sleep apnea (adult) (pediatric): Secondary | ICD-10-CM | POA: Diagnosis not present

## 2020-12-22 ENCOUNTER — Encounter: Payer: Self-pay | Admitting: Family Medicine

## 2020-12-22 ENCOUNTER — Telehealth (INDEPENDENT_AMBULATORY_CARE_PROVIDER_SITE_OTHER): Payer: BC Managed Care – PPO | Admitting: Family Medicine

## 2020-12-22 DIAGNOSIS — F341 Dysthymic disorder: Secondary | ICD-10-CM | POA: Diagnosis not present

## 2020-12-22 MED ORDER — CLONAZEPAM 0.5 MG PO TABS: 0.5000 mg | ORAL_TABLET | Freq: Two times a day (BID) | ORAL | 0 refills | Status: DC | PRN

## 2020-12-22 NOTE — Patient Instructions (Signed)
It was great to see you!  Our plans for today:  - We refilled your klonopin to help until you can see your behavioral health specialist. - Talk with your neurologist about going back to 25mg  for your elavil or switching to a different maintenance medication.  Take care and seek immediate care sooner if you develop any concerns.   Dr. Ky Barban

## 2020-12-22 NOTE — Progress Notes (Signed)
Virtual Visit via Video Note  I connected with Jamie Hardin on 12/22/20 at  1:20 PM EDT by a video enabled telemedicine application and verified that I am speaking with the correct person using two identifiers.  Location: Patient: car Provider: [facility]   I discussed the limitations of evaluation and management by telemedicine and the availability of in person appointments. The patient expressed understanding and agreed to proceed.  History of Present Illness:  Anxiety - Medications: elavil - Taking: currently weaning off, on 10mg  currently. - Counseling: yes - Previous hospitalizations: no - Symptoms: increase in panic attacks, feeling overwhelmed, difficulty with sleep - Current stressors: daughter with recent suicide attempt, father in law with recent stroke, working 3 jobs, currently in school, car recently broke down - Coping Mechanisms: meditation - upcoming appt 10/18 with behavioral health specialist, neuro next month.   Depression screen [facility] 2/9 12/22/2020 03/15/2020 07/17/2019  Decreased Interest 0 0 0  Down, Depressed, Hopeless 3 0 0  PHQ - 2 Score 3 0 0  Altered sleeping 3 - 0  Tired, decreased energy 3 - 0  Change in appetite 0 - 0  Feeling bad or failure about yourself  0 - 0  Trouble concentrating 1 - 0  Moving slowly or fidgety/restless 0 - 0  Suicidal thoughts 0 - 0  PHQ-9 Score 10 - 0  Difficult doing work/chores Somewhat difficult - Not difficult at all   GAD 7 : Generalized Anxiety Score 02/15/2015  Nervous, Anxious, on Edge 3  Control/stop worrying 3  Worry too much - different things 3  Trouble relaxing 3  Restless 0  Easily annoyed or irritable 3  Afraid - awful might happen 2  Total GAD 7 Score 17  Anxiety Difficulty Very difficult     Observations/Objective:  Well appearing, in NAD. Appropriate mood and affect.  Assessment and Plan:  Anxiety Increase in symptoms with increase in stressors. Recent wean in elavil likely contributing as well.  Recommend discussing dose increase in elavil with neurologist/prescriber and behavioral health specialist. Previously with appropriate klonopin use with good relief, will provide short klonopin refill, not to be used routinely or long term. Patient aware.    I discussed the assessment and treatment plan with the patient. The patient was provided an opportunity to ask questions and all were answered. The patient agreed with the plan and demonstrated an understanding of the instructions.   The patient was advised to call back or seek an in-person evaluation if the symptoms worsen or if the condition fails to improve as anticipated.  I provided 8 minutes of non-face-to-face time during this encounter.   Myles Gip, DO

## 2021-01-02 DIAGNOSIS — Z20822 Contact with and (suspected) exposure to covid-19: Secondary | ICD-10-CM | POA: Diagnosis not present

## 2021-01-19 DIAGNOSIS — Z79899 Other long term (current) drug therapy: Secondary | ICD-10-CM | POA: Diagnosis not present

## 2021-01-19 DIAGNOSIS — F331 Major depressive disorder, recurrent, moderate: Secondary | ICD-10-CM | POA: Diagnosis not present

## 2021-01-19 DIAGNOSIS — Z1389 Encounter for screening for other disorder: Secondary | ICD-10-CM | POA: Diagnosis not present

## 2021-01-19 DIAGNOSIS — G4733 Obstructive sleep apnea (adult) (pediatric): Secondary | ICD-10-CM | POA: Diagnosis not present

## 2021-01-19 DIAGNOSIS — F411 Generalized anxiety disorder: Secondary | ICD-10-CM | POA: Diagnosis not present

## 2021-02-03 DIAGNOSIS — Z1231 Encounter for screening mammogram for malignant neoplasm of breast: Secondary | ICD-10-CM | POA: Diagnosis not present

## 2021-02-03 DIAGNOSIS — Z006 Encounter for examination for normal comparison and control in clinical research program: Secondary | ICD-10-CM | POA: Diagnosis not present

## 2021-02-03 LAB — HM MAMMOGRAPHY

## 2021-02-16 IMAGING — CR DG CHEST 2V
1 series · 2 of 2 positions shown · non-contrast
Comparison: 03/07/2020 and prior.

CLINICAL DATA: cough, f/up imaging shows possible infiltrate in mid
left lateral lung following

EXAM:
CHEST - 2 VIEW

[Series 1: dg chest 2 view · 0.14mm/px · 2 of 2 slices shown]
[im 1/2]
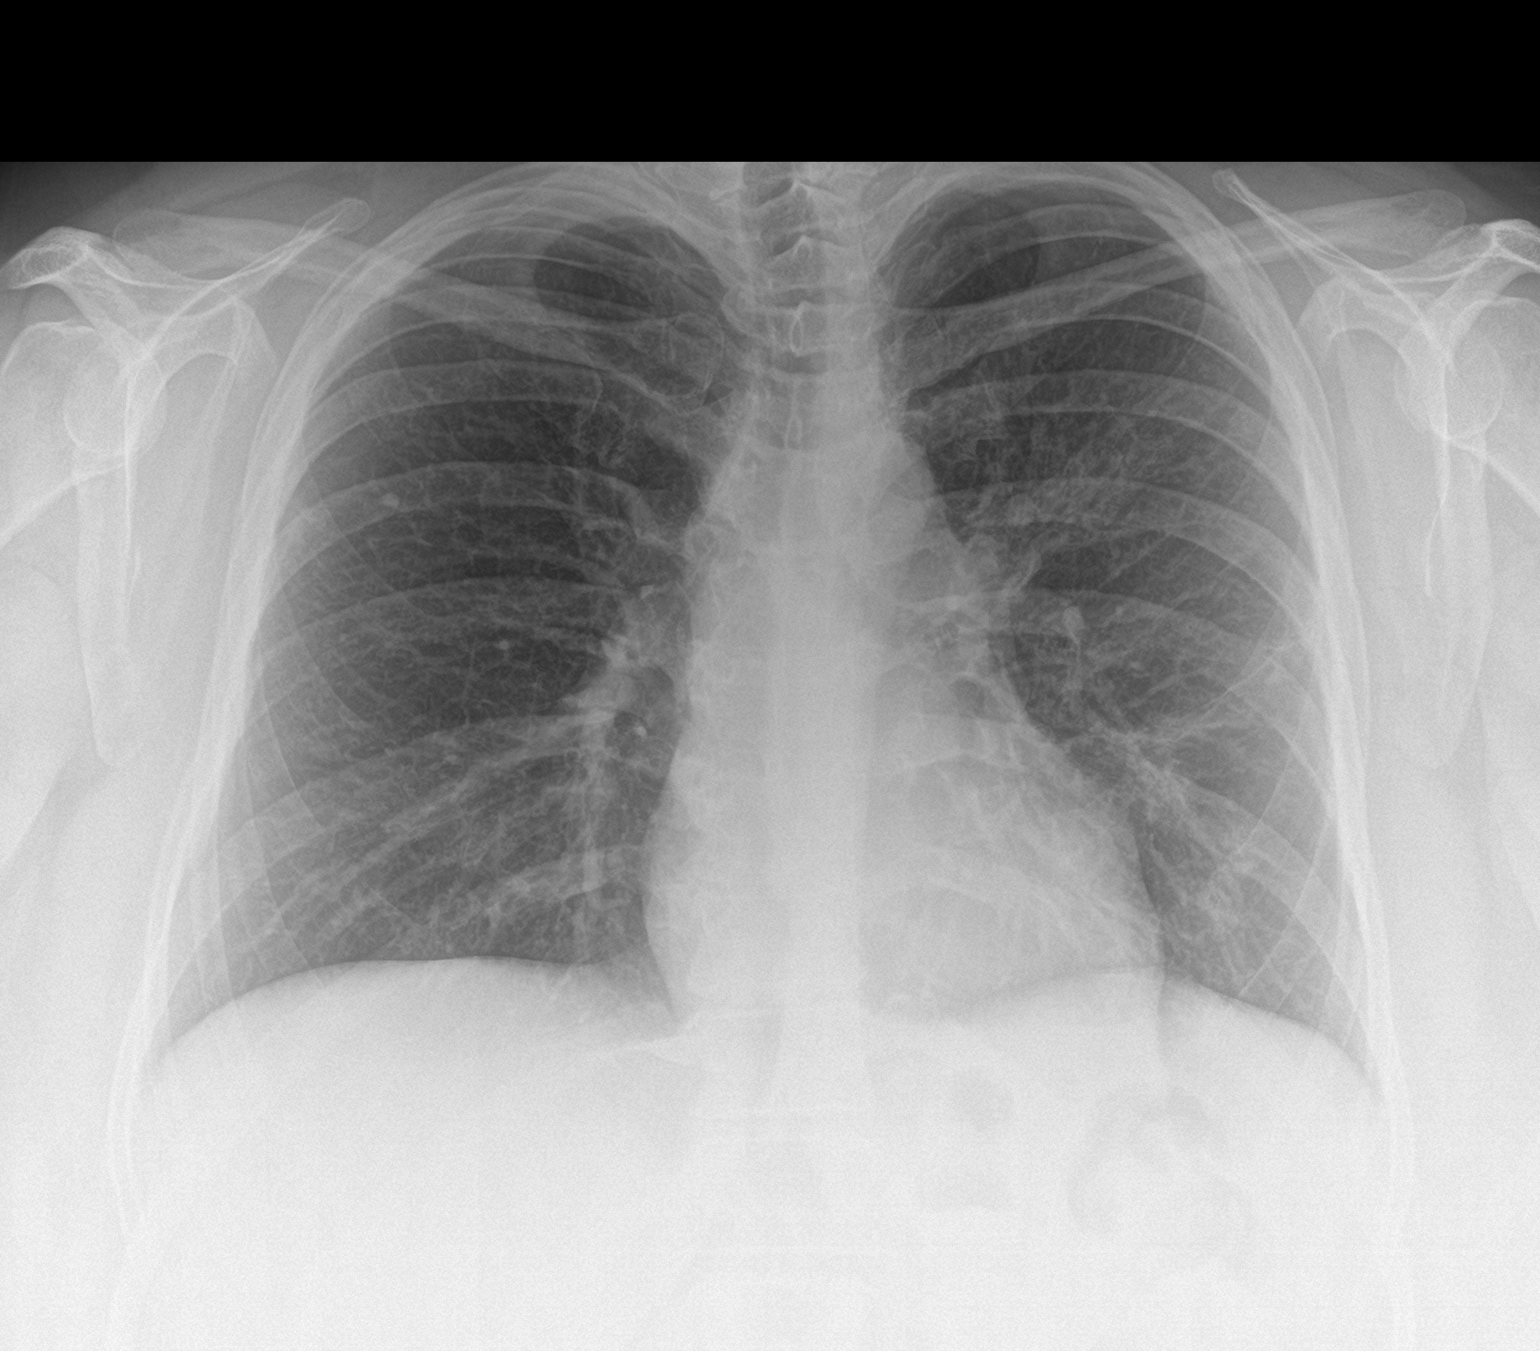
[im 2/2]
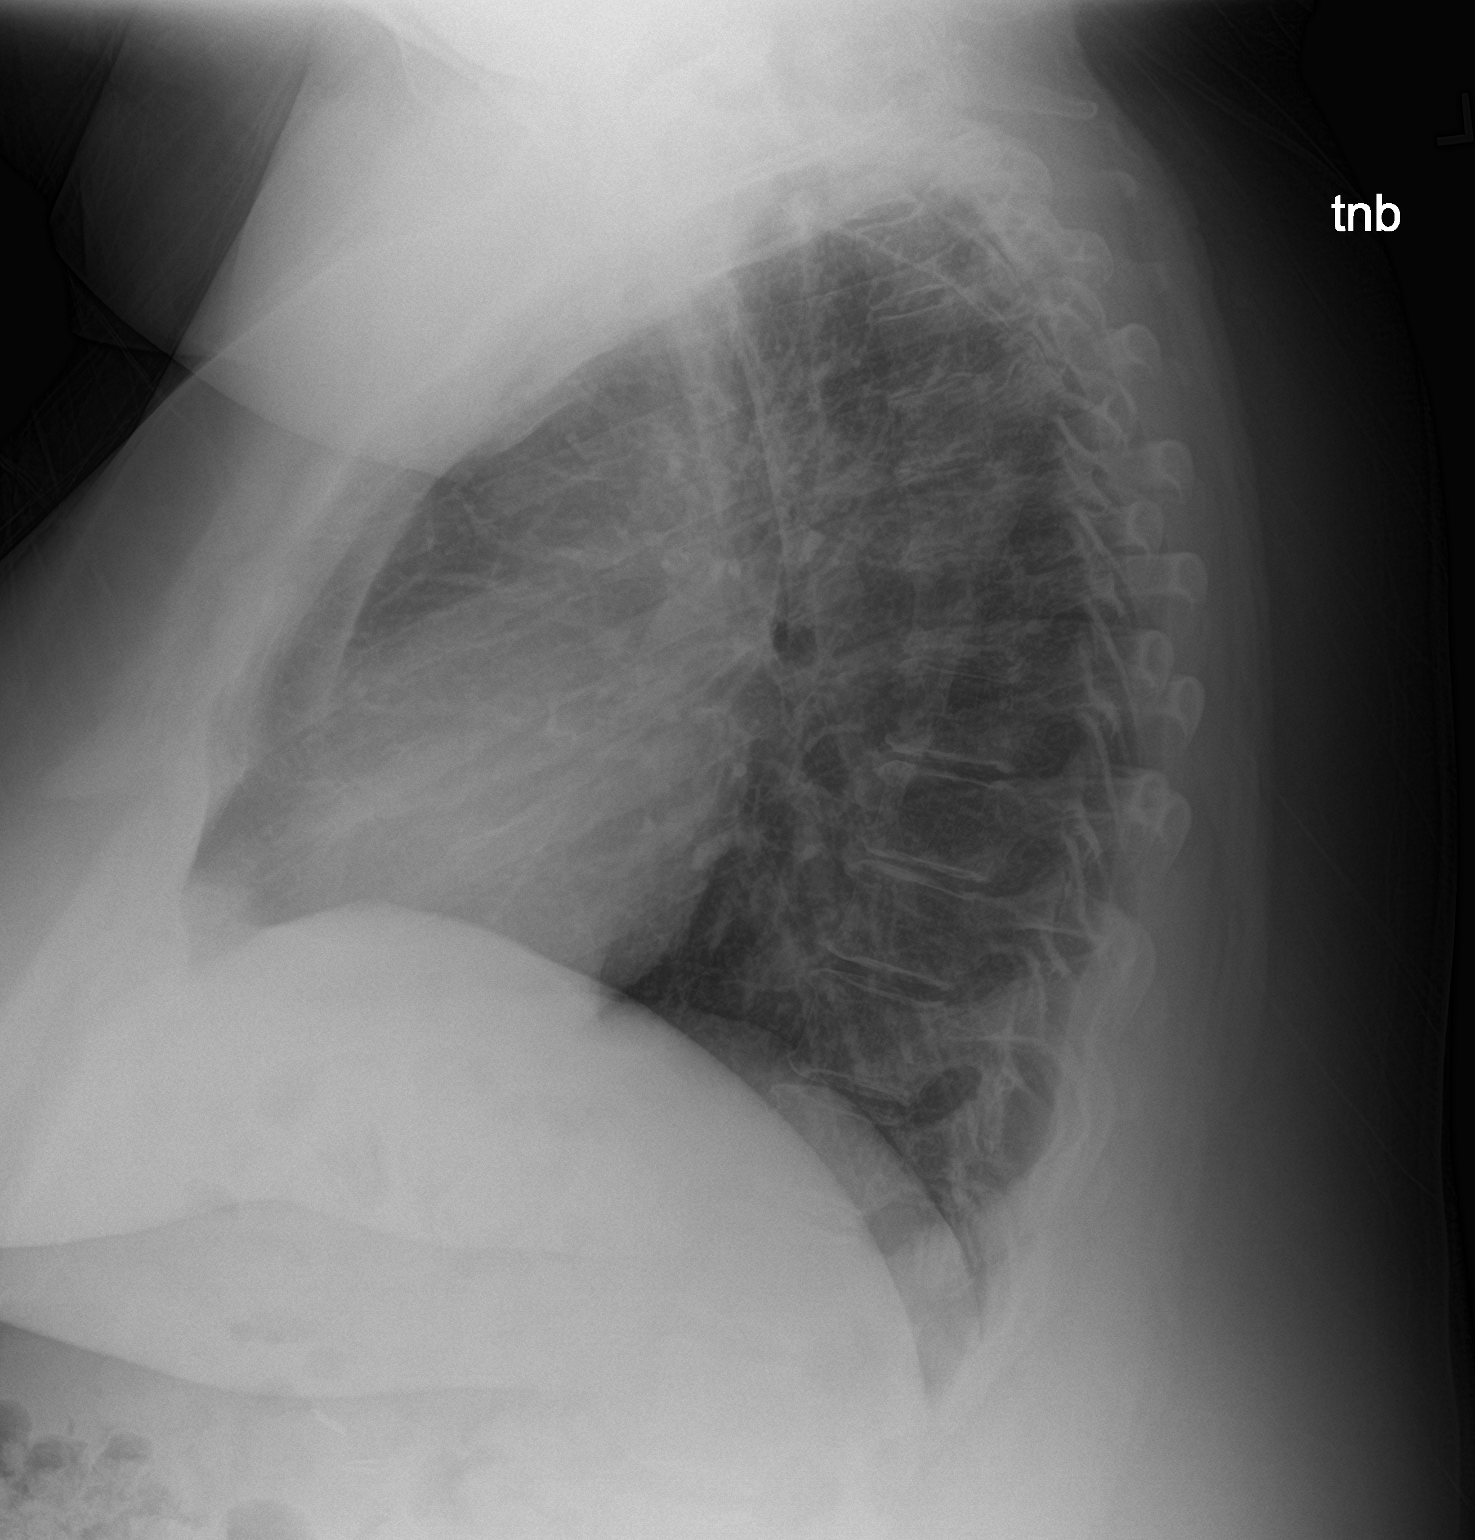

[2 of 2 positions shown; findings below may reference images not displayed]

FINDINGS: No pneumothorax or pleural effusion. Right upper lung calcified
granuloma. Redemonstration of left lateral midlung opacity. No new
focal consolidation. Stable cardiomediastinal silhouette.
IMPRESSION: Persistent small left lateral midlung opacity. Consider CT chest for
further evaluation.

## 2021-02-19 DIAGNOSIS — G4733 Obstructive sleep apnea (adult) (pediatric): Secondary | ICD-10-CM | POA: Diagnosis not present

## 2021-03-07 DIAGNOSIS — Z20822 Contact with and (suspected) exposure to covid-19: Secondary | ICD-10-CM | POA: Diagnosis not present

## 2021-03-17 ENCOUNTER — Encounter: Payer: BC Managed Care – PPO | Admitting: Family Medicine

## 2021-03-18 ENCOUNTER — Encounter: Payer: BC Managed Care – PPO | Admitting: Nurse Practitioner

## 2021-04-01 ENCOUNTER — Other Ambulatory Visit: Payer: Self-pay

## 2021-04-01 ENCOUNTER — Encounter: Payer: Self-pay | Admitting: Nurse Practitioner

## 2021-04-01 ENCOUNTER — Ambulatory Visit (INDEPENDENT_AMBULATORY_CARE_PROVIDER_SITE_OTHER): Payer: BC Managed Care – PPO | Admitting: Nurse Practitioner

## 2021-04-01 VITALS — BP 126/84 | HR 84 | Temp 97.9°F | Resp 16 | Ht 65.0 in | Wt 287.8 lb

## 2021-04-01 DIAGNOSIS — Z0000 Encounter for general adult medical examination without abnormal findings: Secondary | ICD-10-CM

## 2021-04-01 DIAGNOSIS — Z130 Encounter for screening for diseases of the blood and blood-forming organs and certain disorders involving the immune mechanism: Secondary | ICD-10-CM

## 2021-04-01 DIAGNOSIS — Z131 Encounter for screening for diabetes mellitus: Secondary | ICD-10-CM | POA: Diagnosis not present

## 2021-04-01 DIAGNOSIS — Z13220 Encounter for screening for lipoid disorders: Secondary | ICD-10-CM

## 2021-04-01 DIAGNOSIS — Z5181 Encounter for therapeutic drug level monitoring: Secondary | ICD-10-CM

## 2021-04-01 DIAGNOSIS — Z1211 Encounter for screening for malignant neoplasm of colon: Secondary | ICD-10-CM

## 2021-04-01 NOTE — Progress Notes (Signed)
Name: Jamie Hardin   MRN: [id number]    DOB: 03-Sep-1970   Date:04/01/2021       Progress Note  Subjective  Chief Complaint  Chief Complaint  Patient presents with   Annual Exam    HPI  Patient presents for annual CPE.  Diet: Well balanced diet, including dairy Exercise: walk once a week, discussed increasing physical activity to 150 min a week.    Stress:  She says she does have a lot of stress.  She says she is in therapy for this and takes klonopin.   [place] Office Visit from 03/15/2020 in [facility]  AUDIT-C Score 0      Depression: Phq 9 is  negative Depression screen [facility] 2/9 04/01/2021 12/22/2020 03/15/2020 07/17/2019 04/18/2019  Decreased Interest 0 0 0 0 0  Down, Depressed, Hopeless 0 3 0 0 0  PHQ - 2 Score 0 3 0 0 0  Altered sleeping 0 3 - 0 0  Tired, decreased energy 0 3 - 0 0  Change in appetite 0 0 - 0 0  Feeling bad or failure about yourself  0 0 - 0 0  Trouble concentrating 0 1 - 0 0  Moving slowly or fidgety/restless 0 0 - 0 0  Suicidal thoughts 0 0 - 0 0  PHQ-9 Score 0 10 - 0 0  Difficult doing work/chores Not difficult at all Somewhat difficult - Not difficult at all Not difficult at all   Hypertension: BP Readings from Last 3 Encounters:  04/01/21 126/84  03/23/20 129/81  03/15/20 120/76   Obesity: Wt Readings from Last 3 Encounters:  04/01/21 287 lb 12.8 oz (130.5 kg)  03/23/20 290 lb (131.5 kg)  03/15/20 294 lb 1.6 oz (133.4 kg)   BMI Readings from Last 3 Encounters:  04/01/21 47.89 kg/m  03/23/20 48.26 kg/m  03/15/20 48.94 kg/m     Vaccines:  HPV: up to at age 87 , ask insurance if age between 31-45  Shingrix: 73-64 yo and ask insurance if covered when patient above 47 yo Pneumonia:  educated and discussed with patient. Flu:  educated and discussed with patient.  Hep C Screening: 04/27/2015 STD testing and prevention (HIV/chl/gon/syphilis): 04/27/2015 Intimate partner violence:none Sexual History :  husband, 18 years Menstrual History/LMP/Abnormal Bleeding: hysterectomy Incontinence Symptoms: stress incontinence  Breast cancer:  - Last Mammogram: done at [facility] on 02/03/21 - BRCA gene screening: none  Osteoporosis: Discussed high calcium and vitamin D supplementation, weight bearing exercises  Cervical cancer screening: 02/10/2015, has had hysterectomy  Skin cancer: Discussed monitoring for atypical lesions  Colorectal cancer: going to schedule her colonoscopy  Lung cancer:  Low Dose CT Chest recommended if Age 22-80 years, 20 pack-year currently smoking OR have quit w/in 15years. Patient does not qualify.   ECG: 03/24/2020  Advanced Care Planning: A voluntary discussion about advance care planning including the explanation and discussion of advance directives.  Discussed health care proxy and Living will, and the patient was able to identify a health care proxy as Angila Wombles (husband).    Lipids: Lab Results  Component Value Date   CHOL 142 01/16/2018   CHOL 175 01/15/2017   CHOL 152 01/14/2016   Lab Results  Component Value Date   HDL 38 (L) 01/16/2018   HDL 64 01/15/2017   HDL 51 01/14/2016   Lab Results  Component Value Date   LDLCALC 84 01/16/2018   [place] 92 01/15/2017   [place] 81 01/14/2016   Lab Results  Component Value Date   TRIG 104 01/16/2018   TRIG 91 01/15/2017   TRIG 101 01/14/2016   Lab Results  Component Value Date   CHOLHDL 3.7 01/16/2018   CHOLHDL 2.7 01/15/2017   CHOLHDL 3.0 01/14/2016   No results found for: LDLDIRECT  Glucose: Glucose  Date Value Ref Range Status  04/13/2012 95 65 - 99 mg/dL Final   Glucose, Bld  Date Value Ref Range Status  03/23/2020 95 70 - 99 mg/dL Final    Comment:    Glucose reference range applies only to samples taken after fasting for at least 8 hours.  06/16/2019 86 70 - 99 mg/dL Final    Comment:    Glucose reference range applies only to samples taken after fasting for at least 8 hours. Performed at  [facility], [address] [place], [place] [address]   01/16/2018 89 65 - 99 mg/dL Final    Comment:    .            Fasting reference interval .     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   Influenza vaccination declined by patient 01/15/2017   Contusion of right knee 11/15/2016   Subluxation of shoulder joint 11/15/2016   Abnormal mammogram of both breasts 02/04/2016   History of pneumonia as indication for 23-polyvalent pneumococcal polysaccharide vaccine 01/14/2016   Preventative health care 01/14/2016   Facial pain, atypical 04/27/2015   Eustachian tube dysfunction 04/27/2015   Neoplasm of skin of neck 04/27/2015   Abnormal thyroid function test 02/15/2015   Rash 02/15/2015   Breast cancer screening 01/12/2015   Elevated blood-pressure reading without diagnosis of hypertension 01/12/2015   GAD (generalized anxiety disorder) 01/12/2015   OSA on CPAP 01/12/2015   Allergy    Asthma    Morbid obesity ([place])    Dysthymic disorder    GERD (gastroesophageal reflux disease)    Fibromyalgia 07/22/2014   Intractable chronic migraine without aura and without status migrainosus 07/22/2014   Morbid obesity with BMI of 50.0-59.9, adult ([place]) 07/22/2014    Past Surgical History:  Procedure Laterality Date   ABDOMINAL HYSTERECTOMY  2007   complete due to  endometriosis   CHOLECYSTECTOMY  01/2018   Laparoscopic sleeve gastrectomy  11/09/2017   NASAL SINUS SURGERY  1996 and 1999   x 2    Family History  Problem Relation Age of Onset   Fibromyalgia Mother    Diabetes Mother    Cancer Father        prostate   Diabetes Maternal Aunt    Diabetes Paternal Aunt    Scleroderma Maternal Grandmother    Heart disease Maternal Grandfather    Hypertension Neg Hx    Hyperlipidemia Neg Hx    Stroke Neg Hx    COPD Neg Hx     Social History   Socioeconomic History   Marital status: Married    Spouse name: Dominica Severin  Number of children: 3   Years of education: 14   Highest education level: Associate degree: occupational, Hotel manager, or vocational program  Occupational History   Not on file  Tobacco Use   Smoking status: Former    Packs/day: 1.00    Years: 20.00    Pack years: 20.00    Types: Cigarettes    Quit date: 04/04/2003    Years since quitting: 18.0   Smokeless tobacco: Never  Vaping Use   Vaping Use: Never used  Substance and Sexual Activity   Alcohol use: Yes    Comment: Very Rare   Drug use: No   Sexual activity: Yes    Birth control/protection: Surgical    Comment: Total hystertomy   Other Topics Concern   Not on file  Social History Narrative   Not on file   Social Determinants of Health   Financial Resource Strain: Not on file  Food Insecurity: Not on file  Transportation Needs: No Transportation Needs   Lack of Transportation (Medical): No   Lack of Transportation (Non-Medical): No  Physical Activity: Insufficiently Active   Days of Exercise per Week: 1 day   Minutes of Exercise per Session: 20 min  Stress: Not on file  Social Connections: Not on file  Intimate Partner Violence: Not At Risk   Fear of Current or Ex-Partner: No   Emotionally Abused: No   Physically Abused: No   Sexually Abused: No     Current Outpatient Medications:    albuterol (ACCUNEB) 0.63 MG/3ML nebulizer solution, USE 1 VIAL VIA  NEBULIZER EVERY 4 HOURS AS NEEDED FOR WHEEZING, Disp: 75 mL, Rfl: 3   albuterol (VENTOLIN HFA) 108 (90 Base) MCG/ACT inhaler, Inhale 2 puffs into the lungs every 6 (six) hours as needed for wheezing or shortness of breath., Disp: 18 g, Rfl: 2   beclomethasone (QVAR) 40 MCG/ACT inhaler, Inhale 2 puffs into the lungs 2 (two) times daily., Disp: 1 each, Rfl: 1   calcium-vitamin D 250-100 MG-UNIT tablet, Take 1 tablet by mouth 2 (two) times daily., Disp: , Rfl:    clonazePAM (KLONOPIN) 0.5 MG tablet, Take 1 tablet (0.5 mg total) by mouth 2 (two) times daily as needed., Disp: 15 tablet, Rfl: 0   Erenumab-aooe (AIMOVIG) 70 MG/ML SOAJ, Inject 70 mg into the skin every 28 (twenty-eight) days., Disp: , Rfl:    ibuprofen (ADVIL) 200 MG tablet, Take 1-2 tablets (200-400 mg total) by mouth every 6 (six) hours as needed., Disp: , Rfl:    ipratropium-albuterol (DUONEB) 0.5-2.5 (3) MG/3ML SOLN, Take 3 mLs by nebulization 3 (three) times daily as needed., Disp: 180 mL, Rfl: 1   Multiple Vitamins-Minerals (MULTIVITAMIN WITH MINERALS) tablet, Take 1 tablet by mouth daily., Disp: , Rfl:    ondansetron (ZOFRAN ODT) 8 MG disintegrating tablet, Take 1 tablet (8 mg total) by mouth every 8 (eight) hours as needed for nausea or vomiting., Disp: 20 tablet, Rfl: 0   SUMAtriptan (IMITREX) 50 MG tablet, Take 1-2 tablets (50-100 mg total) by mouth every 2 (two) hours as needed for migraine. May repeat in 2 hours if headache persists or recurs., Disp: 10 tablet, Rfl: 2   valACYclovir (VALTREX) 1000 MG tablet, TAKE 2 TABLETS BY MOUTH AT FIRST SIGN OF OUTBREAK FOLLOWED BY 2 TABLETS MORE 12 HOURS LATER, Disp: 4 tablet, Rfl: 5  Allergies  Allergen Reactions   Naproxen Hives   Codeine Nausea And Vomiting     ROS  Constitutional: Negative for fever or weight change.  Respiratory:  Negative for cough and shortness of breath.   Cardiovascular: Negative for chest pain or palpitations.  Gastrointestinal: Negative for abdominal pain,  no bowel changes.  Musculoskeletal: Negative for gait problem or joint swelling.  Skin: Negative for rash.  Neurological: Negative for dizziness or headache.  No other specific complaints in a complete review of systems (except as listed in HPI above).   Objective  Vitals:   04/01/21 0836  BP: 126/84  Pulse: 84  Resp: 16  Temp: 97.9 F (36.6 C)  SpO2: 95%  Weight: 287 lb 12.8 oz (130.5 kg)  Height: '5\' 5"'  (1.651 m)    Body mass index is 47.89 kg/m.  Physical Exam  Constitutional: Patient appears well-developed and well-nourished. No distress.  HENT: Head: Normocephalic and atraumatic. Ears: B TMs ok, no erythema or effusion; Nose: Nose normal. Mouth/Throat: not done Eyes: Conjunctivae and EOM are normal. Pupils are equal, round, and reactive to light. No scleral icterus.  Neck: Normal range of motion. Neck supple. No JVD present. No thyromegaly present.  Cardiovascular: Normal rate, regular rhythm and normal heart sounds.  No murmur heard. No BLE edema. Pulmonary/Chest: Effort normal and breath sounds normal. No respiratory distress. Abdominal: Soft. Bowel sounds are normal, no distension. There is no tenderness. no masses Breast: no lumps or masses, no nipple discharge or rashes FEMALE GENITALIA: not done RECTAL: not done Musculoskeletal: Normal range of motion, no joint effusions. No gross deformities Neurological: he is alert and oriented to person, place, and time. No cranial nerve deficit. Coordination, balance, strength, speech and gait are normal.  Skin: Skin is warm and dry. No rash noted. No erythema.  Psychiatric: Patient has a normal mood and affect. behavior is normal. Judgment and thought content normal.     Fall Risk: Fall Risk  04/01/2021 12/22/2020 03/15/2020 07/17/2019 04/18/2019  Falls in the past year? 0 0 1 0 0  Number falls in past yr: 0 0 1 0 0  Injury with Fall? 0 0 1 0 0  Risk for fall due to : - - History of fall(s) - -  Follow up - - Falls  evaluation completed - -    Functional Status Survey: Is the patient deaf or have difficulty hearing?: No Does the patient have difficulty seeing, even when wearing glasses/contacts?: No Does the patient have difficulty concentrating, remembering, or making decisions?: Yes Does the patient have difficulty walking or climbing stairs?: No Does the patient have difficulty dressing or bathing?: No Does the patient have difficulty doing errands alone such as visiting a doctor's office or shopping?: No   Assessment & Plan  1. Annual physical exam  - Ambulatory referral to Gastroenterology - Lipid panel - CBC with Differential/Platelet - COMPLETE METABOLIC PANEL WITH GFR - Hemoglobin A1c  2. Screening for colon cancer  - Ambulatory referral to Gastroenterology  3. Encounter for medication monitoring - CBC with Differential/Platelet - COMPLETE METABOLIC PANEL WITH GFR  4. Screening for diabetes mellitus  - COMPLETE METABOLIC PANEL WITH GFR - Hemoglobin A1c  5. Screening for cholesterol level  - Lipid panel - COMPLETE METABOLIC PANEL WITH GFR  6. Screening for deficiency anemia  - CBC with Differential/Platelet  7. Morbid obesity (HCC)  Lipid panel - CBC with Differential/Platelet - COMPLETE METABOLIC PANEL WITH GFR - Hemoglobin A1c  -USPSTF grade A and B recommendations reviewed with patient; age-appropriate recommendations, preventive care, screening tests, etc discussed and encouraged; healthy living encouraged; see AVS for patient education given to patient -Discussed importance of 150  minutes of physical activity weekly, eat two servings of fish weekly, eat one serving of tree nuts ( cashews, pistachios, pecans, almonds.Marland Kitchen) every other day, eat 6 servings of fruit/vegetables daily and drink plenty of water and avoid sweet beverages.

## 2021-04-05 ENCOUNTER — Other Ambulatory Visit: Payer: Self-pay

## 2021-04-05 DIAGNOSIS — Z1211 Encounter for screening for malignant neoplasm of colon: Secondary | ICD-10-CM

## 2021-04-05 MED ORDER — SUTAB 1479-225-188 MG PO TABS: 12.0000 | ORAL_TABLET | Freq: Once | ORAL | 0 refills | Status: AC

## 2021-04-05 NOTE — Progress Notes (Signed)
Gastroenterology Pre-Procedure Review  Request Date: 05/30/2021 Requesting Physician: Dr. Vicente Males  PATIENT REVIEW QUESTIONS: The patient responded to the following health history questions as indicated:    1. Are you having any GI issues? no 2. Do you have a personal history of Polyps? no 3. Do you have a family history of Colon Cancer or Polyps? no 4. Diabetes Mellitus? no 5. Joint replacements in the past 12 months?no 6. Major health problems in the past 3 months?no 7. Any artificial heart valves, MVP, or defibrillator?no    MEDICATIONS & ALLERGIES:    Patient reports the following regarding taking any anticoagulation/antiplatelet therapy:   Plavix, Coumadin, Eliquis, Xarelto, Lovenox, Pradaxa, Brilinta, or Effient? no Aspirin? no  Patient confirms/reports the following medications:  Current Outpatient Medications  Medication Sig Dispense Refill   albuterol (ACCUNEB) 0.63 MG/3ML nebulizer solution USE 1 VIAL VIA NEBULIZER EVERY 4 HOURS AS NEEDED FOR WHEEZING 75 mL 3   albuterol (VENTOLIN HFA) 108 (90 Base) MCG/ACT inhaler Inhale 2 puffs into the lungs every 6 (six) hours as needed for wheezing or shortness of breath. 18 g 2   beclomethasone (QVAR) 40 MCG/ACT inhaler Inhale 2 puffs into the lungs 2 (two) times daily. 1 each 1   calcium-vitamin D 250-100 MG-UNIT tablet Take 1 tablet by mouth 2 (two) times daily.     clonazePAM (KLONOPIN) 0.5 MG tablet Take 1 tablet (0.5 mg total) by mouth 2 (two) times daily as needed. 15 tablet 0   Erenumab-aooe (AIMOVIG) 70 MG/ML SOAJ Inject 70 mg into the skin every 28 (twenty-eight) days.     ibuprofen (ADVIL) 200 MG tablet Take 1-2 tablets (200-400 mg total) by mouth every 6 (six) hours as needed.     ipratropium-albuterol (DUONEB) 0.5-2.5 (3) MG/3ML SOLN Take 3 mLs by nebulization 3 (three) times daily as needed. 180 mL 1   Multiple Vitamins-Minerals (MULTIVITAMIN WITH MINERALS) tablet Take 1 tablet by mouth daily.     ondansetron (ZOFRAN ODT) 8 MG  disintegrating tablet Take 1 tablet (8 mg total) by mouth every 8 (eight) hours as needed for nausea or vomiting. 20 tablet 0   SUMAtriptan (IMITREX) 50 MG tablet Take 1-2 tablets (50-100 mg total) by mouth every 2 (two) hours as needed for migraine. May repeat in 2 hours if headache persists or recurs. 10 tablet 2   valACYclovir (VALTREX) 1000 MG tablet TAKE 2 TABLETS BY MOUTH AT FIRST SIGN OF OUTBREAK FOLLOWED BY 2 TABLETS MORE 12 HOURS LATER 4 tablet 5   No current facility-administered medications for this visit.    Patient confirms/reports the following allergies:  Allergies  Allergen Reactions   Naproxen Hives   Codeine Nausea And Vomiting    No orders of the defined types were placed in this encounter.   AUTHORIZATION INFORMATION Primary Insurance: 1D#: Group #:  Secondary Insurance: 1D#: Group #:  SCHEDULE INFORMATION: Date: 05/30/2021 Time: Location:[facility]

## 2021-04-08 DIAGNOSIS — Z20822 Contact with and (suspected) exposure to covid-19: Secondary | ICD-10-CM | POA: Diagnosis not present

## 2021-04-14 DIAGNOSIS — Z48815 Encounter for surgical aftercare following surgery on the digestive system: Secondary | ICD-10-CM | POA: Diagnosis not present

## 2021-04-14 DIAGNOSIS — K912 Postsurgical malabsorption, not elsewhere classified: Secondary | ICD-10-CM | POA: Diagnosis not present

## 2021-04-14 DIAGNOSIS — Z9884 Bariatric surgery status: Secondary | ICD-10-CM | POA: Diagnosis not present

## 2021-04-14 DIAGNOSIS — Z713 Dietary counseling and surveillance: Secondary | ICD-10-CM | POA: Diagnosis not present

## 2021-04-15 DIAGNOSIS — Z20822 Contact with and (suspected) exposure to covid-19: Secondary | ICD-10-CM | POA: Diagnosis not present

## 2021-04-21 DIAGNOSIS — G4733 Obstructive sleep apnea (adult) (pediatric): Secondary | ICD-10-CM | POA: Diagnosis not present

## 2021-05-22 DIAGNOSIS — G4733 Obstructive sleep apnea (adult) (pediatric): Secondary | ICD-10-CM | POA: Diagnosis not present

## 2021-05-24 ENCOUNTER — Other Ambulatory Visit: Payer: Self-pay | Admitting: Family Medicine

## 2021-05-27 DIAGNOSIS — G629 Polyneuropathy, unspecified: Secondary | ICD-10-CM | POA: Diagnosis not present

## 2021-05-27 DIAGNOSIS — G43719 Chronic migraine without aura, intractable, without status migrainosus: Secondary | ICD-10-CM | POA: Diagnosis not present

## 2021-05-27 DIAGNOSIS — M5481 Occipital neuralgia: Secondary | ICD-10-CM | POA: Diagnosis not present

## 2021-05-27 DIAGNOSIS — G243 Spasmodic torticollis: Secondary | ICD-10-CM | POA: Diagnosis not present

## 2021-05-30 ENCOUNTER — Ambulatory Visit
Admission: RE | Admit: 2021-05-30 | Discharge: 2021-05-30 | Disposition: A | Discharge status: Home / Self Care | Payer: BC Managed Care – PPO | Source: Ambulatory Visit | Attending: Gastroenterology | Admitting: Gastroenterology

## 2021-05-30 ENCOUNTER — Ambulatory Visit: Payer: BC Managed Care – PPO | Admitting: Certified Registered Nurse Anesthetist

## 2021-05-30 ENCOUNTER — Encounter: Payer: Self-pay | Admitting: Gastroenterology

## 2021-05-30 ENCOUNTER — Encounter
Admission: RE | Disposition: A | Discharge status: Home / Self Care | Payer: Self-pay | Source: Ambulatory Visit | Attending: Gastroenterology

## 2021-05-30 ENCOUNTER — Other Ambulatory Visit: Payer: Self-pay

## 2021-05-30 DIAGNOSIS — M1990 Unspecified osteoarthritis, unspecified site: Secondary | ICD-10-CM | POA: Diagnosis not present

## 2021-05-30 DIAGNOSIS — D122 Benign neoplasm of ascending colon: Secondary | ICD-10-CM | POA: Diagnosis not present

## 2021-05-30 DIAGNOSIS — R519 Headache, unspecified: Secondary | ICD-10-CM | POA: Insufficient documentation

## 2021-05-30 DIAGNOSIS — G709 Myoneural disorder, unspecified: Secondary | ICD-10-CM | POA: Insufficient documentation

## 2021-05-30 DIAGNOSIS — G4730 Sleep apnea, unspecified: Secondary | ICD-10-CM | POA: Diagnosis not present

## 2021-05-30 DIAGNOSIS — K635 Polyp of colon: Secondary | ICD-10-CM | POA: Diagnosis not present

## 2021-05-30 DIAGNOSIS — Z1211 Encounter for screening for malignant neoplasm of colon: Secondary | ICD-10-CM | POA: Insufficient documentation

## 2021-05-30 DIAGNOSIS — Z87891 Personal history of nicotine dependence: Secondary | ICD-10-CM | POA: Insufficient documentation

## 2021-05-30 DIAGNOSIS — J45909 Unspecified asthma, uncomplicated: Secondary | ICD-10-CM | POA: Insufficient documentation

## 2021-05-30 DIAGNOSIS — G4733 Obstructive sleep apnea (adult) (pediatric): Secondary | ICD-10-CM | POA: Diagnosis not present

## 2021-05-30 DIAGNOSIS — K219 Gastro-esophageal reflux disease without esophagitis: Secondary | ICD-10-CM | POA: Insufficient documentation

## 2021-05-30 HISTORY — PX: COLONOSCOPY WITH PROPOFOL: SHX5780

## 2021-05-30 SURGERY — COLONOSCOPY WITH PROPOFOL
Anesthesia: General

## 2021-05-30 MED ORDER — GLYCOPYRROLATE 0.2 MG/ML IJ SOLN
INTRAMUSCULAR | Status: AC
  Filled 2021-05-30: qty 1

## 2021-05-30 MED ORDER — PROPOFOL 10 MG/ML IV BOLUS
INTRAVENOUS | Status: DC | PRN
  Administered 2021-05-30: 70 mg via INTRAVENOUS

## 2021-05-30 MED ORDER — PROPOFOL 500 MG/50ML IV EMUL
INTRAVENOUS | Status: DC | PRN
Start: 2021-05-30 — End: 2021-05-30
  Administered 2021-05-30: 160 ug/kg/min via INTRAVENOUS

## 2021-05-30 MED ORDER — PROPOFOL 500 MG/50ML IV EMUL
INTRAVENOUS | Status: AC
  Filled 2021-05-30: qty 250

## 2021-05-30 MED ORDER — SODIUM CHLORIDE 0.9 % IV SOLN: INTRAVENOUS | Status: DC

## 2021-05-30 MED ORDER — PHENYLEPHRINE HCL-NACL 20-0.9 MG/250ML-% IV SOLN
INTRAVENOUS | Status: AC
  Filled 2021-05-30: qty 250

## 2021-05-30 NOTE — H&P (Signed)
Jamie Bellows, MD [address], [place], [place], [place], [address] [id number], [place], [place], [place], [address] Phone: [phone]  Fax: [phone]  Primary Care Physician:  Delsa Grana, PA-C   Pre-Procedure History & Physical: HPI:  Jamie Hardin is a 51 y.o. female is here for an colonoscopy.   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 Authorizing Provider  albuterol (ACCUNEB) 0.63 MG/3ML nebulizer solution USE 1 VIAL VIA NEBULIZER EVERY 4 HOURS AS NEEDED FOR WHEEZING 09/16/18  Yes Laverle Hobby, MD  albuterol (VENTOLIN HFA) 108 (90 Base) MCG/ACT inhaler Inhale 2 puffs into the lungs every 6 (six) hours as needed for wheezing or shortness of breath. 03/15/20  Yes Delsa Grana, PA-C  beclomethasone (QVAR) 40 MCG/ACT inhaler Inhale 2 puffs into the lungs 2 (two) times daily. 03/15/20  Yes Delsa Grana, PA-C  calcium-vitamin D 250-100 MG-UNIT tablet Take 1 tablet by mouth 2 (two) times daily.   Yes [provider]  clonazePAM (KLONOPIN) 0.5 MG tablet Take 1 tablet (0.5 mg total) by mouth 2 (two) times daily as needed. 12/22/20  Yes Myles Gip, DO  gabapentin (NEURONTIN) 300 MG capsule Take 300 mg by mouth 3 (three) times daily.   Yes [provider]  ibuprofen (ADVIL) 200 MG tablet Take 1-2 tablets (200-400 mg total) by mouth every 6 (six)  hours as needed. 07/25/18  Yes Lada, Satira Anis, MD  ipratropium-albuterol (DUONEB) 0.5-2.5 (3) MG/3ML SOLN Take 3 mLs by nebulization 3 (three) times daily as needed. 03/15/20  Yes Delsa Grana, PA-C  Multiple Vitamins-Minerals (MULTIVITAMIN WITH MINERALS) tablet Take 1 tablet by mouth daily.   Yes [provider]  ondansetron (ZOFRAN ODT) 8 MG disintegrating tablet Take 1 tablet (8 mg total) by mouth every 8 (eight) hours as needed for nausea or vomiting. 03/01/20  Yes Margarette Canada, NP  pantoprazole (PROTONIX) 40 MG tablet Take 40 mg by mouth daily.   Yes [provider]  SUMAtriptan (IMITREX) 50 MG tablet Take 1-2 tablets (50-100 mg total) by mouth every 2 (two) hours as needed for migraine. May repeat in 2 hours if headache persists or recurs. 07/17/19  Yes Delsa Grana, PA-C  tiZANidine (ZANAFLEX) 4 MG tablet Take 4 mg by mouth every 6 (six) hours as needed for muscle spasms.   Yes [provider]  valACYclovir (VALTREX) 1000 MG tablet TAKE 2 TABLETS BY MOUTH AT FIRST SIGN OF OUTBREAK FOLLOWED BY 2 TABLETS MORE 12 HOURS LATER 05/24/21  Yes Bo Merino, FNP  Erenumab-aooe (AIMOVIG) 70 MG/ML SOAJ Inject 70 mg into the skin every 28 (twenty-eight) days. 11/26/19   [provider]    Allergies as of 04/06/2021 - Review Complete 04/05/2021  Allergen Reaction Noted  Naproxen Hives 11/23/2014   Codeine Nausea And Vomiting 11/23/2014    Family History  Problem Relation Age of Onset   Fibromyalgia Mother    Diabetes Mother    Cancer Father        prostate   Diabetes Maternal Aunt    Diabetes Paternal Aunt    Scleroderma Maternal Grandmother    Heart disease Maternal Grandfather    Hypertension Neg Hx    Hyperlipidemia Neg Hx    Stroke Neg Hx    COPD Neg Hx     Social History   Socioeconomic History   Marital status: Married    Spouse name: Dominica Severin   Number of children: 3   Years of education: 14   Highest education level: Associate degree:  occupational, Hotel manager, or vocational program  Occupational History   Not on file  Tobacco Use   Smoking status: Former    Packs/day: 1.00    Years: 20.00    Pack years: 20.00    Types: Cigarettes    Quit date: 04/04/2003    Years since quitting: 18.1   Smokeless tobacco: Never  Vaping Use   Vaping Use: Never used  Substance and Sexual Activity   Alcohol use: Yes    Comment: Very Rare   Drug use: No   Sexual activity: Yes    Birth control/protection: Surgical    Comment: Total hystertomy   Other Topics Concern   Not on file  Social History Narrative   Not on file   Social Determinants of Health   Financial Resource Strain: Low Risk    Difficulty of Paying Living Expenses: Not hard at all  Food Insecurity: No Food Insecurity   Worried About Charity fundraiser in the Last Year: Never true   Ran Out of Food in the Last Year: Never true  Transportation Needs: No Transportation Needs   Lack of Transportation (Medical): No   Lack of Transportation (Non-Medical): No  Physical Activity: Insufficiently Active   Days of Exercise per Week: 1 day   Minutes of Exercise per Session: 20 min  Stress: Stress Concern Present   Feeling of Stress : Rather much  Social Connections: Socially Integrated   Frequency of Communication with Friends and Family: More than three times a week   Frequency of Social Gatherings with Friends and Family: More than three times a week   Attends Religious Services: More than 4 times per year   Active Member of [employer] or Organizations: Yes   Attends Music therapist: More than 4 times per year   Marital Status: Married  Human resources officer Violence: Not At Risk   Fear of Current or Ex-Partner: No   Emotionally Abused: No   Physically Abused: No   Sexually Abused: No    Review of Systems: See HPI, otherwise negative ROS  Physical Exam: BP 131/78    Pulse 77    Temp (!) 97.2 F (36.2 C) (Temporal)    Ht 5\' 5"  (1.651 m)    Wt 131.5 kg     SpO2 98%    BMI 48.26 kg/m  General:   Alert,  pleasant and cooperative in NAD Head:  Normocephalic and atraumatic. Neck:  Supple; no masses or thyromegaly. Lungs:  Clear throughout to auscultation, normal respiratory effort.    Heart:  +S1, +S2, Regular rate and rhythm, No edema. Abdomen:  Soft, nontender and nondistended. Normal bowel sounds, without guarding, and without rebound.   Neurologic:  Alert and  oriented x4;  grossly normal neurologically.  Impression/Plan: Jamie Hardin is here for an colonoscopy to be performed for Screening colonoscopy average risk   Risks, benefits, limitations, and alternatives regarding  colonoscopy have been reviewed with the patient.  Questions have been answered.  All parties agreeable.   Jamie Bellows, MD  05/30/2021, 8:13 AM

## 2021-05-30 NOTE — Anesthesia Procedure Notes (Signed)
Date/Time: 05/30/2021 8:20 AM Performed by: Demetrius Charity, CRNA Pre-anesthesia Checklist: Patient identified, Patient being monitored, Timeout performed, Emergency Drugs available and Suction available Patient Re-evaluated:Patient Re-evaluated prior to induction Oxygen Delivery Method: Nasal cannula Dental Injury: Teeth and Oropharynx as per pre-operative assessment

## 2021-05-30 NOTE — Op Note (Signed)
[facility] Gastroenterology Patient Name: Jamie Hardin Procedure Date: 05/30/2021 7:30 AM MRN: [id number] Account #: [id number] Date of Birth: 08-24-1970 Admit Type: Outpatient Age: 51 Room: [facility] ENDO ROOM 4 Gender: Female Note Status: Finalized Instrument Name: Peds Colonoscope [id number] Procedure:             Colonoscopy Indications:           Screening for colorectal malignant neoplasm Providers:             [name] MD, MD Referring MD:          Delsa Grana (Referring MD) Medicines:             Monitored Anesthesia Care Complications:         No immediate complications. Procedure:             Pre-Anesthesia Assessment:                        - Prior to the procedure, a History and Physical was                         performed, and patient medications, allergies and                         sensitivities were reviewed. The patient's tolerance                         of previous anesthesia was reviewed.                        - The risks and benefits of the procedure and the                         sedation options and risks were discussed with the                         patient. All questions were answered and informed                         consent was obtained.                        - ASA Grade Assessment: II - A patient with mild                         systemic disease.                        After obtaining informed consent, the colonoscope was                         passed under direct vision. Throughout the procedure,                         the patient's blood pressure, pulse, and oxygen                         saturations were monitored continuously. The                         Colonoscope was introduced  through the anus and                         advanced to the the cecum, identified by the                         appendiceal orifice. The colonoscopy was performed                         with ease. The patient tolerated the procedure well.                          The quality of the bowel preparation was poor. Findings:      The perianal and digital rectal examinations were normal.      A 8 mm polyp was found in the ascending colon. The polyp was sessile.       The polyp was removed with a cold snare. Resection and retrieval were       complete.      A large amount of copious quantities of liquid stool was found in the       sigmoid colon and in the descending colon, interfering with       visualization.      The exam was otherwise without abnormality. Impression:            - Preparation of the colon was poor.                        - One 8 mm polyp in the ascending colon, removed with                         a cold snare. Resected and retrieved.                        - Stool in the sigmoid colon and in the descending                         colon.                        - The examination was otherwise normal. Recommendation:        - Discharge patient to home (with escort).                        - Resume previous diet.                        - Continue present medications.                        - Await pathology results.                        - Repeat colonoscopy in 1 year because the bowel                         preparation was suboptimal. Procedure Code(s):     --- Professional ---                        [phone], Colonoscopy, flexible;  with removal of                         tumor(s), polyp(s), or other lesion(s) by snare                         technique Diagnosis Code(s):     --- Professional ---                        Z12.11, Encounter for screening for malignant neoplasm                         of colon                        K63.5, Polyp of colon CPT copyright 2019 American Medical Association. All rights reserved. The codes documented in this report are preliminary and upon coder review may  be revised to meet current compliance requirements. [name], MD [name] MD, MD 05/30/2021 8:49:36 AM This report  has been signed electronically. Number of Addenda: 0 Note Initiated On: 05/30/2021 7:30 AM Scope Withdrawal Time: 0 hours 22 minutes 6 seconds  Total Procedure Duration: 0 hours 25 minutes 42 seconds  Estimated Blood Loss:  Estimated blood loss: none.      [facility]

## 2021-05-30 NOTE — Anesthesia Preprocedure Evaluation (Signed)
Anesthesia Evaluation  Patient identified by MRN, date of birth, ID band Patient awake    Reviewed: Allergy & Precautions, NPO status , Patient's Chart, lab work & pertinent test results  History of Anesthesia Complications (+) PONV, Emergence Delirium and history of anesthetic complications  Airway Mallampati: III  TM Distance: <3 FB Neck ROM: full    Dental  (+) Chipped   Pulmonary neg shortness of breath, asthma , sleep apnea , former smoker,    Pulmonary exam normal        Cardiovascular Exercise Tolerance: Good (-) Past MI Normal cardiovascular exam     Neuro/Psych  Headaches, PSYCHIATRIC DISORDERS  Neuromuscular disease    GI/Hepatic Neg liver ROS, GERD  Controlled,  Endo/Other  negative endocrine ROS  Renal/GU negative Renal ROS  negative genitourinary   Musculoskeletal  (+) Arthritis ,   Abdominal   Peds  Hematology negative hematology ROS (+)   Anesthesia Other Findings Past Medical History: No date: Allergy No date: Asthma No date: Chronic sinusitis No date: Dysthymic disorder No date: GERD (gastroesophageal reflux disease) 05/04/2017: Malrotation of cecum     Comment:  Scans Jan 2019 No date: Obesity No date: Ovarian endometriosis No date: Peripheral neuropathy 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 No date: Torn meniscus  Past Surgical History: 2007: ABDOMINAL HYSTERECTOMY     Comment:  complete due to endometriosis 01/2018: CHOLECYSTECTOMY 11/09/2017: Laparoscopic sleeve gastrectomy 1996 and 1999: NASAL SINUS SURGERY     Comment:  x 2  BMI    Body Mass Index: 48.26 kg/m      Reproductive/Obstetrics negative OB ROS                             Anesthesia Physical Anesthesia Plan  ASA: 3  Anesthesia Plan: General   Post-op Pain Management:    Induction: Intravenous  PONV Risk Score and Plan: Propofol infusion and  TIVA  Airway Management Planned: Natural Airway and Nasal Cannula  Additional Equipment:   Intra-op Plan:   Post-operative Plan:   Informed Consent: I have reviewed the patients History and Physical, chart, labs and discussed the procedure including the risks, benefits and alternatives for the proposed anesthesia with the patient or authorized representative who has indicated his/her understanding and acceptance.     Dental Advisory Given  Plan Discussed with: Anesthesiologist, CRNA and Surgeon  Anesthesia Plan Comments: (Patient consented for risks of anesthesia including but not limited to:  - adverse reactions to medications - risk of airway placement if required - damage to eyes, teeth, lips or other oral mucosa - nerve damage due to positioning  - sore throat or hoarseness - Damage to heart, brain, nerves, lungs, other parts of body or loss of life  Patient voiced understanding.)        Anesthesia Quick Evaluation

## 2021-05-30 NOTE — Transfer of Care (Signed)
Immediate Anesthesia Transfer of Care Note  Patient: Jamie Hardin  Procedure(s) Performed: COLONOSCOPY WITH PROPOFOL  Patient Location: PACU  Anesthesia Type:General  Level of Consciousness: awake and alert   Airway & Oxygen Therapy: Patient Spontanous Breathing  Post-op Assessment: Report given to RN and Post -op Vital signs reviewed and stable  Post vital signs: Reviewed and stable  Last Vitals:  Vitals Value Taken Time  BP 109/69 05/30/21 0850  Temp 36.2 C 05/30/21 0850  Pulse 93 05/30/21 0850  Resp 20 05/30/21 0850  SpO2 98 % 05/30/21 0850    Last Pain:  Vitals:   05/30/21 0850  TempSrc: Temporal  PainSc:          Complications: No notable events documented.

## 2021-05-30 NOTE — Anesthesia Postprocedure Evaluation (Signed)
Anesthesia Post Note  Patient: Jamie Hardin  Procedure(s) Performed: COLONOSCOPY WITH PROPOFOL  Patient location during evaluation: Endoscopy Anesthesia Type: General Level of consciousness: awake and alert Pain management: pain level controlled Vital Signs Assessment: post-procedure vital signs reviewed and stable Respiratory status: spontaneous breathing, nonlabored ventilation, respiratory function stable and patient connected to nasal cannula oxygen Cardiovascular status: blood pressure returned to baseline and stable Postop Assessment: no apparent nausea or vomiting Anesthetic complications: no   No notable events documented.   Last Vitals:  Vitals:   05/30/21 0900 05/30/21 0910  BP: 111/70 115/79  Pulse: 76 73  Resp: 17 16  Temp:    SpO2: 100% 100%    Last Pain:  Vitals:   05/30/21 0850  TempSrc: Temporal  PainSc:                  Precious Haws Pauline Trainer

## 2021-05-31 ENCOUNTER — Encounter: Payer: Self-pay | Admitting: Gastroenterology

## 2021-05-31 LAB — SURGICAL PATHOLOGY

## 2021-06-01 ENCOUNTER — Encounter: Payer: Self-pay | Admitting: Gastroenterology

## 2021-06-13 DIAGNOSIS — Z713 Dietary counseling and surveillance: Secondary | ICD-10-CM | POA: Diagnosis not present

## 2021-06-13 DIAGNOSIS — Z9884 Bariatric surgery status: Secondary | ICD-10-CM | POA: Diagnosis not present

## 2021-06-13 DIAGNOSIS — K912 Postsurgical malabsorption, not elsewhere classified: Secondary | ICD-10-CM | POA: Diagnosis not present

## 2021-06-13 DIAGNOSIS — Z48815 Encounter for surgical aftercare following surgery on the digestive system: Secondary | ICD-10-CM | POA: Diagnosis not present

## 2021-06-19 DIAGNOSIS — G4733 Obstructive sleep apnea (adult) (pediatric): Secondary | ICD-10-CM | POA: Diagnosis not present

## 2021-07-20 DIAGNOSIS — G4733 Obstructive sleep apnea (adult) (pediatric): Secondary | ICD-10-CM | POA: Diagnosis not present

## 2021-07-26 ENCOUNTER — Ambulatory Visit
Admission: EM | Admit: 2021-07-26 | Discharge: 2021-07-26 | Disposition: A | Discharge status: Home / Self Care | Payer: BC Managed Care – PPO | Attending: Internal Medicine | Admitting: Internal Medicine

## 2021-07-26 DIAGNOSIS — B349 Viral infection, unspecified: Secondary | ICD-10-CM | POA: Insufficient documentation

## 2021-07-26 LAB — GROUP A STREP BY PCR: Group A Strep by PCR: NOT DETECTED

## 2021-07-26 MED ORDER — FLUCONAZOLE 150 MG PO TABS: 150.0000 mg | ORAL_TABLET | Freq: Once | ORAL | 0 refills | Status: AC

## 2021-07-26 MED ORDER — PREDNISONE 50 MG PO TABS: 50.0000 mg | ORAL_TABLET | Freq: Every day | ORAL | 0 refills | Status: DC

## 2021-07-26 MED ORDER — AMOXICILLIN-POT CLAVULANATE 875-125 MG PO TABS: 1.0000 | ORAL_TABLET | Freq: Two times a day (BID) | ORAL | 0 refills | Status: AC

## 2021-07-26 NOTE — ED Triage Notes (Signed)
Pt was with daughter last week who was sick with Bacterial Tonsilitis. Pt was tested for Strep and Mono and it was negative.  ? ?Pt states that she is now sick with the same symptoms.   ? ?Pt c/o Nasal congestion, Sore throat x5days. ?

## 2021-07-26 NOTE — Discharge Instructions (Signed)
Symptoms and exam today are consistent with a viral respiratory infection.  Strep test was negative.  Prescriptions for prednisone (for congestion), amoxicillin-clavulanate (antibiotic, in case symptoms persist for 10 days), and fluconazole (for possible yeast infection).  Note for work today.  Push fluids and rest.  Take tylenol or advil otc as needed for fever, discomfort.  Eat fruits and vegetables to help your immune system do its best work.  Anticipate gradual improvement over the next several days.  Recheck for new fever >100.5, increasing phlegm production/nasal discharge, or if not starting to improve in a few days.    ?

## 2021-07-26 NOTE — ED Provider Notes (Signed)
?[facility] ? ? ? ?CSN: [id number] ?Arrival date & time: 07/26/21  3500 ? ? ?  ? ?History   ?Chief Complaint ?Chief Complaint  ?Patient presents with  ? Sore Throat  ? Nasal Congestion  ? ? ?HPI ?Jamie Hardin is a 51 y.o. female.  presents today with 4 to 5 days history of sinus congestion, headache.  Right ear feels full.  Little bit of runny nose, little bit of cough particularly in the mornings or when she first lies down at night.  No fever.  No nausea/vomiting, no diarrhea. ? ?Has a bad sore throat, throat feels full/tight, started hurting about 3 days ago.  Throat is bothering her more than the other symptoms, says she feels sinus and congested a lot. ? ?Daughter had similar symptoms several days ago. ? ? ?Sore Throat ? ? ?Past Medical History:  ?Diagnosis Date  ? Allergy   ? Asthma   ? Chronic sinusitis   ? Dysthymic disorder   ? GERD (gastroesophageal reflux disease)   ? 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  ? Scans Jan 2019  ? Obesity   ? Ovarian endometriosis   ? Peripheral neuropathy   ? Situs inversus   ? of the intestines and appendix  ? Torn meniscus   ? ? ?Patient Active Problem List  ? Diagnosis Date Noted  ? Cervical dystonia 11/24/2020  ? Primary insomnia 11/27/2019  ? Peripheral neuropathy, idiopathic 11/26/2019  ? Bilateral occipital neuralgia 11/26/2019  ? Myofascial pain syndrome 11/26/2019  ? Hot flashes 07/25/2018  ? Onychomycosis 07/25/2018  ? 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 Newly recognized heart murmur 08/29/2017  ? PAC (premature atrial contraction) 08/29/2017  ? DDD (degenerative disc disease), cervical 07/11/2017  ? Pain in joint involving ankle and foot 07/11/2017  ? Low back pain 07/11/2017  ? 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  ? Influenza vaccination declined by patient 01/15/2017  ? Contusion of right knee 11/15/2016  ? Subluxation of shoulder joint 11/15/2016  ? Abnormal mammogram  of both breasts 02/04/2016  ? History of pneumonia as indication for 23-polyvalent pneumococcal polysaccharide vaccine 01/14/2016  ? Preventative health care 01/14/2016  ? Facial pain, atypical 04/27/2015  ? Eustachian tube dysfunction 04/27/2015  ? Neoplasm of skin of neck 04/27/2015  ? Abnormal thyroid function test 02/15/2015  ? Rash 02/15/2015  ? Breast cancer screening 01/12/2015  ? Elevated blood-pressure reading without diagnosis of hypertension 01/12/2015  ? GAD (generalized anxiety disorder) 01/12/2015  ? OSA on CPAP 01/12/2015  ? Allergy   ? Asthma   ? Morbid obesity ([place])   ? Dysthymic disorder   ? GERD (gastroesophageal reflux disease)   ? Fibromyalgia 07/22/2014  ? Intractable chronic migraine without aura and without status migrainosus 07/22/2014  ? Morbid obesity with BMI of 50.0-59.9, adult ([place]) 07/22/2014  ? ? ?Past Surgical History:  ?Procedure Laterality Date  ? ABDOMINAL HYSTERECTOMY  2007  ? complete due to endometriosis  ? CHOLECYSTECTOMY  01/2018  ? COLONOSCOPY WITH PROPOFOL N/A 05/30/2021  ? Procedure: COLONOSCOPY WITH PROPOFOL;  Surgeon: [name], MD;  Location: [facility] ENDOSCOPY;  Service: Gastroenterology;  Laterality: N/A;  ? Laparoscopic sleeve gastrectomy  11/09/2017  ? NASAL SINUS SURGERY  1996 and 1999  ? x 2  ? ? ?Home Medications   ? ?Prior to Admission medications   ?Medication Sig Start Date End Date Taking? Authorizing Provider  ?albuterol (ACCUNEB) 0.63 MG/3ML nebulizer solution USE 1  VIAL VIA NEBULIZER EVERY 4 HOURS AS NEEDED FOR WHEEZING 09/16/18  Yes Laverle Hobby, MD  ?albuterol (VENTOLIN HFA) 108 (90 Base) MCG/ACT inhaler Inhale 2 puffs into the lungs every 6 (six) hours as needed for wheezing or shortness of breath. 03/15/20  Yes Delsa Grana, PA-C  ?amoxicillin-clavulanate (AUGMENTIN) 875-125 MG tablet Take 1 tablet by mouth 2 (two) times daily for 10 days. Start if sinus symptoms persist for 10 days 07/26/21 08/05/21 Yes Wynona Luna, MD  ?beclomethasone  (QVAR) 40 MCG/ACT inhaler Inhale 2 puffs into the lungs 2 (two) times daily. 03/15/20  Yes Delsa Grana, PA-C  ?calcium-vitamin D 250-100 MG-UNIT tablet Take 1 tablet by mouth 2 (two) times daily.   Yes [provider]  ?clonazePAM (KLONOPIN) 0.5 MG tablet Take 1 tablet (0.5 mg total) by mouth 2 (two) times daily as needed. 12/22/20  Yes Myles Gip, DO  ?Erenumab-aooe (AIMOVIG) 70 MG/ML SOAJ Inject 70 mg into the skin every 28 (twenty-eight) days. 11/26/19  Yes [provider]  ?gabapentin (NEURONTIN) 300 MG capsule Take 300 mg by mouth 3 (three) times daily.   Yes [provider]  ?ibuprofen (ADVIL) 200 MG tablet Take 1-2 tablets (200-400 mg total) by mouth every 6 (six) hours as needed. 07/25/18  Yes Lada, Satira Anis, MD  ?ipratropium-albuterol (DUONEB) 0.5-2.5 (3) MG/3ML SOLN Take 3 mLs by nebulization 3 (three) times daily as needed. 03/15/20  Yes Delsa Grana, PA-C  ?Multiple Vitamins-Minerals (MULTIVITAMIN WITH MINERALS) tablet Take 1 tablet by mouth daily.   Yes [provider]  ?ondansetron (ZOFRAN ODT) 8 MG disintegrating tablet Take 1 tablet (8 mg total) by mouth every 8 (eight) hours as needed for nausea or vomiting. 03/01/20  Yes Margarette Canada, NP  ?pantoprazole (PROTONIX) 40 MG tablet Take 40 mg by mouth daily.   Yes [provider]  ?predniSONE (DELTASONE) 50 MG tablet Take 1 tablet (50 mg total) by mouth daily. Take for congestion now 07/26/21  Yes Valere Dross Jadene Pierini, MD  ?SUMAtriptan (IMITREX) 50 MG tablet Take 1-2 tablets (50-100 mg total) by mouth every 2 (two) hours as needed for migraine. May repeat in 2 hours if headache persists or recurs. 07/17/19  Yes Delsa Grana, PA-C  ?tiZANidine (ZANAFLEX) 4 MG tablet Take 4 mg by mouth every 6 (six) hours as needed for muscle spasms.   Yes [provider]  ?valACYclovir (VALTREX) 1000 MG tablet TAKE 2 TABLETS BY MOUTH AT FIRST SIGN OF OUTBREAK FOLLOWED BY 2 TABLETS MORE 12 HOURS LATER 05/24/21  Yes  Bo Merino, FNP  ? ? ?Family History ?Family History  ?Problem Relation Age of Onset  ? Fibromyalgia Mother   ? Diabetes Mother   ? Cancer Father   ?     prostate  ? Diabetes Maternal Aunt   ? Diabetes Paternal Aunt   ? Scleroderma Maternal Grandmother   ? Heart disease Maternal Grandfather   ? Hypertension Neg Hx   ? Hyperlipidemia Neg Hx   ? Stroke Neg Hx   ? COPD Neg Hx   ? ? ?Social History ?Social History  ? ?Tobacco Use  ? Smoking status: Former  ?  Packs/day: 1.00  ?  Years: 20.00  ?  Pack years: 20.00  ?  Types: Cigarettes  ?  Quit date: 04/04/2003  ?  Years since quitting: 18.3  ? Smokeless tobacco: Never  ?Vaping Use  ? Vaping Use: Never used  ?Substance Use Topics  ? Alcohol use: Yes  ?  Comment: Very  Rare  ? Drug use: No  ? ? ? ?Allergies   ?Naproxen and Codeine ? ? ?Review of Systems ?Review of Systems see HPI also ? ? ?Physical Exam ?Triage Vital Signs ?ED Triage Vitals  ?Enc Vitals Group  ?   BP 07/26/21 1103 124/87  ?   Pulse Rate 07/26/21 1103 72  ?   Resp 07/26/21 1103 18  ?   Temp 07/26/21 1103 98.1 ?F (36.7 ?C)  ?   Temp Source 07/26/21 1103 Oral  ?   SpO2 07/26/21 1103 98 %  ?   Weight 07/26/21 1101 290 lb (131.5 kg)  ?   Height 07/26/21 1101 '5\' 5"'$  (1.651 m)  ?   Pain Score 07/26/21 1101 4  ?   Pain Loc --   ? ? ?Updated Vital Signs ?BP 124/87 (BP Location: Left Arm)   Pulse 72   Temp 98.1 ?F (36.7 ?C) (Oral)   Resp 18   Ht '5\' 5"'$  (1.651 m)   Wt 131.5 kg   SpO2 98%   BMI 48.26 kg/m?  ? ?Physical Exam ?Constitutional:   ?   General: She is not in acute distress. ?   Appearance: She is not ill-appearing or toxic-appearing.  ?   Comments: Good hygiene  ?HENT:  ?   Head: Atraumatic.  ?   Comments:  Bilateral TMs are dull, no erythema ?Marked nasal congestion bilaterally with mucousy material present ?Posterior pharynx is mildly injected ?   Mouth/Throat:  ?   Mouth: Mucous membranes are moist.  ?Eyes:  ?   Conjunctiva/sclera:  ?   Right eye: Right conjunctiva is not injected. No exudate. ?    Left eye: Left conjunctiva is not injected. No exudate. ?   Comments:   Conjugate gaze observed  ?Cardiovascular:  ?   Rate and Rhythm: Normal rate and regular rhythm.  ?Pulmonary:  ?   Effort: Pulmonary effor

## 2021-08-19 DIAGNOSIS — G4733 Obstructive sleep apnea (adult) (pediatric): Secondary | ICD-10-CM | POA: Diagnosis not present

## 2021-09-09 DIAGNOSIS — R1011 Right upper quadrant pain: Secondary | ICD-10-CM | POA: Diagnosis not present

## 2021-09-09 DIAGNOSIS — Z87891 Personal history of nicotine dependence: Secondary | ICD-10-CM | POA: Diagnosis not present

## 2021-09-09 DIAGNOSIS — G4733 Obstructive sleep apnea (adult) (pediatric): Secondary | ICD-10-CM | POA: Diagnosis not present

## 2021-09-09 DIAGNOSIS — Z885 Allergy status to narcotic agent status: Secondary | ICD-10-CM | POA: Diagnosis not present

## 2021-09-09 DIAGNOSIS — R1110 Vomiting, unspecified: Secondary | ICD-10-CM | POA: Diagnosis not present

## 2021-09-09 DIAGNOSIS — Z9989 Dependence on other enabling machines and devices: Secondary | ICD-10-CM | POA: Diagnosis not present

## 2021-09-09 DIAGNOSIS — R109 Unspecified abdominal pain: Secondary | ICD-10-CM | POA: Diagnosis not present

## 2021-09-09 DIAGNOSIS — Z6841 Body Mass Index (BMI) 40.0 and over, adult: Secondary | ICD-10-CM | POA: Diagnosis not present

## 2021-09-19 DIAGNOSIS — G4733 Obstructive sleep apnea (adult) (pediatric): Secondary | ICD-10-CM | POA: Diagnosis not present

## 2021-10-18 DIAGNOSIS — G4733 Obstructive sleep apnea (adult) (pediatric): Secondary | ICD-10-CM | POA: Diagnosis not present

## 2021-11-18 DIAGNOSIS — G4733 Obstructive sleep apnea (adult) (pediatric): Secondary | ICD-10-CM | POA: Diagnosis not present

## 2021-11-24 DIAGNOSIS — M797 Fibromyalgia: Secondary | ICD-10-CM | POA: Diagnosis not present

## 2021-11-24 DIAGNOSIS — G43719 Chronic migraine without aura, intractable, without status migrainosus: Secondary | ICD-10-CM | POA: Diagnosis not present

## 2021-11-24 DIAGNOSIS — M5481 Occipital neuralgia: Secondary | ICD-10-CM | POA: Diagnosis not present

## 2021-11-24 DIAGNOSIS — G629 Polyneuropathy, unspecified: Secondary | ICD-10-CM | POA: Diagnosis not present

## 2021-11-24 DIAGNOSIS — F5101 Primary insomnia: Secondary | ICD-10-CM | POA: Diagnosis not present

## 2021-12-12 DIAGNOSIS — A499 Bacterial infection, unspecified: Secondary | ICD-10-CM | POA: Diagnosis not present

## 2021-12-15 DIAGNOSIS — Z20822 Contact with and (suspected) exposure to covid-19: Secondary | ICD-10-CM | POA: Diagnosis not present

## 2021-12-15 DIAGNOSIS — R6889 Other general symptoms and signs: Secondary | ICD-10-CM | POA: Diagnosis not present

## 2021-12-15 DIAGNOSIS — R059 Cough, unspecified: Secondary | ICD-10-CM | POA: Diagnosis not present

## 2021-12-19 DIAGNOSIS — G4733 Obstructive sleep apnea (adult) (pediatric): Secondary | ICD-10-CM | POA: Diagnosis not present

## 2022-01-02 ENCOUNTER — Encounter: Payer: Self-pay | Admitting: Nurse Practitioner

## 2022-01-04 ENCOUNTER — Other Ambulatory Visit: Payer: Self-pay | Admitting: Nurse Practitioner

## 2022-01-04 ENCOUNTER — Encounter: Payer: Self-pay | Admitting: Nurse Practitioner

## 2022-01-04 ENCOUNTER — Ambulatory Visit: Payer: BC Managed Care – PPO | Admitting: Nurse Practitioner

## 2022-01-04 ENCOUNTER — Other Ambulatory Visit: Payer: Self-pay

## 2022-01-04 VITALS — BP 138/80 | HR 94 | Temp 98.5°F | Resp 18 | Ht 65.0 in | Wt 311.7 lb

## 2022-01-04 DIAGNOSIS — N951 Menopausal and female climacteric states: Secondary | ICD-10-CM | POA: Diagnosis not present

## 2022-01-04 DIAGNOSIS — F411 Generalized anxiety disorder: Secondary | ICD-10-CM | POA: Diagnosis not present

## 2022-01-04 DIAGNOSIS — Z23 Encounter for immunization: Secondary | ICD-10-CM | POA: Diagnosis not present

## 2022-01-04 DIAGNOSIS — G4700 Insomnia, unspecified: Secondary | ICD-10-CM

## 2022-01-04 MED ORDER — BELSOMRA 5 MG PO TABS: 5.0000 mg | ORAL_TABLET | Freq: Every evening | ORAL | 0 refills | Status: DC | PRN

## 2022-01-04 MED ORDER — CLONIDINE HCL 0.1 MG PO TABS: 0.1000 mg | ORAL_TABLET | Freq: Every day | ORAL | 0 refills | Status: DC

## 2022-01-04 NOTE — Assessment & Plan Note (Signed)
Patient has tried [place], magnesium and life style modification to help with sleeping and it has not helped.  Will trial belsomra.

## 2022-01-04 NOTE — Telephone Encounter (Signed)
Requested medication (s) are due for refill today: no  Requested medication (s) are on the active medication list: yes  Last refill:  today #30/0  Future visit scheduled: yes  Notes to clinic:  pharmacy is requesting 90 DS. This is new medication so wanting to make sure ok?    Requested Prescriptions  Pending Prescriptions Disp Refills   cloNIDine (CATAPRES) 0.1 MG tablet [Pharmacy Med Name: CLONIDINE 0.'1MG'$  TABLETS] 90 tablet     Sig: TAKE 1 TABLET(0.1 MG) BY MOUTH AT BEDTIME     Cardiovascular:  Alpha-2 Agonists Passed - 01/04/2022  9:06 AM      Passed - Last BP in normal range    BP Readings from Last 1 Encounters:  01/04/22 138/80         Passed - Last Heart Rate in normal range    Pulse Readings from Last 1 Encounters:  01/04/22 94         Passed - Valid encounter within last 6 months    Recent Outpatient Visits           Today Vasomotor symptoms due to menopause   [facility] Bo Merino, FNP   9 months ago Annual physical exam   [facility] Bo Merino, FNP   1 year ago Dysthymic disorder   [facility] Myles Gip, DO   1 year ago Adult general medical exam   [facility] Delsa Grana, PA-C   2 years ago Onychomycosis   [facility] Delsa Grana, PA-C       Future Appointments             In 3 months Jamie Hardin, Myna Hidalgo, [facility], [facility]

## 2022-01-04 NOTE — Assessment & Plan Note (Signed)
Continue taking Klonopin 0.5 mg as needed.  Recommend reaching out to insurance carrier regarding counselors in her network.

## 2022-01-04 NOTE — Progress Notes (Signed)
BP 138/80   Pulse 94   Temp 98.5 F (36.9 C) (Oral)   Resp 18   Ht '5\' 5"'$  (1.651 m)   Wt (!) 311 lb 11.2 oz (141.4 kg)   SpO2 96%   BMI 51.87 kg/m    Subjective:    Patient ID: Jamie Hardin, female    DOB: January 13, 1971, 51 y.o.   MRN: [id number]  HPI: Jamie Hardin is a 51 y.o. female  Chief Complaint  Patient presents with   Menopause   Vasomotor symptoms: Patient reports she had a hysterectomy in 2007. She says that she feels like she is going through second menopause.  She has insomnia, weight gain, anxiety, foggy headed and hot flashes. She has been on Effexor before but it gave her brain zappies and she did not like that.  Discussed trying clonidine.  She is interested in that.  Will start with clonidine 0.1 mg at bedtime.   Insomnia: She says she has not been sleeping well. She says she falls asleep fine but wakes up in the middle of the night and can not go back to sleep.  She has tried nyquil, magnesium and ambien.  Will trial belsomra.  Anxiety:  She says her anxiety has been worse lately. She currently takes clonazepam 0.5 mg as needed.  She does not like psychiatric medications. She has tried [employer], retail.  Recommend she call her insurance company and find a Social worker covered by her insurance.     02/15/2015    8:35 AM  GAD 7 : Generalized Anxiety Score  Nervous, Anxious, on Edge 3  Control/stop worrying 3  Worry too much - different things 3  Trouble relaxing 3  Restless 0  Easily annoyed or irritable 3  Afraid - awful might happen 2  Total GAD 7 Score 17  Anxiety Difficulty Very difficult      01/04/2022    8:22 AM 04/01/2021    8:35 AM 12/22/2020   10:54 AM 03/15/2020    1:46 PM 07/17/2019    8:09 AM  Depression screen PHQ 2/9  Decreased Interest 0 0 0 0 0  Down, Depressed, Hopeless 0 0 3 0 0  PHQ - 2 Score 0 0 3 0 0  Altered sleeping  0 3  0  Tired, decreased energy  0 3  0  Change in appetite  0 0  0  Feeling bad or failure about yourself    0 0  0  Trouble concentrating  0 1  0  Moving slowly or fidgety/restless  0 0  0  Suicidal thoughts  0 0  0  PHQ-9 Score  0 10  0  Difficult doing work/chores  Not difficult at all Somewhat difficult  Not difficult at all      Relevant past medical, surgical, family and social history reviewed and updated as indicated. Interim medical history since our last visit reviewed. Allergies and medications reviewed and updated.  Review of Systems  Constitutional: Negative for fever or weight change.  Respiratory: Negative for cough and shortness of breath.   Cardiovascular: Negative for chest pain or palpitations.  Gastrointestinal: Negative for abdominal pain, no bowel changes.  Musculoskeletal: Negative for gait problem or joint swelling.  Skin: Negative for rash.  Neurological: Negative for dizziness or headache.  No other specific complaints in a complete review of systems (except as listed in HPI above).      Objective:    BP 138/80   Pulse 94  Temp 98.5 F (36.9 C) (Oral)   Resp 18   Ht '5\' 5"'$  (1.651 m)   Wt (!) 311 lb 11.2 oz (141.4 kg)   SpO2 96%   BMI 51.87 kg/m   Wt Readings from Last 3 Encounters:  01/04/22 (!) 311 lb 11.2 oz (141.4 kg)  07/26/21 290 lb (131.5 kg)  05/30/21 290 lb (131.5 kg)    Physical Exam  Constitutional: Patient appears well-developed and well-nourished. Obese  No distress.  HEENT: head atraumatic, normocephalic, pupils equal and reactive to light,  neck supple Cardiovascular: Normal rate, regular rhythm and normal heart sounds.  No murmur heard. No BLE edema. Pulmonary/Chest: Effort normal and breath sounds normal. No respiratory distress. Abdominal: Soft.  There is no tenderness. Psychiatric: Patient has a normal mood and affect. behavior is normal. Judgment and thought content normal.  Results for orders placed or performed during the hospital encounter of 07/26/21  Group A Strep by PCR   Specimen: Throat; Sterile Swab  Result Value Ref  Range   Group A Strep by PCR NOT DETECTED NOT DETECTED      Assessment & Plan:   Problem List Items Addressed This Visit       Other   GAD (generalized anxiety disorder)    Continue taking Klonopin 0.5 mg as needed.  Recommend reaching out to insurance carrier regarding counselors in her network.       Insomnia    Patient has tried [place], magnesium and life style modification to help with sleeping and it has not helped.  Will trial belsomra.       Relevant Medications   Suvorexant (BELSOMRA) 5 MG TABS   Vasomotor symptoms due to menopause - Primary    Patient does not tolerate effexor.  Will trial clonidine 0.1 mg at bedtime. Patient can also try Black Cohosh.       Relevant Medications   cloNIDine (CATAPRES) 0.1 MG tablet   Other Visit Diagnoses     Need for influenza vaccination       Relevant Orders   Flu Vaccine QUAD 6+ mos PF IM (Fluarix Quad PF) (Completed)        Follow up plan: Return if symptoms worsen or fail to improve.

## 2022-01-04 NOTE — Assessment & Plan Note (Addendum)
Patient does not tolerate effexor.  Will trial clonidine 0.1 mg at bedtime. Patient can also try Black Cohosh.

## 2022-01-30 ENCOUNTER — Telehealth: Payer: Self-pay | Admitting: Family Medicine

## 2022-01-30 NOTE — Telephone Encounter (Signed)
I can not view any PA that has been done through Covermymeds for her, she seen Almyra Free and she prescribed her this. Have you done it?

## 2022-01-30 NOTE — Telephone Encounter (Signed)
Copied from [place] [phone]. Topic: General - Other >> Jan 30, 2022  3:01 PM Everette C wrote: Reason for CRM: The patient has called to follow up on their request for prior authorization of their Suvorexant ([place]) 5 MG TABS [[id number]]   Please contact further when possible

## 2022-01-31 NOTE — Telephone Encounter (Signed)
Its sent and I sent again

## 2022-02-01 DIAGNOSIS — G4733 Obstructive sleep apnea (adult) (pediatric): Secondary | ICD-10-CM | POA: Diagnosis not present

## 2022-02-02 NOTE — Telephone Encounter (Signed)
Pt called about the PA for Suvorexant (BELSOMRA) 5 MG TAB/ she has not heard back about this and would like a call with an update asap / please advise

## 2022-02-02 NOTE — Telephone Encounter (Signed)
It says apprroved

## 2022-02-02 NOTE — Telephone Encounter (Signed)
Pt notified- verbalized understanding.

## 2022-02-02 NOTE — Telephone Encounter (Signed)
Was it approved or denied?

## 2022-02-16 DIAGNOSIS — J018 Other acute sinusitis: Secondary | ICD-10-CM | POA: Diagnosis not present

## 2022-03-02 DIAGNOSIS — H04123 Dry eye syndrome of bilateral lacrimal glands: Secondary | ICD-10-CM | POA: Diagnosis not present

## 2022-03-03 DIAGNOSIS — G4733 Obstructive sleep apnea (adult) (pediatric): Secondary | ICD-10-CM | POA: Diagnosis not present

## 2022-03-10 DIAGNOSIS — H04202 Unspecified epiphora, left lacrimal gland: Secondary | ICD-10-CM | POA: Diagnosis not present

## 2022-03-15 DIAGNOSIS — H5712 Ocular pain, left eye: Secondary | ICD-10-CM | POA: Diagnosis not present

## 2022-03-24 DIAGNOSIS — H04552 Acquired stenosis of left nasolacrimal duct: Secondary | ICD-10-CM | POA: Diagnosis not present

## 2022-04-03 DIAGNOSIS — G4733 Obstructive sleep apnea (adult) (pediatric): Secondary | ICD-10-CM | POA: Diagnosis not present

## 2022-04-04 NOTE — Progress Notes (Unsigned)
Name: Jamie Hardin   MRN: [id number]    DOB: Jul 03, 1970   Date:04/05/2022       Progress Note  Subjective  Chief Complaint  Chief Complaint  Patient presents with   Annual Exam    HPI  Patient presents for annual CPE.  Diet: well balanced diet, she eats out about twice a week Exercise: not currently exercising, discussed increasing physical activity to 150 minutes a week Sleep: 4 hours sleep, she tried belsomra with no success.  Will try ambien. Last dental exam:every 6 months Last eye exam: multiple appointments this year, she is also getting tear duct surgery in February.   [place] Office Visit from 04/05/2022 in [facility]  AUDIT-C Score 0      Depression: Phq 9 is  negative    04/05/2022    9:32 AM 04/05/2022    8:27 AM 01/04/2022    8:22 AM 04/01/2021    8:35 AM 12/22/2020   10:54 AM  Depression screen PHQ 2/9  Decreased Interest 0 0 0 0 0  Down, Depressed, Hopeless 0 0 0 0 3  PHQ - 2 Score 0 0 0 0 3  Altered sleeping 0   0 3  Tired, decreased energy 0   0 3  Change in appetite 0   0 0  Feeling bad or failure about yourself  0   0 0  Trouble concentrating 0   0 1  Moving slowly or fidgety/restless 0   0 0  Suicidal thoughts 0   0 0  PHQ-9 Score 0   0 10  Difficult doing work/chores    Not difficult at all Somewhat difficult   Hypertension: BP Readings from Last 3 Encounters:  04/05/22 120/72  01/04/22 138/80  07/26/21 124/87   Obesity: Wt Readings from Last 3 Encounters:  04/05/22 (!) 322 lb 11.2 oz (146.4 kg)  01/04/22 (!) 311 lb 11.2 oz (141.4 kg)  07/26/21 290 lb (131.5 kg)   BMI Readings from Last 3 Encounters:  04/05/22 53.70 kg/m  01/04/22 51.87 kg/m  07/26/21 48.26 kg/m     Vaccines:  HPV: up to at age 5 , ask insurance if age between 54-45  Shingrix: 75-64 yo and ask insurance if covered when patient above 107 yo Pneumonia:  educated and discussed with patient. Flu:  educated and discussed with patient.  Hep C  Screening: 04/27/2015 STD testing and prevention (HIV/chl/gon/syphilis): 04/27/2015 Intimate partner violence:none Sexual History : yes, husband Menstrual History/LMP/Abnormal Bleeding: hysterectomy Incontinence Symptoms: stress incontinence  Breast cancer:  - Last Mammogram: 02/03/2021, due, she is scheduled - BRCA gene screening: none  Osteoporosis: Discussed high calcium and vitamin D supplementation, weight bearing exercises  Cervical cancer screening: hysterectomy  Skin cancer: Discussed monitoring for atypical lesions  Colorectal cancer: 05/30/2021   Lung cancer:   Low Dose CT Chest recommended if Age 11-80 years, 20 pack-year currently smoking OR have quit w/in 15years. Patient does not qualify.   ECG: 03/24/2020  Advanced Care Planning: A voluntary discussion about advance care planning including the explanation and discussion of advance directives.  Discussed health care proxy and Living will, and the patient was able to identify a health care proxy as Jamie Hardin, Husband.  Patient does not have a living will at present time. If patient does have living will, I have requested they bring this to the clinic to be scanned in to their chart.  Lipids: Lab Results  Component Value Date   CHOL 142 01/16/2018  CHOL 175 01/15/2017   CHOL 152 01/14/2016   Lab Results  Component Value Date   HDL 38 (L) 01/16/2018   HDL 64 01/15/2017   HDL 51 01/14/2016   Lab Results  Component Value Date   LDLCALC 84 01/16/2018   LDLCALC 92 01/15/2017   LDLCALC 81 01/14/2016   Lab Results  Component Value Date   TRIG 104 01/16/2018   TRIG 91 01/15/2017   TRIG 101 01/14/2016   Lab Results  Component Value Date   CHOLHDL 3.7 01/16/2018   CHOLHDL 2.7 01/15/2017   CHOLHDL 3.0 01/14/2016   No results found for: "LDLDIRECT"  Glucose: Glucose  Date Value Ref Range Status  04/13/2012 95 65 - 99 mg/dL Final   Glucose, Bld  Date Value Ref Range Status  03/23/2020 95 70 - 99 mg/dL Final     Comment:    Glucose reference range applies only to samples taken after fasting for at least 8 hours.  06/16/2019 86 70 - 99 mg/dL Final    Comment:    Glucose reference range applies only to samples taken after fasting for at least 8 hours. Performed at [facility], [address] [place], [place] [address]   01/16/2018 89 65 - 99 mg/dL Final    Comment:    .            Fasting reference interval .     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   Newly recognized heart murmur 08/29/2017   PAC (premature atrial contraction) 08/29/2017   DDD (degenerative disc disease), cervical 07/11/2017   Pain in joint involving ankle and foot 07/11/2017   Low back pain 07/11/2017   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   Influenza vaccination declined by patient 01/15/2017   Contusion of right knee 11/15/2016   Subluxation of shoulder joint 11/15/2016   Abnormal mammogram of both breasts 02/04/2016   History of pneumonia as indication for 23-polyvalent pneumococcal polysaccharide vaccine 01/14/2016   Preventative health care 01/14/2016   Facial pain, atypical 04/27/2015   Eustachian tube dysfunction 04/27/2015   Neoplasm of skin of neck 04/27/2015   Abnormal thyroid function test 02/15/2015   Rash 02/15/2015   Breast cancer screening 01/12/2015   Elevated blood-pressure reading without diagnosis of hypertension 01/12/2015   GAD (generalized anxiety disorder) 01/12/2015   OSA on CPAP 01/12/2015   Allergy    Asthma    Morbid obesity ([place])    Dysthymic disorder     GERD (gastroesophageal reflux disease)    Fibromyalgia 07/22/2014   Intractable chronic migraine without aura and without status migrainosus 07/22/2014   Morbid obesity with BMI of 50.0-59.9, adult ([place]) 07/22/2014    Past Surgical History:  Procedure Laterality Date   ABDOMINAL HYSTERECTOMY  2007   complete due to endometriosis   CHOLECYSTECTOMY  01/2018   COLONOSCOPY WITH PROPOFOL N/A 05/30/2021   Procedure: COLONOSCOPY WITH PROPOFOL;  Surgeon: [name], MD;  Location: [facility] ENDOSCOPY;  Service: Gastroenterology;  Laterality: N/A;   Laparoscopic sleeve gastrectomy  11/09/2017   NASAL SINUS SURGERY  1996 and 1999   x 2  Family History  Problem Relation Age of Onset   Fibromyalgia Mother    Diabetes Mother    Cancer Father        prostate   Diabetes Maternal Aunt    Diabetes Paternal Aunt    Scleroderma Maternal Grandmother    Heart disease Maternal Grandfather    Hypertension Neg Hx    Hyperlipidemia Neg Hx    Stroke Neg Hx    COPD Neg Hx     Social History   Socioeconomic History   Marital status: Married    Spouse name: Dominica Severin   Number of children: 3   Years of education: 14   Highest education level: Associate degree: occupational, Hotel manager, or vocational program  Occupational History   Not on file  Tobacco Use   Smoking status: Former    Packs/day: 1.00    Years: 20.00    Total pack years: 20.00    Types: Cigarettes    Quit date: 04/04/2003    Years since quitting: 19.0   Smokeless tobacco: Never  Vaping Use   Vaping Use: Never used  Substance and Sexual Activity   Alcohol use: Yes    Comment: Very Rare   Drug use: No   Sexual activity: Yes    Birth control/protection: Surgical    Comment: Total hystertomy   Other Topics Concern   Not on file  Social History Narrative   Not on file   Social Determinants of Health   Financial Resource Strain: Low Risk  (04/05/2022)   Overall Financial Resource Strain (CARDIA)    Difficulty of Paying Living  Expenses: Not hard at all  Food Insecurity: No Food Insecurity (04/05/2022)   Hunger Vital Sign    Worried About Running Out of Food in the Last Year: Never true    Ran Out of Food in the Last Year: Never true  Transportation Needs: No Transportation Needs (04/05/2022)   PRAPARE - Hydrologist (Medical): No    Lack of Transportation (Non-Medical): No  Physical Activity: Insufficiently Active (04/05/2022)   Exercise Vital Sign    Days of Exercise per Week: 1 day    Minutes of Exercise per Session: 20 min  Stress: Stress Concern Present (04/05/2022)   [place]    Feeling of Stress : Very much  Social Connections: Moderately Isolated (04/05/2022)   Social Connection and Isolation Panel [NHANES]    Frequency of Communication with Friends and Family: More than three times a week    Frequency of Social Gatherings with Friends and Family: More than three times a week    Attends Religious Services: Never    Marine scientist or Organizations: No    Attends Archivist Meetings: Never    Marital Status: Married  Human resources officer Violence: Unknown (04/05/2022)   Humiliation, Afraid, Rape, and Kick questionnaire    Fear of Current or Ex-Partner: No    Emotionally Abused: No    Physically Abused: No    Sexually Abused: Not on file     Current Outpatient Medications:    albuterol (ACCUNEB) 0.63 MG/3ML nebulizer solution, USE 1 VIAL VIA NEBULIZER EVERY 4 HOURS AS NEEDED FOR WHEEZING, Disp: 75 mL, Rfl: 3   albuterol (VENTOLIN HFA) 108 (90 Base) MCG/ACT inhaler, Inhale 2 puffs into the lungs every 6 (six) hours as needed for wheezing or shortness of breath., Disp: 18 g, Rfl: 2   beclomethasone (QVAR) 40 MCG/ACT inhaler,  Inhale 2 puffs into the lungs 2 (two) times daily., Disp: 1 each, Rfl: 1   calcium-vitamin D 250-100 MG-UNIT tablet, Take 1 tablet by mouth 2 (two) times daily., Disp: , Rfl:     clonazePAM (KLONOPIN) 0.5 MG tablet, Take 1 tablet (0.5 mg total) by mouth 2 (two) times daily as needed., Disp: 15 tablet, Rfl: 0   gabapentin (NEURONTIN) 300 MG capsule, Take 300 mg by mouth 3 (three) times daily., Disp: , Rfl:    ibuprofen (ADVIL) 200 MG tablet, Take 1-2 tablets (200-400 mg total) by mouth every 6 (six) hours as needed., Disp: , Rfl:    ipratropium-albuterol (DUONEB) 0.5-2.5 (3) MG/3ML SOLN, Take 3 mLs by nebulization 3 (three) times daily as needed., Disp: 180 mL, Rfl: 1   Multiple Vitamins-Minerals (MULTIVITAMIN WITH MINERALS) tablet, Take 1 tablet by mouth daily., Disp: , Rfl:    ondansetron (ZOFRAN ODT) 8 MG disintegrating tablet, Take 1 tablet (8 mg total) by mouth every 8 (eight) hours as needed for nausea or vomiting., Disp: 20 tablet, Rfl: 0   pantoprazole (PROTONIX) 40 MG tablet, Take 40 mg by mouth daily., Disp: , Rfl:    SUMAtriptan (IMITREX) 50 MG tablet, Take 1-2 tablets (50-100 mg total) by mouth every 2 (two) hours as needed for migraine. May repeat in 2 hours if headache persists or recurs., Disp: 10 tablet, Rfl: 2   tiZANidine (ZANAFLEX) 4 MG tablet, Take 4 mg by mouth every 6 (six) hours as needed for muscle spasms., Disp: , Rfl:    valACYclovir (VALTREX) 1000 MG tablet, TAKE 2 TABLETS BY MOUTH AT FIRST SIGN OF OUTBREAK FOLLOWED BY 2 TABLETS MORE 12 HOURS LATER, Disp: 4 tablet, Rfl: 5   zolpidem (AMBIEN) 5 MG tablet, Take 1 tablet (5 mg total) by mouth at bedtime as needed for sleep., Disp: 30 tablet, Rfl: 0   Erenumab-aooe (AIMOVIG) 70 MG/ML SOAJ, Inject 70 mg into the skin every 28 (twenty-eight) days., Disp: , Rfl:   Allergies  Allergen Reactions   Naproxen Hives   Codeine Nausea And Vomiting     ROS  Constitutional: Negative for fever or weight change.  Respiratory: Negative for cough and shortness of breath.   Cardiovascular: Negative for chest pain or palpitations.  Gastrointestinal: Negative for abdominal pain, no bowel changes.  Musculoskeletal:  Negative for gait problem or joint swelling.  Skin: Negative for rash.  Neurological: Negative for dizziness or headache.  No other specific complaints in a complete review of systems (except as listed in HPI above).   Objective  Vitals:   04/05/22 0821  BP: 120/72  Pulse: 100  Resp: 18  Temp: 97.9 F (36.6 C)  TempSrc: Oral  SpO2: 98%  Weight: (!) 322 lb 11.2 oz (146.4 kg)  Height: _0  (1.651 m)    Body mass index is 53.7 kg/m.  Physical Exam  Constitutional: Patient appears well-developed and well-nourished. No distress.  HENT: Head: Normocephalic and atraumatic. Ears: B TMs ok, no erythema or effusion; Nose: Nose normal. Mouth/Throat: Oropharynx is clear and moist. No oropharyngeal exudate.  Eyes: Conjunctivae and EOM are normal. Pupils are equal, round, and reactive to light. No scleral icterus.  Neck: Normal range of motion. Neck supple. No JVD present. No thyromegaly present.  Cardiovascular: Normal rate, regular rhythm and normal heart sounds.  No murmur heard. No BLE edema. Pulmonary/Chest: Effort normal and breath sounds normal. No respiratory distress. Abdominal: Soft. Bowel sounds are normal, no distension. There is no tenderness. no masses Musculoskeletal: Normal range  of motion, no joint effusions. No gross deformities Neurological: he is alert and oriented to person, place, and time. No cranial nerve deficit. Coordination, balance, strength, speech and gait are normal.  Skin: Skin is warm and dry. No rash noted. No erythema.  Psychiatric: Patient has a normal mood and affect. behavior is normal. Judgment and thought content normal.   No results found for this or any previous visit (from the past 2160 hour(s)).   Fall Risk:    04/05/2022    8:26 AM 01/04/2022    8:22 AM 04/01/2021    8:35 AM 12/22/2020   10:54 AM 03/15/2020    1:46 PM  Fall Risk   Falls in the past year? 0 0 0 0 1  Number falls in past yr: 0 0 0 0 1  Injury with Fall? 0 0 0 0 1  Risk for  fall due to :     History of fall(s)  Follow up Falls evaluation completed Falls evaluation completed   Falls evaluation completed     Functional Status Survey: Is the patient deaf or have difficulty hearing?: No Does the patient have difficulty seeing, even when wearing glasses/contacts?: No Does the patient have difficulty concentrating, remembering, or making decisions?: Yes Does the patient have difficulty walking or climbing stairs?: Yes Does the patient have difficulty dressing or bathing?: No Does the patient have difficulty doing errands alone such as visiting a doctor's office or shopping?: No   Assessment & Plan  1. Annual physical exam Increase physical activity - CBC with Differential/Platelet - COMPLETE METABOLIC PANEL WITH GFR - Lipid panel - Hemoglobin A1c  2. Morbid obesity ([place])  - CBC with Differential/Platelet - COMPLETE METABOLIC PANEL WITH GFR - Lipid panel - Hemoglobin A1c  3. Screening for diabetes mellitus  - COMPLETE METABOLIC PANEL WITH GFR - Hemoglobin A1c  4. Screening for cholesterol level  - Lipid panel  5. Screening for deficiency anemia  - CBC with Differential/Platelet  6. Insomnia, unspecified type  - zolpidem (AMBIEN) 5 MG tablet; Take 1 tablet (5 mg total) by mouth at bedtime as needed for sleep.  Dispense: 30 tablet; Refill: 0   -USPSTF grade A and B recommendations reviewed with patient; age-appropriate recommendations, preventive care, screening tests, etc discussed and encouraged; healthy living encouraged; see AVS for patient education given to patient -Discussed importance of 150 minutes of physical activity weekly, eat two servings of fish weekly, eat one serving of tree nuts ( cashews, pistachios, pecans, almonds.Marland Kitchen) every other day, eat 6 servings of fruit/vegetables daily and drink plenty of water and avoid sweet beverages.   -Reviewed Health Maintenance: mammogram, scheduled, up to date on colorectal cancer screening

## 2022-04-05 ENCOUNTER — Ambulatory Visit (INDEPENDENT_AMBULATORY_CARE_PROVIDER_SITE_OTHER): Payer: BC Managed Care – PPO | Admitting: Nurse Practitioner

## 2022-04-05 ENCOUNTER — Other Ambulatory Visit: Payer: Self-pay

## 2022-04-05 ENCOUNTER — Encounter: Payer: Self-pay | Admitting: Nurse Practitioner

## 2022-04-05 VITALS — BP 120/72 | HR 100 | Temp 97.9°F | Resp 18 | Ht 65.0 in | Wt 322.7 lb

## 2022-04-05 DIAGNOSIS — Z13220 Encounter for screening for lipoid disorders: Secondary | ICD-10-CM

## 2022-04-05 DIAGNOSIS — G4700 Insomnia, unspecified: Secondary | ICD-10-CM

## 2022-04-05 DIAGNOSIS — Z1231 Encounter for screening mammogram for malignant neoplasm of breast: Secondary | ICD-10-CM

## 2022-04-05 DIAGNOSIS — Z131 Encounter for screening for diabetes mellitus: Secondary | ICD-10-CM

## 2022-04-05 DIAGNOSIS — Z130 Encounter for screening for diseases of the blood and blood-forming organs and certain disorders involving the immune mechanism: Secondary | ICD-10-CM

## 2022-04-05 DIAGNOSIS — Z0000 Encounter for general adult medical examination without abnormal findings: Secondary | ICD-10-CM

## 2022-04-05 MED ORDER — ZOLPIDEM TARTRATE 5 MG PO TABS: 5.0000 mg | ORAL_TABLET | Freq: Every evening | ORAL | 0 refills | Status: DC | PRN

## 2022-04-06 LAB — LIPID PANEL
Cholesterol: 175 mg/dL (ref <200)
HDL: 59 mg/dL (ref >=50)
LDL Cholesterol (Calc): 97 mg/dL (calc)
Non-HDL Cholesterol (Calc): 116 mg/dL (calc) (ref <130)
Total CHOL/HDL Ratio: 3 (calc) (ref <5.0)
Triglycerides: 94 mg/dL (ref <150)

## 2022-04-06 LAB — CBC WITH DIFFERENTIAL/PLATELET
Absolute Monocytes: 359 cells/uL (ref 200–950)
Basophils Absolute: 29 cells/uL (ref 0–200)
Basophils Relative: 0.5 %
Eosinophils Absolute: 182 cells/uL (ref 15–500)
Eosinophils Relative: 3.2 %
HCT: 39.1 % (ref 35.0–45.0)
Hemoglobin: 13.2 g/dL (ref 11.7–15.5)
Lymphs Abs: 1522 cells/uL (ref 850–3900)
MCH: 27.8 pg (ref 27.0–33.0)
MCHC: 33.8 g/dL (ref 32.0–36.0)
MCV: 82.5 fL (ref 80.0–100.0)
MPV: 10.4 fL (ref 7.5–12.5)
Monocytes Relative: 6.3 %
Neutro Abs: 3608 cells/uL (ref 1500–7800)
Neutrophils Relative %: 63.3 %
Platelets: 288 10*3/uL (ref 140–400)
RBC: 4.74 10*6/uL (ref 3.80–5.10)
RDW: 12.2 % (ref 11.0–15.0)
Total Lymphocyte: 26.7 %
WBC: 5.7 10*3/uL (ref 3.8–10.8)

## 2022-04-06 LAB — COMPLETE METABOLIC PANEL WITH GFR
AG Ratio: 1.5 (calc) (ref 1.0–2.5)
ALT: 16 U/L (ref 6–29)
AST: 16 U/L (ref 10–35)
Albumin: 3.9 g/dL (ref 3.6–5.1)
Alkaline phosphatase (APISO): 100 U/L (ref 31–125)
BUN: 17 mg/dL (ref 7–25)
CO2: 29 mmol/L (ref 20–32)
Calcium: 9 mg/dL (ref 8.6–10.2)
Chloride: 105 mmol/L (ref 98–110)
Creat: 0.71 mg/dL (ref 0.50–0.99)
Globulin: 2.6 g/dL (calc) (ref 1.9–3.7)
Glucose, Bld: 84 mg/dL (ref 65–99)
Potassium: 4 mmol/L (ref 3.5–5.3)
Sodium: 141 mmol/L (ref 135–146)
Total Bilirubin: 0.4 mg/dL (ref 0.2–1.2)
Total Protein: 6.5 g/dL (ref 6.1–8.1)
eGFR: 104 mL/min/{1.73_m2} (ref >=60)

## 2022-04-06 LAB — HEMOGLOBIN A1C
Hgb A1c MFr Bld: 5.7 % of total Hgb — ABNORMAL HIGH (ref <5.7)
Mean Plasma Glucose: 117 mg/dL
eAG (mmol/L): 6.5 mmol/L

## 2022-04-10 ENCOUNTER — Encounter: Payer: Self-pay | Admitting: Nurse Practitioner

## 2022-04-10 ENCOUNTER — Other Ambulatory Visit: Payer: Self-pay | Admitting: Nurse Practitioner

## 2022-04-10 DIAGNOSIS — J4521 Mild intermittent asthma with (acute) exacerbation: Secondary | ICD-10-CM

## 2022-04-10 MED ORDER — PREDNISONE 10 MG (21) PO TBPK: ORAL_TABLET | ORAL | 0 refills | Status: DC

## 2022-04-15 DIAGNOSIS — R0600 Dyspnea, unspecified: Secondary | ICD-10-CM | POA: Diagnosis not present

## 2022-04-15 DIAGNOSIS — R051 Acute cough: Secondary | ICD-10-CM | POA: Diagnosis not present

## 2022-05-03 DIAGNOSIS — G4733 Obstructive sleep apnea (adult) (pediatric): Secondary | ICD-10-CM | POA: Diagnosis not present

## 2022-05-04 ENCOUNTER — Other Ambulatory Visit: Payer: Self-pay | Admitting: Nurse Practitioner

## 2022-05-04 DIAGNOSIS — G4733 Obstructive sleep apnea (adult) (pediatric): Secondary | ICD-10-CM | POA: Diagnosis not present

## 2022-05-04 DIAGNOSIS — G4700 Insomnia, unspecified: Secondary | ICD-10-CM

## 2022-05-04 NOTE — Telephone Encounter (Signed)
Pt could not schedule until 05/16/22 due to having eye surgery and unable to get off work, unless she is able to do a virtual? Pt will be out of meds in 4 days

## 2022-05-04 NOTE — Telephone Encounter (Signed)
Pt is scheduled for 05/05/22

## 2022-05-05 ENCOUNTER — Encounter: Payer: Self-pay | Admitting: Family Medicine

## 2022-05-05 ENCOUNTER — Telehealth (INDEPENDENT_AMBULATORY_CARE_PROVIDER_SITE_OTHER): Payer: BC Managed Care – PPO | Admitting: Family Medicine

## 2022-05-05 ENCOUNTER — Telehealth: Payer: Self-pay | Admitting: Family Medicine

## 2022-05-05 DIAGNOSIS — G4700 Insomnia, unspecified: Secondary | ICD-10-CM

## 2022-05-05 DIAGNOSIS — Z1231 Encounter for screening mammogram for malignant neoplasm of breast: Secondary | ICD-10-CM

## 2022-05-05 MED ORDER — QUETIAPINE FUMARATE 25 MG PO TABS: 25.0000 mg | ORAL_TABLET | Freq: Every day | ORAL | 0 refills | Status: DC

## 2022-05-05 MED ORDER — ZOLPIDEM TARTRATE 5 MG PO TABS: 5.0000 mg | ORAL_TABLET | Freq: Every evening | ORAL | 0 refills | Status: DC | PRN

## 2022-05-05 NOTE — Telephone Encounter (Signed)
Per your request to let her know she needs to call and schedule her a mammogram. She said she has an appt with [facility] on June 02, 2022 scheduled.

## 2022-05-05 NOTE — Progress Notes (Signed)
Name: Jamie Hardin   MRN: [id number]    DOB: 1970/08/27   Date:05/05/2022       Progress Note  Subjective  Chief Complaint  Medication Refill  I connected with  Jamie Hardin  on 05/05/22 at  8:20 AM EST by a video enabled telemedicine application and verified that I am speaking with the correct person using two identifiers.  I discussed the limitations of evaluation and management by telemedicine and the availability of in person appointments. The patient expressed understanding and agreed to proceed with the virtual visit  Staff also discussed with the patient that there may be a patient responsible charge related to this service. Patient Location: parked in her car  Provider Location: [facility] Additional Individuals present: alone   HPI  Insomnia: she has tried Elavil, Trazodone and is currently taking Ambien. She states it helps her fall asleep but waking up int he middle of the night and falls back asleep a little later. She states is the medication that has worked the best for her so far. Discussed tachyphylaxis and also risk of dependency, offered to send Seroquel for off label indication for sleep . Try to alternate with Ambien and see which one works best. [name] her to send me a message for a refill through my chart for a 90 day supply once she decides which medication she prefers. 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   Newly recognized heart murmur 08/29/2017   PAC (premature atrial contraction) 08/29/2017   DDD (degenerative disc disease), cervical 07/11/2017    Pain in joint involving ankle and foot 07/11/2017   Low back pain 07/11/2017   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   Influenza vaccination declined by patient 01/15/2017   Contusion of right knee 11/15/2016   Subluxation of shoulder joint 11/15/2016   Abnormal mammogram of both breasts 02/04/2016   History of pneumonia as indication for 23-polyvalent pneumococcal polysaccharide vaccine 01/14/2016   Preventative health care 01/14/2016   Facial pain, atypical 04/27/2015   Eustachian tube dysfunction 04/27/2015   Neoplasm of skin of neck 04/27/2015   Abnormal thyroid function test 02/15/2015   Rash 02/15/2015   Breast cancer screening 01/12/2015   Elevated blood-pressure reading without diagnosis of hypertension 01/12/2015   GAD (generalized anxiety disorder) 01/12/2015   OSA on CPAP 01/12/2015   Allergy    Asthma    Morbid obesity ([place])    Dysthymic disorder    GERD (gastroesophageal reflux disease)    Fibromyalgia 07/22/2014   Intractable chronic migraine without aura and without status migrainosus 07/22/2014   Morbid obesity with BMI of 50.0-59.9, adult ([place]) 07/22/2014    Past Surgical History:  Procedure Laterality Date   ABDOMINAL HYSTERECTOMY  2007   complete due to endometriosis   CHOLECYSTECTOMY  01/2018   COLONOSCOPY WITH PROPOFOL N/A 05/30/2021   Procedure: COLONOSCOPY WITH PROPOFOL;  Surgeon: [name], MD;  Location: [facility] ENDOSCOPY;  Service: Gastroenterology;  Laterality: N/A;   Laparoscopic sleeve gastrectomy  11/09/2017   NASAL SINUS SURGERY  1996 and 1999  x 2    Family History  Problem Relation Age of Onset   Fibromyalgia Mother    Diabetes Mother    Cancer Father        prostate   Diabetes Maternal Aunt    Diabetes Paternal Aunt    Scleroderma Maternal Grandmother    Heart disease Maternal Grandfather    Hypertension Neg Hx    Hyperlipidemia Neg Hx    Stroke Neg Hx     COPD Neg Hx     Social History   Socioeconomic History   Marital status: Married    Spouse name: Dominica Severin   Number of children: 3   Years of education: 14   Highest education level: Associate degree: occupational, Hotel manager, or vocational program  Occupational History   Not on file  Tobacco Use   Smoking status: Former    Packs/day: 1.00    Years: 20.00    Total pack years: 20.00    Types: Cigarettes    Quit date: 04/04/2003    Years since quitting: 19.0   Smokeless tobacco: Never  Vaping Use   Vaping Use: Never used  Substance and Sexual Activity   Alcohol use: Yes    Comment: Very Rare   Drug use: No   Sexual activity: Yes    Birth control/protection: Surgical    Comment: Total hystertomy   Other Topics Concern   Not on file  Social History Narrative   Not on file   Social Determinants of Health   Financial Resource Strain: Low Risk  (04/05/2022)   Overall Financial Resource Strain (CARDIA)    Difficulty of Paying Living Expenses: Not hard at all  Food Insecurity: No Food Insecurity (04/05/2022)   Hunger Vital Sign    Worried About Running Out of Food in the Last Year: Never true    Ran Out of Food in the Last Year: Never true  Transportation Needs: No Transportation Needs (04/05/2022)   PRAPARE - Hydrologist (Medical): No    Lack of Transportation (Non-Medical): No  Physical Activity: Insufficiently Active (04/05/2022)   Exercise Vital Sign    Days of Exercise per Week: 1 day    Minutes of Exercise per Session: 20 min  Stress: Stress Concern Present (04/05/2022)   [place]    Feeling of Stress : Very much  Social Connections: Moderately Isolated (04/05/2022)   Social Connection and Isolation Panel [NHANES]    Frequency of Communication with Friends and Family: More than three times a week    Frequency of Social Gatherings with Friends and Family: More than three times a week     Attends Religious Services: Never    Marine scientist or Organizations: No    Attends Archivist Meetings: Never    Marital Status: Married  Human resources officer Violence: Unknown (04/05/2022)   Humiliation, Afraid, Rape, and Kick questionnaire    Fear of Current or Ex-Partner: No    Emotionally Abused: No    Physically Abused: No    Sexually Abused: Not on file     Current Outpatient Medications:    albuterol (ACCUNEB) 0.63 MG/3ML nebulizer solution, USE 1 VIAL VIA NEBULIZER EVERY 4 HOURS AS NEEDED FOR WHEEZING, Disp: 75 mL, Rfl: 3   albuterol (VENTOLIN HFA) 108 (90 Base) MCG/ACT inhaler, Inhale 2 puffs into the lungs every 6 (six) hours as needed for wheezing or shortness of breath., Disp: 18 g, Rfl: 2  beclomethasone (QVAR) 40 MCG/ACT inhaler, Inhale 2 puffs into the lungs 2 (two) times daily., Disp: 1 each, Rfl: 1   calcium-vitamin D 250-100 MG-UNIT tablet, Take 1 tablet by mouth 2 (two) times daily., Disp: , Rfl:    clonazePAM (KLONOPIN) 0.5 MG tablet, Take 1 tablet (0.5 mg total) by mouth 2 (two) times daily as needed., Disp: 15 tablet, Rfl: 0   Erenumab-aooe (AIMOVIG) 70 MG/ML SOAJ, Inject 140 mg into the skin every 28 (twenty-eight) days., Disp: , Rfl:    gabapentin (NEURONTIN) 300 MG capsule, Take 300 mg by mouth 3 (three) times daily., Disp: , Rfl:    ibuprofen (ADVIL) 200 MG tablet, Take 1-2 tablets (200-400 mg total) by mouth every 6 (six) hours as needed., Disp: , Rfl:    ipratropium-albuterol (DUONEB) 0.5-2.5 (3) MG/3ML SOLN, Take 3 mLs by nebulization 3 (three) times daily as needed., Disp: 180 mL, Rfl: 1   Multiple Vitamins-Minerals (MULTIVITAMIN WITH MINERALS) tablet, Take 1 tablet by mouth daily., Disp: , Rfl:    ondansetron (ZOFRAN ODT) 8 MG disintegrating tablet, Take 1 tablet (8 mg total) by mouth every 8 (eight) hours as needed for nausea or vomiting., Disp: 20 tablet, Rfl: 0   pantoprazole (PROTONIX) 40 MG tablet, Take 40 mg by mouth daily., Disp: , Rfl:     SUMAtriptan (IMITREX) 50 MG tablet, Take 1-2 tablets (50-100 mg total) by mouth every 2 (two) hours as needed for migraine. May repeat in 2 hours if headache persists or recurs., Disp: 10 tablet, Rfl: 2   tiZANidine (ZANAFLEX) 4 MG tablet, Take 4 mg by mouth every 6 (six) hours as needed for muscle spasms., Disp: , Rfl:    valACYclovir (VALTREX) 1000 MG tablet, TAKE 2 TABLETS BY MOUTH AT FIRST SIGN OF OUTBREAK FOLLOWED BY 2 TABLETS MORE 12 HOURS LATER, Disp: 4 tablet, Rfl: 5   zolpidem (AMBIEN) 5 MG tablet, Take 1 tablet (5 mg total) by mouth at bedtime as needed for sleep., Disp: 30 tablet, Rfl: 0   predniSONE (STERAPRED UNI-PAK 21 TAB) 10 MG (21) TBPK tablet, Take as directed on package.  (60 mg po on day 1, 50 mg po on day 2...) (Patient not taking: Reported on 05/05/2022), Disp: 21 tablet, Rfl: 0  Allergies  Allergen Reactions   Naproxen Hives   Codeine Nausea And Vomiting    I personally reviewed active problem list, medication list, allergies, family history, social history, health maintenance with the patient/caregiver today.   ROS  Ten systems reviewed and is negative except as mentioned in HPI   Objective  Virtual encounter, vitals not obtained.  There is no height or weight on file to calculate BMI.  Physical Exam  Awake, alert and oriented    PHQ2/9:    05/05/2022    8:27 AM 04/05/2022    9:32 AM 04/05/2022    8:27 AM 01/04/2022    8:22 AM 04/01/2021    8:35 AM  Depression screen PHQ 2/9  Decreased Interest 0 0 0 0 0  Down, Depressed, Hopeless 0 0 0 0 0  PHQ - 2 Score 0 0 0 0 0  Altered sleeping 0 0   0  Tired, decreased energy 0 0   0  Change in appetite 0 0   0  Feeling bad or failure about yourself  0 0   0  Trouble concentrating 0 0   0  Moving slowly or fidgety/restless 0 0   0  Suicidal thoughts 0 0   0  PHQ-9 Score 0 0   0  Difficult doing work/chores     Not difficult at all   PHQ-2/9 Result is negative.      Fall Risk:    05/05/2022    8:27 AM  04/05/2022    8:26 AM 01/04/2022    8:22 AM 04/01/2021    8:35 AM 12/22/2020   10:54 AM  Fall Risk   Falls in the past year? 0 0 0 0 0  Number falls in past yr: 0 0 0 0 0  Injury with Fall? 0 0 0 0 0  Risk for fall due to : No Fall Risks      Follow up Falls prevention discussed Falls evaluation completed Falls evaluation completed       Assessment & Plan  1. Insomnia, unspecified type  - zolpidem (AMBIEN) 5 MG tablet; Take 1 tablet (5 mg total) by mouth at bedtime as needed for sleep.  Dispense: 30 tablet; Refill: 0 - QUEtiapine (SEROQUEL) 25 MG tablet; Take 1 tablet (25 mg total) by mouth at bedtime.  Dispense: 30 tablet; Refill: 0  2. Breast cancer screening by mammogram  - MM 3D SCREEN BREAST BILATERAL; Future   I discussed the assessment and treatment plan with the patient. The patient was provided an opportunity to ask questions and all were answered. The patient agreed with the plan and demonstrated an understanding of the instructions.  The patient was advised to call back or seek an in-person evaluation if the symptoms worsen or if the condition fails to improve as anticipated.  I provided 15  minutes of non-face-to-face time during this encounter.

## 2022-05-12 DIAGNOSIS — Z87891 Personal history of nicotine dependence: Secondary | ICD-10-CM | POA: Diagnosis not present

## 2022-05-12 DIAGNOSIS — J45909 Unspecified asthma, uncomplicated: Secondary | ICD-10-CM | POA: Diagnosis not present

## 2022-05-12 DIAGNOSIS — Z7951 Long term (current) use of inhaled steroids: Secondary | ICD-10-CM | POA: Diagnosis not present

## 2022-05-12 DIAGNOSIS — Z9989 Dependence on other enabling machines and devices: Secondary | ICD-10-CM | POA: Diagnosis not present

## 2022-05-12 DIAGNOSIS — Z79899 Other long term (current) drug therapy: Secondary | ICD-10-CM | POA: Diagnosis not present

## 2022-05-12 DIAGNOSIS — H04552 Acquired stenosis of left nasolacrimal duct: Secondary | ICD-10-CM | POA: Diagnosis not present

## 2022-05-16 ENCOUNTER — Ambulatory Visit: Payer: BC Managed Care – PPO | Admitting: Family Medicine

## 2022-05-30 DIAGNOSIS — F5101 Primary insomnia: Secondary | ICD-10-CM | POA: Diagnosis not present

## 2022-05-30 DIAGNOSIS — M5481 Occipital neuralgia: Secondary | ICD-10-CM | POA: Diagnosis not present

## 2022-05-30 DIAGNOSIS — G43719 Chronic migraine without aura, intractable, without status migrainosus: Secondary | ICD-10-CM | POA: Diagnosis not present

## 2022-05-30 DIAGNOSIS — G629 Polyneuropathy, unspecified: Secondary | ICD-10-CM | POA: Diagnosis not present

## 2022-05-30 DIAGNOSIS — G4733 Obstructive sleep apnea (adult) (pediatric): Secondary | ICD-10-CM | POA: Diagnosis not present

## 2022-06-01 DIAGNOSIS — G4733 Obstructive sleep apnea (adult) (pediatric): Secondary | ICD-10-CM | POA: Diagnosis not present

## 2022-06-02 DIAGNOSIS — Z1231 Encounter for screening mammogram for malignant neoplasm of breast: Secondary | ICD-10-CM | POA: Diagnosis not present

## 2022-06-02 DIAGNOSIS — Z006 Encounter for examination for normal comparison and control in clinical research program: Secondary | ICD-10-CM | POA: Diagnosis not present

## 2022-06-02 LAB — HM MAMMOGRAPHY

## 2022-06-15 DIAGNOSIS — Z87891 Personal history of nicotine dependence: Secondary | ICD-10-CM | POA: Diagnosis not present

## 2022-06-15 DIAGNOSIS — R110 Nausea: Secondary | ICD-10-CM | POA: Diagnosis not present

## 2022-06-15 DIAGNOSIS — R1011 Right upper quadrant pain: Secondary | ICD-10-CM | POA: Diagnosis not present

## 2022-06-15 DIAGNOSIS — Z7951 Long term (current) use of inhaled steroids: Secondary | ICD-10-CM | POA: Diagnosis not present

## 2022-06-15 DIAGNOSIS — J45909 Unspecified asthma, uncomplicated: Secondary | ICD-10-CM | POA: Diagnosis not present

## 2022-06-15 DIAGNOSIS — G4733 Obstructive sleep apnea (adult) (pediatric): Secondary | ICD-10-CM | POA: Diagnosis not present

## 2022-06-15 DIAGNOSIS — K219 Gastro-esophageal reflux disease without esophagitis: Secondary | ICD-10-CM | POA: Diagnosis not present

## 2022-06-15 DIAGNOSIS — Z885 Allergy status to narcotic agent status: Secondary | ICD-10-CM | POA: Diagnosis not present

## 2022-06-27 ENCOUNTER — Other Ambulatory Visit: Payer: Self-pay | Admitting: Family Medicine

## 2022-06-27 DIAGNOSIS — G4700 Insomnia, unspecified: Secondary | ICD-10-CM

## 2022-06-28 ENCOUNTER — Other Ambulatory Visit: Payer: Self-pay | Admitting: Family Medicine

## 2022-07-02 DIAGNOSIS — G4733 Obstructive sleep apnea (adult) (pediatric): Secondary | ICD-10-CM | POA: Diagnosis not present

## 2022-07-09 ENCOUNTER — Other Ambulatory Visit: Payer: Self-pay | Admitting: Nurse Practitioner

## 2022-07-11 NOTE — Telephone Encounter (Signed)
Requested medication (s) are due for refill today: Yes  Requested medication (s) are on the active medication list: Yes  Last refill:  05/24/21  Future visit scheduled: Yes  Notes to clinic:  Prescription has expired.    Requested Prescriptions  Pending Prescriptions Disp Refills   valACYclovir (VALTREX) 1000 MG tablet [Pharmacy Med Name: VALACYCLOVIR 1GM TABLETS] 4 tablet 5    Sig: TAKE 2 TABLETS BY MOUTH AT FIRST SIGN OF OUTBREAK FOLLOWED BY 2 TABLETS MORE 12 HOURS LATER     Antimicrobials:  Antiviral Agents - Anti-Herpetic Passed - 07/09/2022  1:47 PM      Passed - Valid encounter within last 12 months    Recent Outpatient Visits           2 months ago Insomnia, unspecified type   [facility] Alba Cory, MD   3 months ago Annual physical exam   [facility] Della Goo F, FNP   6 months ago Vasomotor symptoms due to menopause   [facility] Berniece Salines, FNP   1 year ago Annual physical exam   [facility] Berniece Salines, FNP   1 year ago Dysthymic disorder   [facility] Caro Laroche, DO       Future Appointments             In 3 weeks Danelle Berry, PA-C [facility]   In 9 months Danelle Berry, PA-C [facility], [facility]

## 2022-07-13 DIAGNOSIS — Z6841 Body Mass Index (BMI) 40.0 and over, adult: Secondary | ICD-10-CM | POA: Diagnosis not present

## 2022-07-31 DIAGNOSIS — F509 Eating disorder, unspecified: Secondary | ICD-10-CM | POA: Diagnosis not present

## 2022-08-01 DIAGNOSIS — G4733 Obstructive sleep apnea (adult) (pediatric): Secondary | ICD-10-CM | POA: Diagnosis not present

## 2022-08-04 ENCOUNTER — Ambulatory Visit: Payer: BC Managed Care – PPO | Admitting: Family Medicine

## 2022-08-21 DIAGNOSIS — F509 Eating disorder, unspecified: Secondary | ICD-10-CM | POA: Diagnosis not present

## 2022-08-23 ENCOUNTER — Ambulatory Visit: Payer: BC Managed Care – PPO | Admitting: Family Medicine

## 2022-08-31 DIAGNOSIS — G4733 Obstructive sleep apnea (adult) (pediatric): Secondary | ICD-10-CM | POA: Diagnosis not present

## 2022-09-05 ENCOUNTER — Other Ambulatory Visit: Payer: Self-pay | Admitting: Family Medicine

## 2022-09-05 DIAGNOSIS — G4700 Insomnia, unspecified: Secondary | ICD-10-CM

## 2022-09-11 DIAGNOSIS — H04552 Acquired stenosis of left nasolacrimal duct: Secondary | ICD-10-CM | POA: Diagnosis not present

## 2022-09-18 ENCOUNTER — Ambulatory Visit: Payer: BC Managed Care – PPO | Admitting: Family Medicine

## 2022-09-27 DIAGNOSIS — F419 Anxiety disorder, unspecified: Secondary | ICD-10-CM | POA: Diagnosis not present

## 2022-09-27 DIAGNOSIS — F509 Eating disorder, unspecified: Secondary | ICD-10-CM | POA: Diagnosis not present

## 2022-10-01 DIAGNOSIS — G4733 Obstructive sleep apnea (adult) (pediatric): Secondary | ICD-10-CM | POA: Diagnosis not present

## 2022-10-04 DIAGNOSIS — K5903 Drug induced constipation: Secondary | ICD-10-CM | POA: Diagnosis not present

## 2022-10-04 DIAGNOSIS — R1011 Right upper quadrant pain: Secondary | ICD-10-CM | POA: Diagnosis not present

## 2022-10-04 DIAGNOSIS — Z9884 Bariatric surgery status: Secondary | ICD-10-CM | POA: Diagnosis not present

## 2022-10-04 DIAGNOSIS — R109 Unspecified abdominal pain: Secondary | ICD-10-CM | POA: Diagnosis not present

## 2022-10-04 DIAGNOSIS — Q433 Congenital malformations of intestinal fixation: Secondary | ICD-10-CM | POA: Diagnosis not present

## 2022-10-04 DIAGNOSIS — Z87891 Personal history of nicotine dependence: Secondary | ICD-10-CM | POA: Diagnosis not present

## 2022-10-24 DIAGNOSIS — F411 Generalized anxiety disorder: Secondary | ICD-10-CM | POA: Diagnosis not present

## 2022-10-24 DIAGNOSIS — G4700 Insomnia, unspecified: Secondary | ICD-10-CM | POA: Diagnosis not present

## 2022-11-01 DIAGNOSIS — Z6841 Body Mass Index (BMI) 40.0 and over, adult: Secondary | ICD-10-CM | POA: Diagnosis not present

## 2022-11-01 DIAGNOSIS — G4733 Obstructive sleep apnea (adult) (pediatric): Secondary | ICD-10-CM | POA: Diagnosis not present

## 2022-11-03 ENCOUNTER — Ambulatory Visit: Payer: BC Managed Care – PPO | Admitting: Family Medicine

## 2022-11-03 DIAGNOSIS — F419 Anxiety disorder, unspecified: Secondary | ICD-10-CM | POA: Diagnosis not present

## 2022-11-21 DIAGNOSIS — G4700 Insomnia, unspecified: Secondary | ICD-10-CM | POA: Diagnosis not present

## 2022-12-02 DIAGNOSIS — G4733 Obstructive sleep apnea (adult) (pediatric): Secondary | ICD-10-CM | POA: Diagnosis not present

## 2022-12-14 DIAGNOSIS — L700 Acne vulgaris: Secondary | ICD-10-CM | POA: Diagnosis not present

## 2022-12-18 DIAGNOSIS — F509 Eating disorder, unspecified: Secondary | ICD-10-CM | POA: Diagnosis not present

## 2022-12-18 DIAGNOSIS — F419 Anxiety disorder, unspecified: Secondary | ICD-10-CM | POA: Diagnosis not present

## 2023-01-01 DIAGNOSIS — G4733 Obstructive sleep apnea (adult) (pediatric): Secondary | ICD-10-CM | POA: Diagnosis not present

## 2023-01-01 DIAGNOSIS — J029 Acute pharyngitis, unspecified: Secondary | ICD-10-CM | POA: Diagnosis not present

## 2023-01-17 DIAGNOSIS — G4700 Insomnia, unspecified: Secondary | ICD-10-CM | POA: Diagnosis not present

## 2023-01-17 DIAGNOSIS — F411 Generalized anxiety disorder: Secondary | ICD-10-CM | POA: Diagnosis not present

## 2023-01-22 DIAGNOSIS — F419 Anxiety disorder, unspecified: Secondary | ICD-10-CM | POA: Diagnosis not present

## 2023-01-26 DIAGNOSIS — Z9884 Bariatric surgery status: Secondary | ICD-10-CM | POA: Diagnosis not present

## 2023-01-26 DIAGNOSIS — K912 Postsurgical malabsorption, not elsewhere classified: Secondary | ICD-10-CM | POA: Diagnosis not present

## 2023-01-26 DIAGNOSIS — Z713 Dietary counseling and surveillance: Secondary | ICD-10-CM | POA: Diagnosis not present

## 2023-01-31 DIAGNOSIS — G4733 Obstructive sleep apnea (adult) (pediatric): Secondary | ICD-10-CM | POA: Diagnosis not present

## 2023-02-01 DIAGNOSIS — G4733 Obstructive sleep apnea (adult) (pediatric): Secondary | ICD-10-CM | POA: Diagnosis not present

## 2023-02-08 DIAGNOSIS — K912 Postsurgical malabsorption, not elsewhere classified: Secondary | ICD-10-CM | POA: Diagnosis not present

## 2023-02-08 DIAGNOSIS — Z9884 Bariatric surgery status: Secondary | ICD-10-CM | POA: Diagnosis not present

## 2023-02-08 DIAGNOSIS — Z713 Dietary counseling and surveillance: Secondary | ICD-10-CM | POA: Diagnosis not present

## 2023-02-13 DIAGNOSIS — F411 Generalized anxiety disorder: Secondary | ICD-10-CM | POA: Diagnosis not present

## 2023-03-03 DIAGNOSIS — G4733 Obstructive sleep apnea (adult) (pediatric): Secondary | ICD-10-CM | POA: Diagnosis not present

## 2023-03-06 ENCOUNTER — Other Ambulatory Visit: Payer: Self-pay

## 2023-03-06 ENCOUNTER — Encounter: Payer: Self-pay | Admitting: Nurse Practitioner

## 2023-03-08 DIAGNOSIS — J0100 Acute maxillary sinusitis, unspecified: Secondary | ICD-10-CM | POA: Diagnosis not present

## 2023-03-13 DIAGNOSIS — F419 Anxiety disorder, unspecified: Secondary | ICD-10-CM | POA: Diagnosis not present

## 2023-03-14 DIAGNOSIS — Z79899 Other long term (current) drug therapy: Secondary | ICD-10-CM | POA: Diagnosis not present

## 2023-03-14 DIAGNOSIS — Z9884 Bariatric surgery status: Secondary | ICD-10-CM | POA: Diagnosis not present

## 2023-03-14 DIAGNOSIS — Z131 Encounter for screening for diabetes mellitus: Secondary | ICD-10-CM | POA: Diagnosis not present

## 2023-03-14 DIAGNOSIS — K912 Postsurgical malabsorption, not elsewhere classified: Secondary | ICD-10-CM | POA: Diagnosis not present

## 2023-03-14 DIAGNOSIS — Z713 Dietary counseling and surveillance: Secondary | ICD-10-CM | POA: Diagnosis not present

## 2023-03-17 DIAGNOSIS — G4700 Insomnia, unspecified: Secondary | ICD-10-CM | POA: Diagnosis not present

## 2023-03-26 ENCOUNTER — Telehealth: Payer: BC Managed Care – PPO | Admitting: Family Medicine

## 2023-03-26 DIAGNOSIS — B9689 Other specified bacterial agents as the cause of diseases classified elsewhere: Secondary | ICD-10-CM

## 2023-03-26 DIAGNOSIS — J019 Acute sinusitis, unspecified: Secondary | ICD-10-CM

## 2023-03-26 MED ORDER — AMOXICILLIN-POT CLAVULANATE 875-125 MG PO TABS: 1.0000 | ORAL_TABLET | Freq: Two times a day (BID) | ORAL | 0 refills | Status: DC

## 2023-03-26 NOTE — Progress Notes (Signed)
Virtual Visit Consent   LOUANNE VILLANOVA, you are scheduled for a virtual visit with a [facility] provider today. Just as with appointments in the office, your consent must be obtained to participate. Your consent will be active for this visit and any virtual visit you may have with one of our providers in the next 365 days. If you have a MyChart account, a copy of this consent can be sent to you electronically.  As this is a virtual visit, video technology does not allow for your provider to perform a traditional examination. This may limit your provider's ability to fully assess your condition. If your provider identifies any concerns that need to be evaluated in person or the need to arrange testing (such as labs, EKG, etc.), we will make arrangements to do so. Although advances in technology are sophisticated, we cannot ensure that it will always work on either your end or our end. If the connection with a video visit is poor, the visit may have to be switched to a telephone visit. With either a video or telephone visit, we are not always able to ensure that we have a secure connection.  By engaging in this virtual visit, you consent to the provision of healthcare and authorize for your insurance to be billed (if applicable) for the services provided during this visit. Depending on your insurance coverage, you may receive a charge related to this service.  I need to obtain your verbal consent now. Are you willing to proceed with your visit today? KAIL LEVENE has provided verbal consent on 03/26/2023 for a virtual visit (video or telephone). Georgana Curio, FNP  Date: 03/26/2023 6:22 PM  Virtual Visit via Video Note   I, Georgana Curio, connected with  KASSANDRE WATTLEY  ([id number], October 27, 1970) on 03/26/23 at  6:15 PM EST by a video-enabled telemedicine application and verified that I am speaking with the correct person using two identifiers.  Location: Patient: Virtual Visit Location Patient:  Home Provider: Virtual Visit Location Provider: Home Office   I discussed the limitations of evaluation and management by telemedicine and the availability of in person appointments. The patient expressed understanding and agreed to proceed.    History of Present Illness: Jamie Hardin is a 52 y.o. who identifies as a female who was assigned female at birth, and is being seen today for sinus pressure and pain with bright green nasal drainage for 3 weeks worsening. No fever, cough, wheezing or SOB. 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   Newly recognized heart murmur 08/29/2017   PAC (premature atrial contraction) 08/29/2017   DDD (degenerative disc disease), cervical 07/11/2017   Pain in joint involving ankle and foot 07/11/2017   Low back pain 07/11/2017   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   Influenza vaccination declined by patient 01/15/2017   Contusion of right knee 11/15/2016   Subluxation of shoulder joint 11/15/2016   Abnormal mammogram of both breasts 02/04/2016   History of pneumonia as indication for 23-polyvalent pneumococcal polysaccharide vaccine 01/14/2016   Preventative health care 01/14/2016   Facial pain,  atypical 04/27/2015   Eustachian tube dysfunction 04/27/2015   Neoplasm of skin of neck 04/27/2015   Abnormal thyroid function test 02/15/2015   Rash 02/15/2015   Breast cancer screening 01/12/2015   Elevated blood-pressure reading without diagnosis of hypertension 01/12/2015   GAD (generalized anxiety disorder)  01/12/2015   OSA on CPAP 01/12/2015   Allergy    Asthma    Morbid obesity ([facility])    Dysthymic disorder    GERD (gastroesophageal reflux disease)    Fibromyalgia 07/22/2014   Intractable chronic migraine without aura and without status migrainosus 07/22/2014   Morbid obesity with BMI of 50.0-59.9, adult ([facility]) 07/22/2014    Allergies:  Allergies  Allergen Reactions   Naproxen Hives   Codeine Nausea And Vomiting   Medications:  Current Outpatient Medications:    albuterol (ACCUNEB) 0.63 MG/3ML nebulizer solution, USE 1 VIAL VIA NEBULIZER EVERY 4 HOURS AS NEEDED FOR WHEEZING, Disp: 75 mL, Rfl: 3   albuterol (VENTOLIN HFA) 108 (90 Base) MCG/ACT inhaler, Inhale 2 puffs into the lungs every 6 (six) hours as needed for wheezing or shortness of breath., Disp: 18 g, Rfl: 2   beclomethasone (QVAR) 40 MCG/ACT inhaler, Inhale 2 puffs into the lungs 2 (two) times daily., Disp: 1 each, Rfl: 1   calcium-vitamin D 250-100 MG-UNIT tablet, Take 1 tablet by mouth 2 (two) times daily., Disp: , Rfl:    clonazePAM (KLONOPIN) 0.5 MG tablet, Take 1 tablet (0.5 mg total) by mouth 2 (two) times daily as needed., Disp: 15 tablet, Rfl: 0   Erenumab-aooe (AIMOVIG) 70 MG/ML SOAJ, Inject 140 mg into the skin every 28 (twenty-eight) days., Disp: , Rfl:    gabapentin (NEURONTIN) 300 MG capsule, Take 300 mg by mouth 3 (three) times daily., Disp: , Rfl:    ibuprofen (ADVIL) 200 MG tablet, Take 1-2 tablets (200-400 mg total) by mouth every 6 (six) hours as needed., Disp: , Rfl:    ipratropium-albuterol (DUONEB) 0.5-2.5 (3) MG/3ML SOLN, Take 3 mLs by nebulization 3 (three) times daily as needed., Disp: 180 mL, Rfl: 1   Multiple Vitamins-Minerals (MULTIVITAMIN WITH MINERALS) tablet, Take 1 tablet by mouth daily., Disp: , Rfl:    ondansetron (ZOFRAN ODT) 8 MG disintegrating tablet, Take 1 tablet (8 mg total) by mouth every 8 (eight) hours as needed for nausea or vomiting., Disp: 20 tablet, Rfl: 0   pantoprazole (PROTONIX) 40  MG tablet, Take 40 mg by mouth daily., Disp: , Rfl:    SUMAtriptan (IMITREX) 50 MG tablet, Take 1-2 tablets (50-100 mg total) by mouth every 2 (two) hours as needed for migraine. May repeat in 2 hours if headache persists or recurs., Disp: 10 tablet, Rfl: 2   tiZANidine (ZANAFLEX) 4 MG tablet, Take 4 mg by mouth every 6 (six) hours as needed for muscle spasms., Disp: , Rfl:    valACYclovir (VALTREX) 1000 MG tablet, TAKE 2 TABLETS BY MOUTH AT FIRST SIGN OF OUTBREAK FOLLOWED BY 2 TABLETS MORE 12 HOURS LATER, Disp: 4 tablet, Rfl: 5   zolpidem (AMBIEN) 5 MG tablet, TAKE 1 TABLET(5 MG) BY MOUTH AT BEDTIME AS NEEDED FOR SLEEP, Disp: 30 tablet, Rfl: 1  Observations/Objective: Patient is well-developed, well-nourished in no acute distress.  Resting comfortably  at home.  Head is normocephalic, atraumatic.  No labored breathing.  Speech is clear and coherent with logical content.  Patient is alert and oriented at baseline.   Assessment and Plan: 1. Acute bacterial sinusitis (Primary)  Increase fluids, humidifier at night, ibuprofen as directed, flonase,  UC of sx worsen.   Follow Up Instructions: I discussed the assessment and treatment plan with the patient. The patient was provided an opportunity to ask questions and all were answered. The patient agreed with the plan and demonstrated an understanding of the instructions.  A copy of instructions were sent to the patient via MyChart unless otherwise noted below.     The patient was advised to call back or seek an in-person evaluation if the symptoms worsen or if the condition fails to improve as anticipated.    Georgana Curio, FNP

## 2023-03-26 NOTE — Patient Instructions (Signed)

## 2023-04-02 DIAGNOSIS — G4733 Obstructive sleep apnea (adult) (pediatric): Secondary | ICD-10-CM | POA: Diagnosis not present

## 2023-04-05 DIAGNOSIS — F5104 Psychophysiologic insomnia: Secondary | ICD-10-CM | POA: Diagnosis not present

## 2023-04-05 DIAGNOSIS — F411 Generalized anxiety disorder: Secondary | ICD-10-CM | POA: Diagnosis not present

## 2023-04-09 ENCOUNTER — Encounter: Payer: BC Managed Care – PPO | Admitting: Family Medicine

## 2023-04-09 ENCOUNTER — Encounter: Payer: BC Managed Care – PPO | Admitting: Nurse Practitioner

## 2023-04-15 ENCOUNTER — Telehealth: Payer: BC Managed Care – PPO | Admitting: Family Medicine

## 2023-04-15 DIAGNOSIS — J019 Acute sinusitis, unspecified: Secondary | ICD-10-CM

## 2023-04-15 DIAGNOSIS — B9689 Other specified bacterial agents as the cause of diseases classified elsewhere: Secondary | ICD-10-CM

## 2023-04-15 MED ORDER — LEVOFLOXACIN 500 MG PO TABS: 500.0000 mg | ORAL_TABLET | Freq: Every day | ORAL | 0 refills | Status: AC

## 2023-04-15 MED ORDER — FLUCONAZOLE 150 MG PO TABS: 150.0000 mg | ORAL_TABLET | Freq: Once | ORAL | 0 refills | Status: AC

## 2023-04-15 MED ORDER — PREDNISONE 20 MG PO TABS: 20.0000 mg | ORAL_TABLET | Freq: Two times a day (BID) | ORAL | 0 refills | Status: AC

## 2023-04-15 NOTE — Progress Notes (Signed)
 Virtual Visit Consent   Jamie Hardin, you are scheduled for a virtual visit with a [facility] provider today. Just as with appointments in the office, your consent must be obtained to participate. Your consent will be active for this visit and any virtual visit you may have with one of our providers in the next 365 days. If you have a MyChart account, a copy of this consent can be sent to you electronically.  As this is a virtual visit, video technology does not allow for your provider to perform a traditional examination. This may limit your provider's ability to fully assess your condition. If your provider identifies any concerns that need to be evaluated in person or the need to arrange testing (such as labs, EKG, etc.), we will make arrangements to do so. Although advances in technology are sophisticated, we cannot ensure that it will always work on either your end or our end. If the connection with a video visit is poor, the visit may have to be switched to a telephone visit. With either a video or telephone visit, we are not always able to ensure that we have a secure connection.  By engaging in this virtual visit, you consent to the provision of healthcare and authorize for your insurance to be billed (if applicable) for the services provided during this visit. Depending on your insurance coverage, you may receive a charge related to this service.  I need to obtain your verbal consent now. Are you willing to proceed with your visit today? Jamie Hardin has provided verbal consent on 04/15/2023 for a virtual visit (video or telephone). Jamie Lamp, FNP  Date: 04/15/2023 12:38 PM  Virtual Visit via Video Note   I, Jamie Hardin, connected with  Jamie Hardin  ([id number], 06-27-1970) on 04/15/23 at 12:30 PM EST by a video-enabled telemedicine application and verified that I am speaking with the correct person using two identifiers.  Location: Patient: Virtual Visit Location Patient:  Home Provider: Virtual Visit Location Provider: Home Office   I discussed the limitations of evaluation and management by telemedicine and the availability of in person appointments. The patient expressed understanding and agreed to proceed.    History of Present Illness: Jamie Hardin is a 53 y.o. who identifies as a female who was assigned female at birth, and is being seen today for sinus pressure and pain, green mucus, no fever. Previously had a sinus infection and sx resolved but have now returned. 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   Newly recognized heart murmur 08/29/2017   PAC (premature atrial contraction) 08/29/2017   DDD (degenerative disc disease), cervical 07/11/2017   Pain in joint involving ankle and foot 07/11/2017   Low back pain 07/11/2017   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   Influenza vaccination declined by patient 01/15/2017   Contusion of right knee 11/15/2016   Subluxation of shoulder joint 11/15/2016   Abnormal mammogram of both breasts 02/04/2016   History of pneumonia as indication for 23-polyvalent pneumococcal polysaccharide vaccine 01/14/2016   Preventative health care 01/14/2016   Facial pain,  atypical 04/27/2015   Eustachian tube dysfunction 04/27/2015   Neoplasm of skin of neck 04/27/2015   Abnormal thyroid  function test 02/15/2015   Rash 02/15/2015   Breast cancer screening 01/12/2015   Elevated blood-pressure reading without diagnosis of hypertension 01/12/2015   GAD (generalized anxiety disorder)  01/12/2015   OSA on CPAP 01/12/2015   Allergy    Asthma    Morbid obesity (HCC)    Dysthymic disorder    GERD (gastroesophageal reflux disease)    Fibromyalgia 07/22/2014   Intractable chronic migraine without aura and without status migrainosus 07/22/2014   Morbid obesity with BMI of 50.0-59.9, adult (HCC) 07/22/2014    Allergies:  Allergies  Allergen Reactions   Naproxen Hives   Codeine Nausea And Vomiting   Medications:  Current Outpatient Medications:    fluconazole  (DIFLUCAN ) 150 MG tablet, Take 1 tablet (150 mg total) by mouth once for 1 dose., Disp: 1 tablet, Rfl: 0   levofloxacin  (LEVAQUIN ) 500 MG tablet, Take 1 tablet (500 mg total) by mouth daily for 7 days., Disp: 7 tablet, Rfl: 0   predniSONE  (DELTASONE ) 20 MG tablet, Take 1 tablet (20 mg total) by mouth 2 (two) times daily with a meal for 5 days., Disp: 10 tablet, Rfl: 0   albuterol  (ACCUNEB ) 0.63 MG/3ML nebulizer solution, USE 1 VIAL VIA NEBULIZER EVERY 4 HOURS AS NEEDED FOR WHEEZING, Disp: 75 mL, Rfl: 3   albuterol  (VENTOLIN  HFA) 108 (90 Base) MCG/ACT inhaler, Inhale 2 puffs into the lungs every 6 (six) hours as needed for wheezing or shortness of breath., Disp: 18 g, Rfl: 2   amoxicillin -clavulanate (AUGMENTIN ) 875-125 MG tablet, Take 1 tablet by mouth 2 (two) times daily., Disp: 20 tablet, Rfl: 0   beclomethasone (QVAR) 40 MCG/ACT inhaler, Inhale 2 puffs into the lungs 2 (two) times daily., Disp: 1 each, Rfl: 1   calcium-vitamin D  250-100 MG-UNIT tablet, Take 1 tablet by mouth 2 (two) times daily., Disp: , Rfl:    clonazePAM  (KLONOPIN ) 0.5 MG tablet, Take 1 tablet (0.5 mg total) by mouth 2 (two) times daily as needed., Disp: 15 tablet, Rfl: 0   Erenumab-aooe (AIMOVIG) 70 MG/ML SOAJ, Inject 140 mg into the skin every 28 (twenty-eight) days., Disp: , Rfl:    gabapentin  (NEURONTIN ) 300 MG capsule, Take 300 mg by mouth 3 (three) times daily., Disp: , Rfl:    ibuprofen  (ADVIL ) 200 MG tablet, Take 1-2 tablets (200-400 mg  total) by mouth every 6 (six) hours as needed., Disp: , Rfl:    ipratropium-albuterol  (DUONEB) 0.5-2.5 (3) MG/3ML SOLN, Take 3 mLs by nebulization 3 (three) times daily as needed., Disp: 180 mL, Rfl: 1   Multiple Vitamins-Minerals (MULTIVITAMIN WITH MINERALS) tablet, Take 1 tablet by mouth daily., Disp: , Rfl:    ondansetron  (ZOFRAN  ODT) 8 MG disintegrating tablet, Take 1 tablet (8 mg total) by mouth every 8 (eight) hours as needed for nausea or vomiting., Disp: 20 tablet, Rfl: 0   pantoprazole  (PROTONIX ) 40 MG tablet, Take 40 mg by mouth daily., Disp: , Rfl:    SUMAtriptan  (IMITREX ) 50 MG tablet, Take 1-2 tablets (50-100 mg total) by mouth every 2 (two) hours as needed for migraine. May repeat in 2 hours if headache persists or recurs., Disp: 10 tablet, Rfl: 2   tiZANidine (ZANAFLEX) 4 MG tablet, Take 4 mg by mouth every 6 (six) hours as needed for muscle spasms., Disp: , Rfl:    valACYclovir  (VALTREX ) 1000 MG tablet, TAKE 2 TABLETS BY MOUTH AT FIRST SIGN OF OUTBREAK  FOLLOWED BY 2 TABLETS MORE 12 HOURS LATER, Disp: 4 tablet, Rfl: 5   zolpidem  (AMBIEN ) 5 MG tablet, TAKE 1 TABLET(5 MG) BY MOUTH AT BEDTIME AS NEEDED FOR SLEEP, Disp: 30 tablet, Rfl: 1  Observations/Objective: Patient is well-developed, well-nourished in no acute distress.  Resting comfortably  at home.  Head is normocephalic, atraumatic.  No labored breathing.  Speech is clear and coherent with logical content.  Patient is alert and oriented at baseline.    Assessment and Plan: 1. Acute bacterial sinusitis (Primary)  Increase fluids, humidifier, flonase, tylenol , allergy meds, UC if sx persist or worsen.   Follow Up Instructions: I discussed the assessment and treatment plan with the patient. The patient was provided an opportunity to ask questions and all were answered. The patient agreed with the plan and demonstrated an understanding of the instructions.  A copy of instructions were sent to the patient via MyChart unless  otherwise noted below.     The patient was advised to call back or seek an in-person evaluation if the symptoms worsen or if the condition fails to improve as anticipated.    Raheel Kunkle, FNP

## 2023-04-15 NOTE — Patient Instructions (Signed)

## 2023-04-24 DIAGNOSIS — F411 Generalized anxiety disorder: Secondary | ICD-10-CM | POA: Diagnosis not present

## 2023-04-24 DIAGNOSIS — F5104 Psychophysiologic insomnia: Secondary | ICD-10-CM | POA: Diagnosis not present

## 2023-04-24 DIAGNOSIS — F331 Major depressive disorder, recurrent, moderate: Secondary | ICD-10-CM | POA: Diagnosis not present

## 2023-04-25 NOTE — Progress Notes (Unsigned)
 Marland Kitchen

## 2023-04-26 ENCOUNTER — Ambulatory Visit: Payer: BC Managed Care – PPO | Admitting: Family Medicine

## 2023-04-26 VITALS — BP 132/70 | HR 74 | Resp 16 | Ht 65.0 in | Wt 273.0 lb

## 2023-04-26 DIAGNOSIS — Z780 Asymptomatic menopausal state: Secondary | ICD-10-CM

## 2023-04-26 DIAGNOSIS — N952 Postmenopausal atrophic vaginitis: Secondary | ICD-10-CM | POA: Diagnosis not present

## 2023-04-26 DIAGNOSIS — Z6841 Body Mass Index (BMI) 40.0 and over, adult: Secondary | ICD-10-CM

## 2023-04-26 DIAGNOSIS — Z0001 Encounter for general adult medical examination with abnormal findings: Secondary | ICD-10-CM | POA: Diagnosis not present

## 2023-04-26 DIAGNOSIS — Z1231 Encounter for screening mammogram for malignant neoplasm of breast: Secondary | ICD-10-CM

## 2023-04-26 DIAGNOSIS — E66813 Obesity, class 3: Secondary | ICD-10-CM

## 2023-04-26 DIAGNOSIS — G4733 Obstructive sleep apnea (adult) (pediatric): Secondary | ICD-10-CM | POA: Diagnosis not present

## 2023-04-26 DIAGNOSIS — Z23 Encounter for immunization: Secondary | ICD-10-CM

## 2023-04-26 DIAGNOSIS — E8940 Asymptomatic postprocedural ovarian failure: Secondary | ICD-10-CM

## 2023-04-26 DIAGNOSIS — Z1211 Encounter for screening for malignant neoplasm of colon: Secondary | ICD-10-CM

## 2023-04-26 DIAGNOSIS — Z0000 Encounter for general adult medical examination without abnormal findings: Secondary | ICD-10-CM

## 2023-04-26 MED ORDER — ESTRADIOL 0.1 MG/GM VA CREA: 1.0000 | TOPICAL_CREAM | Freq: Every day | VAGINAL | 12 refills | Status: AC

## 2023-04-26 NOTE — Patient Instructions (Signed)
Health Maintenance  Topic Date Due   Zoster (Shingles) Vaccine (1 of 2) Never done   Pneumococcal Vaccination (2 of 2 - PCV) 01/13/2017   COVID-19 Vaccine (3 - Moderna risk series) 05/07/2020   Colon Cancer Screening  05/30/2022   Flu Shot  11/02/2022   Mammogram  06/02/2023   DTaP/Tdap/Td vaccine (2 - Tdap) 09/16/2026   Hepatitis C Screening  Completed   HIV Screening  Completed   HPV Vaccine  Aged Out   Preventive Care 38-51 Years Old, Female Preventive care refers to lifestyle choices and visits with your health care provider that can promote health and wellness. Preventive care visits are also called wellness exams. What can I expect for my preventive care visit? Counseling Your health care provider may ask you questions about your: Medical history, including: Past medical problems. Family medical history. Pregnancy history. Current health, including: Menstrual cycle. Method of birth control. Emotional well-being. Home life and relationship well-being. Sexual activity and sexual health. Lifestyle, including: Alcohol, nicotine or tobacco, and drug use. Access to firearms. Diet, exercise, and sleep habits. Work and work Astronomer. Sunscreen use. Safety issues such as seatbelt and bike helmet use. Physical exam Your health care provider will check your: Height and weight. These may be used to calculate your BMI (body mass index). BMI is a measurement that tells if you are at a healthy weight. Waist circumference. This measures the distance around your waistline. This measurement also tells if you are at a healthy weight and may help predict your risk of certain diseases, such as type 2 diabetes and high blood pressure. Heart rate and blood pressure. Body temperature. Skin for abnormal spots. What immunizations do I need?  Vaccines are usually given at various ages, according to a schedule. Your health care provider will recommend vaccines for you based on your age,  medical history, and lifestyle or other factors, such as travel or where you work. What tests do I need? Screening Your health care provider may recommend screening tests for certain conditions. This may include: Lipid and cholesterol levels. Diabetes screening. This is done by checking your blood sugar (glucose) after you have not eaten for a while (fasting). Pelvic exam and Pap test. Hepatitis B test. Hepatitis C test. HIV (human immunodeficiency virus) test. STI (sexually transmitted infection) testing, if you are at risk. Lung cancer screening. Colorectal cancer screening. Mammogram. Talk with your health care provider about when you should start having regular mammograms. This may depend on whether you have a family history of breast cancer. BRCA-related cancer screening. This may be done if you have a family history of breast, ovarian, tubal, or peritoneal cancers. Bone density scan. This is done to screen for osteoporosis. Talk with your health care provider about your test results, treatment options, and if necessary, the need for more tests. Follow these instructions at home: Eating and drinking  Eat a diet that includes fresh fruits and vegetables, whole grains, lean protein, and low-fat dairy products. Take vitamin and mineral supplements as recommended by your health care provider. Do not drink alcohol if: Your health care provider tells you not to drink. You are pregnant, may be pregnant, or are planning to become pregnant. If you drink alcohol: Limit how much you have to 0-1 drink a day. Know how much alcohol is in your drink. In the [place] one drink equals one 12 oz bottle of beer (355 mL), one 5 oz glass of wine (148 mL), or one 1 oz glass of hard  liquor (44 mL). Lifestyle Brush your teeth every morning and night with fluoride toothpaste. Floss one time each day. Exercise for at least 30 minutes 5 or more days each week. Do not use any products that contain nicotine or  tobacco. These products include cigarettes, chewing tobacco, and vaping devices, such as e-cigarettes. If you need help quitting, ask your health care provider. Do not use drugs. If you are sexually active, practice safe sex. Use a condom or other form of protection to prevent STIs. If you do not wish to become pregnant, use a form of birth control. If you plan to become pregnant, see your health care provider for a prepregnancy visit. Take aspirin only as told by your health care provider. Make sure that you understand how much to take and what form to take. Work with your health care provider to find out whether it is safe and beneficial for you to take aspirin daily. Find healthy ways to manage stress, such as: Meditation, yoga, or listening to music. Journaling. Talking to a trusted person. Spending time with friends and family. Minimize exposure to UV radiation to reduce your risk of skin cancer. Safety Always wear your seat belt while driving or riding in a vehicle. Do not drive: If you have been drinking alcohol. Do not ride with someone who has been drinking. When you are tired or distracted. While texting. If you have been using any mind-altering substances or drugs. Wear a helmet and other protective equipment during sports activities. If you have firearms in your house, make sure you follow all gun safety procedures. Seek help if you have been physically or sexually abused. What's next? Visit your health care provider once a year for an annual wellness visit. Ask your health care provider how often you should have your eyes and teeth checked. Stay up to date on all vaccines. This information is not intended to replace advice given to you by your health care provider. Make sure you discuss any questions you have with your health care provider. Document Revised: 09/15/2020 Document Reviewed: 09/15/2020 Elsevier Patient Education  2024 [employer].

## 2023-04-26 NOTE — Progress Notes (Signed)
Patient: Jamie Hardin, Female    DOB: 01/26/1971, 53 y.o.   MRN: [id number] Jamie Berry, PA-C Visit Date: 04/26/2023  Today's Provider: Danelle Berry, PA-C   Chief Complaint  Patient presents with   Annual Exam   Subjective:   Annual physical exam:  Jamie Hardin is a 53 y.o. female who presents today for complete physical exam:  Exercise/Activity:  getting back into exercise  Diet/nutrition: healthy diet/diabetic diet, low carb, protein first veggies second Sleep:  poor- many decades poor sleep - managed with specialist - encouraged her to ask about dayvigo and maybe avoid ambien long term  Wt Readings from Last 10 Encounters:  04/26/23 273 lb (123.8 kg)  04/05/22 (!) 322 lb 11.2 oz (146.4 kg)  01/04/22 (!) 311 lb 11.2 oz (141.4 kg)  07/26/21 290 lb (131.5 kg)  05/30/21 290 lb (131.5 kg)  04/01/21 287 lb 12.8 oz (130.5 kg)  03/23/20 290 lb (131.5 kg)  03/15/20 294 lb 1.6 oz (133.4 kg)  03/07/20 280 lb (127 kg)  03/01/20 285 lb (129.3 kg)   BMI Readings from Last 5 Encounters:  04/26/23 45.43 kg/m  04/05/22 53.70 kg/m  01/04/22 51.87 kg/m  07/26/21 48.26 kg/m  05/30/21 48.26 kg/m   Through [facility]   SDOH Screenings   Food Insecurity: No Food Insecurity (04/26/2023)  Housing: Low Risk  (04/26/2023)  Transportation Needs: No Transportation Needs (04/26/2023)  Utilities: Not At Risk (04/26/2023)  Alcohol Screen: Low Risk  (04/05/2022)  Depression (PHQ2-9): Low Risk  (04/26/2023)  Financial Resource Strain: Low Risk  (04/26/2023)  Physical Activity: Insufficiently Active (04/26/2023)  Social Connections: Moderately Isolated (04/26/2023)  Stress: Stress Concern Present (04/26/2023)  Tobacco Use: Medium Risk (04/26/2023)  Health Literacy: Adequate Health Literacy (04/26/2023)     USPSTF grade A and B recommendations - reviewed and addressed today  Depression:  Phq 9 completed today by patient, was reviewed by me with patient in the room PHQ score is neg,  pt feels good, working with [facility]    04/26/2023    8:39 AM 05/05/2022    8:27 AM 04/05/2022    9:32 AM 04/05/2022    8:27 AM  PHQ 2/9 Scores  PHQ - 2 Score 0 0 0 0  PHQ- 9 Score 0 0 0       04/26/2023    8:39 AM 05/05/2022    8:27 AM 04/05/2022    9:32 AM 04/05/2022    8:27 AM 01/04/2022    8:22 AM  Depression screen PHQ 2/9  Decreased Interest 0 0 0 0 0  Down, Depressed, Hopeless 0 0 0 0 0  PHQ - 2 Score 0 0 0 0 0  Altered sleeping 0 0 0    Tired, decreased energy 0 0 0    Change in appetite 0 0 0    Feeling bad or failure about yourself  0 0 0    Trouble concentrating 0 0 0    Moving slowly or fidgety/restless 0 0 0    Suicidal thoughts 0 0 0    PHQ-9 Score 0 0 0      Alcohol screening: Flowsheet Row Office Visit from 04/05/2022 in [facility]  AUDIT-C Score 0       Immunizations and Health Maintenance: Health Maintenance  Topic Date Due   Zoster Vaccines- Shingrix (1 of 2) Never done   Cervical Cancer Screening (HPV/Pap Cotest)  Never done   Pneumococcal Vaccine 31-16 Years old (2  of 2 - PCV) 01/13/2017   COVID-19 Vaccine (3 - Moderna risk series) 05/07/2020   MAMMOGRAM  02/03/2022   Colonoscopy  05/30/2022   INFLUENZA VACCINE  11/02/2022   DTaP/Tdap/Td (2 - Tdap) 09/16/2026   Hepatitis C Screening  Completed   HIV Screening  Completed   HPV VACCINES  Aged Out    Hep C Screening: done  STD testing and prevention (HIV/chl/gon/syphilis):  see above, no additional testing desired by pt today  Intimate partner violence:  safe at home   Sexual History/Pain during Intercourse: Married, no concerns  Menstrual History/LMP/Abnormal Bleeding:  hot flashes 15 months ago and they resolved  No LMP recorded. Patient has had a hysterectomy. Endometriosis severe with 18 years ago   Incontinence Symptoms:   none  Breast cancer: Due march Last Mammogram: *see HM list above  Cervical cancer screening: n/a surgery   Osteoporosis:  34  when ovaries removed - recommended dexa screening earlier Discussion on osteoporosis per age, including high calcium and vitamin D supplementation, weight bearing exercises   Skin cancer:  Hx of skin CA -  NO Discussed atypical lesions   Colorectal cancer:   Colonoscopy is due   Discussed concerning signs and sx of CRC  Lung cancer:   Low Dose CT Chest recommended if Age 32-80 years, 20 pack-year currently smoking OR have quit w/in 15years. Patient does not qualify.    Social History   Tobacco Use   Smoking status: Former    Current packs/day: 0.00    Average packs/day: 1 pack/day for 20.0 years (20.0 ttl pk-yrs)    Types: Cigarettes    Start date: 04/04/1983    Quit date: 04/04/2003    Years since quitting: 20.0   Smokeless tobacco: Never  Vaping Use   Vaping status: Never Used  Substance Use Topics   Alcohol use: Yes    Comment: Very Rare   Drug use: No     Flowsheet Row Office Visit from 04/05/2022 in [facility]  AUDIT-C Score 0       Family History  Problem Relation Age of Onset   Fibromyalgia Mother    Diabetes Mother    Cancer Father        prostate   Diabetes Maternal Aunt    Diabetes Paternal Aunt    Scleroderma Maternal Grandmother    Heart disease Maternal Grandfather    Hypertension Neg Hx    Hyperlipidemia Neg Hx    Stroke Neg Hx    COPD Neg Hx      Blood pressure/Hypertension: BP Readings from Last 3 Encounters:  04/26/23 132/70  04/05/22 120/72  01/04/22 138/80    Weight/Obesity: Wt Readings from Last 3 Encounters:  04/26/23 273 lb (123.8 kg)  04/05/22 (!) 322 lb 11.2 oz (146.4 kg)  01/04/22 (!) 311 lb 11.2 oz (141.4 kg)   BMI Readings from Last 3 Encounters:  04/26/23 45.43 kg/m  04/05/22 53.70 kg/m  01/04/22 51.87 kg/m     Lipids:  Lab Results  Component Value Date   CHOL 175 04/05/2022   CHOL 142 01/16/2018   CHOL 175 01/15/2017   Lab Results  Component Value Date   HDL 59 04/05/2022   HDL 38  (L) 01/16/2018   HDL 64 01/15/2017   Lab Results  Component Value Date   LDLCALC 97 04/05/2022   LDLCALC 84 01/16/2018   LDLCALC 92 01/15/2017   Lab Results  Component Value Date   TRIG 94 04/05/2022  TRIG 104 01/16/2018   TRIG 91 01/15/2017   Lab Results  Component Value Date   CHOLHDL 3.0 04/05/2022   CHOLHDL 3.7 01/16/2018   CHOLHDL 2.7 01/15/2017   No results found for: "LDLDIRECT" Based on the results of lipid panel his/her cardiovascular risk factor ( using [facility] )  in the next 10 years is: The 10-year ASCVD risk score (Arnett DK, et al., 2019) is: 1%   Values used to calculate the score:     Age: 64 years     Sex: Female     Is Non-Hispanic African American: No     Diabetic: No     Tobacco smoker: No     Systolic Blood Pressure: 132 mmHg     Is BP treated: No     HDL Cholesterol: 59 mg/dL     Total Cholesterol: 175 mg/dL  Glucose:  Glucose  Date Value Ref Range Status  04/13/2012 95 65 - 99 mg/dL Final   Glucose, Bld  Date Value Ref Range Status  04/05/2022 84 65 - 99 mg/dL Final    Comment:    .            Fasting reference interval .   03/23/2020 95 70 - 99 mg/dL Final    Comment:    Glucose reference range applies only to samples taken after fasting for at least 8 hours.  06/16/2019 86 70 - 99 mg/dL Final    Comment:    Glucose reference range applies only to samples taken after fasting for at least 8 hours. Performed at [facility], [address], [place], [place] [address]     Advanced Care Planning:  A voluntary discussion about advance care planning including the explanation and discussion of advance directives.   Discussed health care proxy and Living will, and the patient was able to identify a health care proxy as spouse .     Social History       Social History   Socioeconomic History   Marital status: Married    Spouse name: Jillyn Hidden   Number of children: 3   Years of education: 14   Highest education level:  Associate degree: academic program  Occupational History   Not on file  Tobacco Use   Smoking status: Former    Current packs/day: 0.00    Average packs/day: 1 pack/day for 20.0 years (20.0 ttl pk-yrs)    Types: Cigarettes    Start date: 04/04/1983    Quit date: 04/04/2003    Years since quitting: 20.0   Smokeless tobacco: Never  Vaping Use   Vaping status: Never Used  Substance and Sexual Activity   Alcohol use: Yes    Comment: Very Rare   Drug use: No   Sexual activity: Yes    Birth control/protection: Surgical    Comment: Total hystertomy   Other Topics Concern   Not on file  Social History Narrative   Not on file   Social Drivers of Health   Financial Resource Strain: Low Risk  (04/26/2023)   Overall Financial Resource Strain (CARDIA)    Difficulty of Paying Living Expenses: Not hard at all  Food Insecurity: No Food Insecurity (04/26/2023)   Hunger Vital Sign    Worried About Running Out of Food in the Last Year: Never true    Ran Out of Food in the Last Year: Never true  Transportation Needs: No Transportation Needs (04/26/2023)   PRAPARE - Transportation    Lack of Transportation (  Medical): No    Lack of Transportation (Non-Medical): No  Physical Activity: Insufficiently Active (04/26/2023)   Exercise Vital Sign    Days of Exercise per Week: 1 day    Minutes of Exercise per Session: 20 min  Stress: Stress Concern Present (04/26/2023)   [employer] of Occupational Health - Occupational Stress Questionnaire    Feeling of Stress : Rather much  Social Connections: Moderately Isolated (04/26/2023)   Social Connection and Isolation Panel [NHANES]    Frequency of Communication with Friends and Family: More than three times a week    Frequency of Social Gatherings with Friends and Family: Three times a week    Attends Religious Services: Never    Active Member of Clubs or Organizations: No    Attends Engineer, structural: Not on file    Marital Status: Married     Family History        Family History  Problem Relation Age of Onset   Fibromyalgia Mother    Diabetes Mother    Cancer Father        prostate   Diabetes Maternal Aunt    Diabetes Paternal Aunt    Scleroderma Maternal Grandmother    Heart disease Maternal Grandfather    Hypertension Neg Hx    Hyperlipidemia Neg Hx    Stroke Neg Hx    COPD Neg Hx     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   Newly recognized heart murmur 08/29/2017   PAC (premature atrial contraction) 08/29/2017   DDD (degenerative disc disease), cervical 07/11/2017   Pain in joint involving ankle and foot 07/11/2017   Low back pain 07/11/2017   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   Influenza vaccination declined by patient 01/15/2017   Contusion of right knee 11/15/2016   Subluxation of shoulder joint 11/15/2016   Abnormal mammogram of both breasts 02/04/2016   History of pneumonia as indication for 23-polyvalent pneumococcal polysaccharide vaccine 01/14/2016   Preventative health care 01/14/2016   Facial pain, atypical 04/27/2015   Eustachian tube dysfunction 04/27/2015   Neoplasm of skin of neck 04/27/2015   Abnormal thyroid function test 02/15/2015   Rash 02/15/2015   Breast cancer screening 01/12/2015   Elevated blood-pressure reading without diagnosis of hypertension 01/12/2015   GAD (generalized anxiety disorder) 01/12/2015   OSA on CPAP 01/12/2015   Allergy    Asthma    Morbid obesity (HCC)    Dysthymic disorder    GERD (gastroesophageal reflux disease)    Fibromyalgia  07/22/2014   Intractable chronic migraine without aura and without status migrainosus 07/22/2014   Morbid obesity with BMI of 50.0-59.9, adult (HCC) 07/22/2014    Past Surgical History:  Procedure Laterality Date   ABDOMINAL HYSTERECTOMY  2007   complete due to endometriosis   CHOLECYSTECTOMY  01/2018   COLONOSCOPY WITH PROPOFOL N/A 05/30/2021   Procedure: COLONOSCOPY WITH PROPOFOL;  Surgeon: Wyline Mood, MD;  Location: [facility] ENDOSCOPY;  Service: Gastroenterology;  Laterality: N/A;   Laparoscopic sleeve gastrectomy  11/09/2017   NASAL SINUS SURGERY  1996 and 1999   x 2     Current Outpatient  Medications:    albuterol (ACCUNEB) 0.63 MG/3ML nebulizer solution, USE 1 VIAL VIA NEBULIZER EVERY 4 HOURS AS NEEDED FOR WHEEZING, Disp: 75 mL, Rfl: 3   albuterol (VENTOLIN HFA) 108 (90 Base) MCG/ACT inhaler, Inhale 2 puffs into the lungs every 6 (six) hours as needed for wheezing or shortness of breath., Disp: 18 g, Rfl: 2   amoxicillin-clavulanate (AUGMENTIN) 875-125 MG tablet, Take 1 tablet by mouth 2 (two) times daily., Disp: 20 tablet, Rfl: 0   beclomethasone (QVAR) 40 MCG/ACT inhaler, Inhale 2 puffs into the lungs 2 (two) times daily., Disp: 1 each, Rfl: 1   calcium-vitamin D 250-100 MG-UNIT tablet, Take 1 tablet by mouth 2 (two) times daily., Disp: , Rfl:    Erenumab-aooe (AIMOVIG) 70 MG/ML SOAJ, Inject 140 mg into the skin every 28 (twenty-eight) days., Disp: , Rfl:    gabapentin (NEURONTIN) 300 MG capsule, Take 300 mg by mouth 3 (three) times daily., Disp: , Rfl:    ibuprofen (ADVIL) 200 MG tablet, Take 1-2 tablets (200-400 mg total) by mouth every 6 (six) hours as needed., Disp: , Rfl:    ipratropium-albuterol (DUONEB) 0.5-2.5 (3) MG/3ML SOLN, Take 3 mLs by nebulization 3 (three) times daily as needed., Disp: 180 mL, Rfl: 1   Multiple Vitamins-Minerals (MULTIVITAMIN WITH MINERALS) tablet, Take 1 tablet by mouth daily., Disp: , Rfl:    ondansetron (ZOFRAN ODT) 8 MG disintegrating tablet, Take  1 tablet (8 mg total) by mouth every 8 (eight) hours as needed for nausea or vomiting., Disp: 20 tablet, Rfl: 0   pantoprazole (PROTONIX) 40 MG tablet, Take 40 mg by mouth daily., Disp: , Rfl:    SUMAtriptan (IMITREX) 50 MG tablet, Take 1-2 tablets (50-100 mg total) by mouth every 2 (two) hours as needed for migraine. May repeat in 2 hours if headache persists or recurs., Disp: 10 tablet, Rfl: 2   tiZANidine (ZANAFLEX) 4 MG tablet, Take 4 mg by mouth every 6 (six) hours as needed for muscle spasms., Disp: , Rfl:    valACYclovir (VALTREX) 1000 MG tablet, TAKE 2 TABLETS BY MOUTH AT FIRST SIGN OF OUTBREAK FOLLOWED BY 2 TABLETS MORE 12 HOURS LATER, Disp: 4 tablet, Rfl: 5   zolpidem (AMBIEN) 5 MG tablet, TAKE 1 TABLET(5 MG) BY MOUTH AT BEDTIME AS NEEDED FOR SLEEP, Disp: 30 tablet, Rfl: 1   clonazePAM (KLONOPIN) 0.5 MG tablet, Take 1 tablet (0.5 mg total) by mouth 2 (two) times daily as needed., Disp: 15 tablet, Rfl: 0  Allergies  Allergen Reactions   Naproxen Hives   Codeine Nausea And Vomiting    Patient Care Team: Jamie Berry, PA-C as PCP - General (Family Medicine)   Chart Review: I personally reviewed active problem list, medication list, allergies, family history, social history, health maintenance, notes from last encounter, lab results, imaging with the patient/caregiver today.   Review of Systems  Constitutional: Negative.   HENT: Negative.    Eyes: Negative.   Respiratory: Negative.    Cardiovascular: Negative.   Gastrointestinal: Negative.   Endocrine: Negative.   Genitourinary: Negative.   Musculoskeletal: Negative.   Skin: Negative.   Allergic/Immunologic: Negative.   Neurological: Negative.   Hematological: Negative.   Psychiatric/Behavioral: Negative.    All other systems reviewed and are negative.         Objective:   Vitals:  Vitals:   04/26/23 0841  BP: 132/70  Pulse: 74  Resp: 16  SpO2: 99%  Weight: 273 lb (123.8 kg)  Height: 5\' 5"  (1.651 m)  Body  mass index is 45.43 kg/m.  Physical Exam Vitals and nursing note reviewed.  Constitutional:      General: She is not in acute distress.    Appearance: Normal appearance. She is well-developed. She is obese. She is not ill-appearing, toxic-appearing or diaphoretic.  HENT:     Head: Normocephalic and atraumatic.     Right Ear: External ear normal.     Left Ear: External ear normal.     Nose: Nose normal.  Eyes:     General: No scleral icterus.       Right eye: No discharge.        Left eye: No discharge.     Conjunctiva/sclera: Conjunctivae normal.  Neck:     Trachea: No tracheal deviation.  Cardiovascular:     Rate and Rhythm: Normal rate and regular rhythm.     Pulses: Normal pulses.     Heart sounds: Normal heart sounds. No murmur heard.    No friction rub. No gallop.  Pulmonary:     Effort: Pulmonary effort is normal. No respiratory distress.     Breath sounds: Normal breath sounds. No stridor. No wheezing, rhonchi or rales.  Skin:    General: Skin is warm and dry.     Findings: No rash.  Neurological:     Mental Status: She is alert.     Motor: No abnormal muscle tone.     Coordination: Coordination normal.  Psychiatric:        Mood and Affect: Mood normal.        Behavior: Behavior normal.       Fall Risk:    04/26/2023    8:40 AM 05/05/2022    8:27 AM 04/05/2022    8:26 AM 01/04/2022    8:22 AM 04/01/2021    8:35 AM  Fall Risk   Falls in the past year? 0 0 0 0 0  Number falls in past yr: 0 0 0 0 0  Injury with Fall? 0 0 0 0 0  Risk for fall due to : No Fall Risks No Fall Risks     Follow up Falls prevention discussed Falls prevention discussed Falls evaluation completed Falls evaluation completed     Functional Status Survey:     Assessment & Plan:    CPE completed today  USPSTF grade A and B recommendations reviewed with patient; age-appropriate recommendations, preventive care, screening tests, etc discussed and encouraged; healthy living  encouraged; see AVS for patient education given to patient  Discussed importance of 150 minutes of physical activity weekly, AHA exercise recommendations given to pt in AVS/handout  Discussed importance of healthy diet:  eating lean meats and proteins, avoiding trans fats and saturated fats, avoid simple sugars and excessive carbs in diet, eat 6 servings of fruit/vegetables daily and drink plenty of water and avoid sweet beverages.    Recommended pt to do annual eye exam and routine dental exams/cleanings  Depression, alcohol, fall screening completed as documented above and per flowsheets  Advance Care planning information and packet discussed and offered today, encouraged pt to discuss with family members/spouse/partner/friends and complete Advanced directive packet and bring copy to office   Reviewed Health Maintenance: Health Maintenance  Topic Date Due   Zoster Vaccines- Shingrix (1 of 2) Never done   Cervical Cancer Screening (HPV/Pap Cotest)  Never done   Pneumococcal Vaccine 35-47 Years old (2 of 2 - PCV) 01/13/2017   COVID-19 Vaccine (3 - Moderna risk series) 05/07/2020   MAMMOGRAM  02/03/2022   Colonoscopy  05/30/2022   INFLUENZA VACCINE  11/02/2022   DTaP/Tdap/Td (2 - Tdap) 09/16/2026   Hepatitis C Screening  Completed   HIV Screening  Completed   HPV VACCINES  Aged Out    Immunizations: Immunization History  Administered Date(s) Administered   Influenza, Seasonal, Injecte, Preservative Fre 01/23/2013   Influenza,inj,Quad PF,6+ Mos 01/12/2015, 01/14/2016, 01/16/2018, 03/15/2020, 01/04/2022   Influenza-Unspecified 01/12/2015, 01/14/2016, 12/24/2018, 03/15/2020, 01/01/2021   Moderna SARS-COV2 Booster Vaccination 04/09/2020   Moderna Sars-Covid-2 Vaccination 06/02/2019, 06/27/2019   Pneumococcal Polysaccharide-23 02/13/2014, 01/14/2016   Td 09/15/2016   Td (Adult), 2 Lf Tetanus Toxid, Preservative Free 09/15/2016   Vaccines:  HPV: up to at age 12 , ask insurance if  age between 67-45  Shingrix: 20-64 yo and ask insurance if covered when patient above 50 yo Pneumonia:  educated and discussed with patient. Flu:  educated and discussed with patient. COVID:      ICD-10-CM   1. Adult general medical exam  Z00.00     2. OSA on CPAP  G47.33 Ambulatory referral to Pulmonology    3. Atrophic vaginitis  N95.2 estradiol (ESTRACE) 0.1 MG/GM vaginal cream   trial of vaginal estrogens, daily x 2 weeks then decrease to 2-3 x per week    4. Premature surgical menopause  E89.40    never offered HRT, higher risk for osteoporosis, discussed sx and tx    5. Need for shingles vaccine  Z23     6. Screening for malignant neoplasm of colon  Z12.11    overdue, she plans to do it next year    7. Postmenopausal estrogen deficiency  Z78.0    recommend dexa at [facility]    8. Encounter for screening mammogram for malignant neoplasm of breast  Z12.31    mammo due, she does at [facility], due in march    9. Class 3 severe obesity with body mass index (BMI) of 45.0 to 49.9 in adult, unspecified obesity type, unspecified whether serious comorbidity present (HCC)  W29.562    E66.01    Z68.42    hx of gastric sleeve, working with Bariatric specialist and on meds, loosing weight and doing well      [facility] for mammogram and dexa    Jamie Berry, PA-C 04/26/23 9:01 AM  [facility]

## 2023-05-02 DIAGNOSIS — G4733 Obstructive sleep apnea (adult) (pediatric): Secondary | ICD-10-CM | POA: Diagnosis not present

## 2023-05-15 ENCOUNTER — Encounter: Payer: Self-pay | Admitting: Sleep Medicine

## 2023-05-15 ENCOUNTER — Ambulatory Visit: Payer: BC Managed Care – PPO | Admitting: Sleep Medicine

## 2023-05-15 VITALS — BP 120/80 | HR 93 | Temp 97.1°F | Ht 65.0 in | Wt 272.6 lb

## 2023-05-15 DIAGNOSIS — F5104 Psychophysiologic insomnia: Secondary | ICD-10-CM

## 2023-05-15 DIAGNOSIS — G4733 Obstructive sleep apnea (adult) (pediatric): Secondary | ICD-10-CM

## 2023-05-15 DIAGNOSIS — F411 Generalized anxiety disorder: Secondary | ICD-10-CM | POA: Diagnosis not present

## 2023-05-15 NOTE — Progress Notes (Signed)
Name:Jamie Hardin MRN: [id number] DOB: [birth year]   CHIEF COMPLAINT:  Establish care for OSA   HISTORY OF PRESENT ILLNESS:  Jamie Hardin is a 52 y.o. w/ a h/o OSA, morbid obesity s/p bariatric surgery, asthma, GERD, anxiety and who presents to establish care for OSA. Reports that she was initially diagnosed with OSA several years ago and subsequently started on CPAP therapy. Reports using CPAP therapy every night, which is confirmed by compliance data. CPAP therapy is currently set to 8 cm H2O. She is currently using the Airfit P10 nasal pillow mask. Reports feeling unrefreshed upon awakening with CPAP therapy. States that she takes Ambien a few times per week. Denies snoring with CPAP therapy. Reports nocturnal awakenings due to unclear reasons, however does not have difficulty falling back to sleep. Reports weight fluctuations over the years.   Admits to morning headaches and dry mouth. Denies night sweats and RLS symptoms. Denies dream enactment, sleep walking or talking. Denies a family history of sleep apnea. Reports occasional drowsy driving. Drinks 1 cup of coffee daily and 1 soda daily, rare alcohol use, denies tobacco or illicit drug use.   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  has a past surgical history that includes Nasal sinus surgery (1996 and 1999); Abdominal hysterectomy (2007); Laparoscopic sleeve gastrectomy (11/09/2017); Cholecystectomy (01/2018); and Colonoscopy with propofol (N/A,  05/30/2021). Prior to Admission medications   Medication Sig Start Date End Date Taking? Authorizing Provider  albuterol (ACCUNEB) 0.63 MG/3ML nebulizer solution USE 1 VIAL VIA NEBULIZER EVERY 4 HOURS AS NEEDED FOR WHEEZING 09/16/18   Shane Crutch, MD  albuterol (VENTOLIN HFA) 108 (90 Base) MCG/ACT inhaler Inhale 2 puffs into the lungs every 6 (six) hours as needed for wheezing or shortness of breath. 03/15/20   Danelle Berry, PA-C  amoxicillin-clavulanate (AUGMENTIN) 875-125 MG tablet Take 1 tablet by mouth 2 (two) times daily. 03/26/23   Delorse Lek, FNP  beclomethasone (QVAR) 40 MCG/ACT inhaler Inhale 2 puffs into the lungs 2 (two) times daily. 03/15/20   Danelle Berry, PA-C  calcium-vitamin D 250-100 MG-UNIT tablet Take 1 tablet by mouth 2 (two) times daily.    [provider]  clonazePAM (KLONOPIN) 0.5 MG tablet Take 1 tablet (0.5 mg total) by mouth 2 (two) times daily as needed. 12/22/20   Caro Laroche, DO  Erenumab-aooe (AIMOVIG) 70 MG/ML SOAJ Inject 140 mg into the skin every 28 (twenty-eight) days. 11/26/19   [provider]  estradiol (ESTRACE) 0.1 MG/GM vaginal cream Place 1 Applicatorful vaginally at bedtime. 04/26/23   Danelle Berry, PA-C  gabapentin (NEURONTIN) 300 MG capsule Take 300 mg by mouth 3 (three) times daily.    [provider]  ibuprofen (ADVIL) 200 MG tablet Take 1-2 tablets (200-400 mg total) by mouth every 6 (six) hours as needed. 07/25/18   Lada, Janit Bern, MD  ipratropium-albuterol (DUONEB) 0.5-2.5 (3) MG/3ML SOLN Take 3 mLs by nebulization 3 (three) times daily as needed. 03/15/20   Danelle Berry,  PA-C  Multiple Vitamins-Minerals (MULTIVITAMIN WITH MINERALS) tablet Take 1 tablet by mouth daily.    [provider]  ondansetron (ZOFRAN ODT) 8 MG disintegrating tablet Take 1 tablet (8 mg total) by mouth every 8 (eight) hours as needed for nausea or vomiting. 03/01/20   Becky Augusta, NP  pantoprazole (PROTONIX) 40 MG tablet Take 40  mg by mouth daily.    [provider]  SUMAtriptan (IMITREX) 50 MG tablet Take 1-2 tablets (50-100 mg total) by mouth every 2 (two) hours as needed for migraine. May repeat in 2 hours if headache persists or recurs. 07/17/19   Danelle Berry, PA-C  tirzepatide (ZEPBOUND) 15 MG/0.5ML Pen Inject 15 mg into the skin once a week.    Luther Bradley, PA-C  tiZANidine (ZANAFLEX) 4 MG tablet Take 4 mg by mouth every 6 (six) hours as needed for muscle spasms.    [provider]  valACYclovir (VALTREX) 1000 MG tablet TAKE 2 TABLETS BY MOUTH AT FIRST SIGN OF OUTBREAK FOLLOWED BY 2 TABLETS MORE 12 HOURS LATER 07/11/22   Danelle Berry, PA-C  zolpidem (AMBIEN) 5 MG tablet TAKE 1 TABLET(5 MG) BY MOUTH AT BEDTIME AS NEEDED FOR SLEEP 09/07/22   Alba Cory, MD   Allergies  Allergen Reactions   Naproxen Hives   Codeine Nausea And Vomiting    FAMILY HISTORY:  family history includes Cancer in her father; Diabetes in her maternal aunt, mother, and paternal aunt; Fibromyalgia in her mother; Heart disease in her maternal grandfather; Scleroderma in her maternal grandmother. SOCIAL HISTORY:  reports that she quit smoking about 20 years ago. Her smoking use included cigarettes. She started smoking about 40 years ago. She has a 20 pack-year smoking history. She has never used smokeless tobacco. She reports current alcohol use. She reports that she does not use drugs.   Review of Systems:  Gen:  Denies  fever, sweats, chills weight loss  HEENT: Denies blurred vision, double vision, ear pain, eye pain, hearing loss, nose bleeds, sore throat Cardiac:  No dizziness, chest pain or heaviness, chest tightness,edema, No JVD Resp:   No cough, -sputum production, -shortness of breath,-wheezing, -hemoptysis,  Gi: Denies swallowing difficulty, stomach pain, nausea or vomiting, diarrhea, constipation, bowel incontinence Gu:  Denies bladder incontinence, burning urine Ext:   Denies Joint pain, stiffness or  swelling Skin: Denies  skin rash, easy bruising or bleeding or hives Endoc:  Denies polyuria, polydipsia , polyphagia or weight change Psych:   Denies depression, insomnia or hallucinations  Other:  All other systems negative  VITAL SIGNS: BP 120/80 (BP Location: Right Arm, Cuff Size: Large)   Pulse 93   Temp (!) 97.1 F (36.2 C)   Ht 5\' 5"  (1.651 m)   Wt 272 lb 9.6 oz (123.7 kg)   SpO2 97%   BMI 45.36 kg/m     Physical Examination:   General Appearance: No distress  EYES PERRLA, EOM intact.   NECK Supple, No JVD Throat Mallampati IV Pulmonary: normal breath sounds, No wheezing.  CardiovascularNormal S1,S2.  No m/r/g.   Abdomen: Benign, Soft, non-tender. Skin:   warm, no rashes, no ecchymosis  Extremities: normal, no cyanosis, clubbing. Neuro:without focal findings,  speech normal  PSYCHIATRIC: Mood, affect within normal limits.   ASSESSMENT AND PLAN  OSA Patient is using and benefiting from CPAP therapy. Completing order for new APAP device set to 4-12 cm H2O. Counseled patient on importance of using CPAP therapy every night. Discussed the consequences of untreated sleep apnea. Advised  not to drive drowsy for safety of patient and others. Will follow up in 6 months to review CPAP efficacy and compliance data.     Psychophysiologic insomnia Counseled patient on stimulus control and improving sleep hygiene practices. I suspect that anxiety is the underlying cause of insomnia.   Anxiety I suspect that anxiety is the underlying cause of insomnia. Recommended patient to discuss starting on Sertraline 25 mg with PCP.       Patient satisfied with Plan of action and management. All questions answered  Will follow up in 6 months to review CPAP efficacy and compliance data.    I spent a total of 47 minutes reviewing chart data, face-to-face evaluation with the patient, counseling and coordination of care as detailed above.    Tempie Hoist, M.D.  Sleep [facility] [place]  [facility]

## 2023-05-15 NOTE — Patient Instructions (Addendum)
Will complete order for new CPAP device set to 4-12 cm H2O. Do not drive drowsy for safety of yourself and others. Will follow up in 6 months.   To improve sleep hygiene, recommend reading "Say Goodnight to Insomnia".  Patient Instructions  Continue to use CPAP every night, minimum of 7-8 hours a night.  Change mask every 30 days or as directed by DME.  Wash your tubing with warm soap and water weekly, hang to dry. Wash humidifier portion weekly. Use distilled water and change daily.  Be aware of reduced alertness and do not drive or operate heavy machinery if experiencing this or drowsiness.  Exercise encouraged, as tolerated. Encouraged proper weight management.  Important to get eight or more hours of sleep  Limiting the use of the computer and television before bedtime.  Decrease naps during the day, so night time sleep will become enhanced.  Limit caffeine, and sleep deprivation.  HTN, stroke, uncontrolled diabetes and heart failure are potential risk factors.  Risk of untreated sleep apnea including cardiac arrhthymias, stroke, DM, pulm HTN.

## 2023-05-16 ENCOUNTER — Encounter: Payer: Self-pay | Admitting: Sleep Medicine

## 2023-05-21 DIAGNOSIS — F419 Anxiety disorder, unspecified: Secondary | ICD-10-CM | POA: Diagnosis not present

## 2023-06-01 DIAGNOSIS — G4733 Obstructive sleep apnea (adult) (pediatric): Secondary | ICD-10-CM | POA: Diagnosis not present

## 2023-06-01 DIAGNOSIS — F5101 Primary insomnia: Secondary | ICD-10-CM | POA: Diagnosis not present

## 2023-06-01 DIAGNOSIS — M797 Fibromyalgia: Secondary | ICD-10-CM | POA: Diagnosis not present

## 2023-06-01 DIAGNOSIS — G43719 Chronic migraine without aura, intractable, without status migrainosus: Secondary | ICD-10-CM | POA: Diagnosis not present

## 2023-06-01 DIAGNOSIS — G629 Polyneuropathy, unspecified: Secondary | ICD-10-CM | POA: Diagnosis not present

## 2023-07-08 DIAGNOSIS — G4733 Obstructive sleep apnea (adult) (pediatric): Secondary | ICD-10-CM | POA: Diagnosis not present

## 2023-07-10 DIAGNOSIS — F419 Anxiety disorder, unspecified: Secondary | ICD-10-CM | POA: Diagnosis not present

## 2023-07-18 DIAGNOSIS — F5104 Psychophysiologic insomnia: Secondary | ICD-10-CM | POA: Diagnosis not present

## 2023-07-18 DIAGNOSIS — F331 Major depressive disorder, recurrent, moderate: Secondary | ICD-10-CM | POA: Diagnosis not present

## 2023-07-18 DIAGNOSIS — F411 Generalized anxiety disorder: Secondary | ICD-10-CM | POA: Diagnosis not present

## 2023-08-07 DIAGNOSIS — G4733 Obstructive sleep apnea (adult) (pediatric): Secondary | ICD-10-CM | POA: Diagnosis not present

## 2023-08-09 DIAGNOSIS — Z713 Dietary counseling and surveillance: Secondary | ICD-10-CM | POA: Diagnosis not present

## 2023-08-09 DIAGNOSIS — Z9884 Bariatric surgery status: Secondary | ICD-10-CM | POA: Diagnosis not present

## 2023-08-09 DIAGNOSIS — K912 Postsurgical malabsorption, not elsewhere classified: Secondary | ICD-10-CM | POA: Diagnosis not present

## 2023-08-15 DIAGNOSIS — F5104 Psychophysiologic insomnia: Secondary | ICD-10-CM | POA: Diagnosis not present

## 2023-08-15 DIAGNOSIS — F331 Major depressive disorder, recurrent, moderate: Secondary | ICD-10-CM | POA: Diagnosis not present

## 2023-08-15 DIAGNOSIS — F411 Generalized anxiety disorder: Secondary | ICD-10-CM | POA: Diagnosis not present

## 2023-08-21 DIAGNOSIS — F419 Anxiety disorder, unspecified: Secondary | ICD-10-CM | POA: Diagnosis not present

## 2023-08-28 ENCOUNTER — Telehealth: Payer: Self-pay | Admitting: Family Medicine

## 2023-08-28 ENCOUNTER — Other Ambulatory Visit: Payer: Self-pay

## 2023-08-28 MED ORDER — VALACYCLOVIR HCL 1 G PO TABS: ORAL_TABLET | ORAL | 5 refills | Status: AC

## 2023-08-28 NOTE — Telephone Encounter (Signed)
 Refill sent to pharmacy.

## 2023-08-28 NOTE — Telephone Encounter (Signed)
valACYclovir (VALTREX) 1000 MG tablet  °

## 2023-09-07 DIAGNOSIS — G4733 Obstructive sleep apnea (adult) (pediatric): Secondary | ICD-10-CM | POA: Diagnosis not present

## 2023-09-14 DIAGNOSIS — G4733 Obstructive sleep apnea (adult) (pediatric): Secondary | ICD-10-CM | POA: Diagnosis not present

## 2023-10-23 DIAGNOSIS — F411 Generalized anxiety disorder: Secondary | ICD-10-CM | POA: Diagnosis not present

## 2023-10-23 DIAGNOSIS — F331 Major depressive disorder, recurrent, moderate: Secondary | ICD-10-CM | POA: Diagnosis not present

## 2023-10-23 DIAGNOSIS — F5104 Psychophysiologic insomnia: Secondary | ICD-10-CM | POA: Diagnosis not present

## 2023-12-13 DIAGNOSIS — G4733 Obstructive sleep apnea (adult) (pediatric): Secondary | ICD-10-CM | POA: Diagnosis not present

## 2024-01-08 DIAGNOSIS — Z1231 Encounter for screening mammogram for malignant neoplasm of breast: Secondary | ICD-10-CM | POA: Diagnosis not present

## 2024-01-14 DIAGNOSIS — Z9884 Bariatric surgery status: Secondary | ICD-10-CM | POA: Diagnosis not present

## 2024-01-14 DIAGNOSIS — Z713 Dietary counseling and surveillance: Secondary | ICD-10-CM | POA: Diagnosis not present

## 2024-01-14 DIAGNOSIS — K912 Postsurgical malabsorption, not elsewhere classified: Secondary | ICD-10-CM | POA: Diagnosis not present

## 2024-01-16 DIAGNOSIS — F5104 Psychophysiologic insomnia: Secondary | ICD-10-CM | POA: Diagnosis not present

## 2024-01-30 DIAGNOSIS — E6609 Other obesity due to excess calories: Secondary | ICD-10-CM | POA: Diagnosis not present

## 2024-02-06 DIAGNOSIS — H04123 Dry eye syndrome of bilateral lacrimal glands: Secondary | ICD-10-CM | POA: Diagnosis not present

## 2024-02-06 DIAGNOSIS — H0012 Chalazion right lower eyelid: Secondary | ICD-10-CM | POA: Diagnosis not present

## 2024-02-12 DIAGNOSIS — G4733 Obstructive sleep apnea (adult) (pediatric): Secondary | ICD-10-CM | POA: Diagnosis not present

## 2024-04-28 ENCOUNTER — Encounter: Payer: Self-pay | Admitting: Family Medicine
